# Patient Record
Sex: Female | Born: 1985
Health system: Southern US, Community
[De-identification: ages and names within clinical notes are randomized; demographics above are authoritative.]

## PROBLEM LIST (undated history)

## (undated) DIAGNOSIS — F419 Anxiety disorder, unspecified: Secondary | ICD-10-CM

## (undated) DIAGNOSIS — Z5189 Encounter for other specified aftercare: Secondary | ICD-10-CM

## (undated) DIAGNOSIS — J189 Pneumonia, unspecified organism: Secondary | ICD-10-CM

## (undated) DIAGNOSIS — D649 Anemia, unspecified: Secondary | ICD-10-CM

## (undated) DIAGNOSIS — F329 Major depressive disorder, single episode, unspecified: Secondary | ICD-10-CM

## (undated) DIAGNOSIS — Z1371 Encounter for nonprocreative screening for genetic disease carrier status: Secondary | ICD-10-CM

## (undated) DIAGNOSIS — N281 Cyst of kidney, acquired: Secondary | ICD-10-CM

## (undated) DIAGNOSIS — R011 Cardiac murmur, unspecified: Secondary | ICD-10-CM

## (undated) DIAGNOSIS — F32A Depression, unspecified: Secondary | ICD-10-CM

## (undated) DIAGNOSIS — T7840XA Allergy, unspecified, initial encounter: Secondary | ICD-10-CM

## (undated) DIAGNOSIS — Z1379 Encounter for other screening for genetic and chromosomal anomalies: Secondary | ICD-10-CM

## (undated) DIAGNOSIS — Z803 Family history of malignant neoplasm of breast: Secondary | ICD-10-CM

## (undated) DIAGNOSIS — Z9189 Other specified personal risk factors, not elsewhere classified: Secondary | ICD-10-CM

## (undated) DIAGNOSIS — M2420 Disorder of ligament, unspecified site: Secondary | ICD-10-CM

## (undated) HISTORY — DX: Family history of malignant neoplasm of breast: Z80.3

## (undated) HISTORY — DX: Other specified personal risk factors, not elsewhere classified: Z91.89

## (undated) HISTORY — DX: Encounter for other screening for genetic and chromosomal anomalies: Z13.79

## (undated) HISTORY — DX: Anxiety disorder, unspecified: F41.9

## (undated) HISTORY — DX: Depression, unspecified: F32.A

## (undated) HISTORY — DX: Allergy, unspecified, initial encounter: T78.40XA

## (undated) HISTORY — PX: DILATION AND CURETTAGE OF UTERUS: SHX78

## (undated) HISTORY — DX: Encounter for nonprocreative screening for genetic disease carrier status: Z13.71

## (undated) HISTORY — DX: Encounter for other specified aftercare: Z51.89

## (undated) HISTORY — DX: Cardiac murmur, unspecified: R01.1

## (undated) HISTORY — DX: Major depressive disorder, single episode, unspecified: F32.9

## (undated) HISTORY — PX: TUBAL LIGATION: SHX77

---
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
## 2007-02-19 ENCOUNTER — Emergency Department: Payer: Self-pay | Admitting: Internal Medicine

## 2007-09-11 ENCOUNTER — Emergency Department (HOSPITAL_COMMUNITY): Admission: EM | Admit: 2007-09-11 | Discharge: 2007-09-11 | Payer: Self-pay | Admitting: Emergency Medicine

## 2007-12-10 ENCOUNTER — Emergency Department: Payer: Self-pay | Admitting: Emergency Medicine

## 2008-06-15 ENCOUNTER — Observation Stay: Payer: Self-pay

## 2008-07-20 ENCOUNTER — Observation Stay: Payer: Self-pay | Admitting: Obstetrics and Gynecology

## 2008-07-24 ENCOUNTER — Inpatient Hospital Stay: Payer: Self-pay | Admitting: Unknown Physician Specialty

## 2009-06-15 ENCOUNTER — Emergency Department: Payer: Self-pay | Admitting: Emergency Medicine

## 2010-02-23 ENCOUNTER — Emergency Department: Payer: Self-pay | Admitting: Emergency Medicine

## 2010-04-27 ENCOUNTER — Ambulatory Visit: Payer: Self-pay | Admitting: Internal Medicine

## 2010-05-25 ENCOUNTER — Ambulatory Visit: Payer: Self-pay | Admitting: Unknown Physician Specialty

## 2010-05-29 ENCOUNTER — Emergency Department: Payer: Self-pay | Admitting: Emergency Medicine

## 2010-07-19 ENCOUNTER — Emergency Department: Payer: Self-pay | Admitting: Emergency Medicine

## 2010-08-01 ENCOUNTER — Encounter: Payer: Self-pay | Admitting: Cardiovascular Disease

## 2010-08-01 ENCOUNTER — Ambulatory Visit
Admission: RE | Admit: 2010-08-01 | Discharge: 2010-08-01 | Payer: Self-pay | Source: Home / Self Care | Attending: Cardiovascular Disease | Admitting: Cardiovascular Disease

## 2010-08-01 DIAGNOSIS — R079 Chest pain, unspecified: Secondary | ICD-10-CM | POA: Insufficient documentation

## 2010-08-01 DIAGNOSIS — R002 Palpitations: Secondary | ICD-10-CM | POA: Insufficient documentation

## 2010-08-09 ENCOUNTER — Ambulatory Visit: Payer: Self-pay

## 2010-08-16 NOTE — Assessment & Plan Note (Signed)
Summary: CHEST PAINS/SAB   Visit Type:  Initial Consult Primary Provider:  None  CC:  c/o chest pressure with shortness of breath, bad headaches, nausea, and rapid pounding heart beats.  Has experienced these sypmtoms for about one month; recently for two weeks seems worse.  She feels very exhausted all the time everyday.Ebony Lewis  History of Present Illness: Ebony Lewis is a very pleasant 25 year old woman with history of general anxiety disorder, who presents for evaluation of sharp chest pain and palpitations.  She reports that over the past 2 weeks, she has felt symptoms of sharp stabbing sensations in her left upper chest. It is left of the mediastinum. It has been coming every other night, comes on rapidly, lasting for 10 minutes at a time. It is better after she lays down. She typically feels somewhat nauseous afterwards. She also has palpitations during the periods of chest pain which she describes as a hard beat with a pause followed by more hard beats.  She has had fatigue and headaches for one month. She is otherwise active with no symptoms with exertion. She has no symptoms to the daytime, only in the late afternoon and evening. She has not had symptoms like this before. She is worried that she might have a blood clot from being on birth control pills. She does smoke and has smoked for at least 6 years. She has a 103-year-old child. She reports that her sleep is good and not broken. No significant increase in stress to the past month.  EKG shows normal sinus rhythm with rate 85 beats per minute, no significant ST or T wave changes  Preventive Screening-Counseling & Management  Alcohol-Tobacco     Smoking Status: current  Current Medications (verified): 1)  Ventolin Hfa 108 (90 Base) Mcg/act Aers (Albuterol Sulfate) .... As Needed 2)  Prednisone 10 Mg Tabs (Prednisone) .... Taper Daily For Bronchitis 3)  Ortho Tri-Cyclen (28) 0.18/0.215/0.25 Mg-35 Mcg Tabs (Norgestim-Eth Estrad Triphasic)  .... Daily  Allergies (verified): No Known Drug Allergies  Past History:  Family History: Last updated: 08/01/2010 Father: Living; heart murmur, hypertension, irreg. heart beat, anxiety. 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 Started age 5 yrs old. Smokes 5-7 cigarettes weekly.  Risk Factors: Smoking Status: current (08/01/2010)  Past Medical History: asthma  Family History: Father: Living; heart murmur, hypertension, irreg. heart beat, anxiety. Mother: Living;   Maternal grandmother: CAD; fibromyalgia  Social History: Student -- Designer, multimedia; Engineer, site Program Part Time  Tobacco Use - Yes. Started age 39 yrs old. Smokes 5-7 cigarettes weekly. Smoking Status:  current  Review of Systems       The patient complains of chest pain.  The patient denies fever, weight loss, weight gain, vision loss, decreased hearing, hoarseness, syncope, dyspnea on exertion, peripheral edema, prolonged cough, abdominal pain, incontinence, muscle weakness, depression, and enlarged lymph nodes.         palpitations  Vital Signs:  Patient profile:   25 year old female Height:      69 inches Weight:      200 pounds BMI:     29.64 Pulse rate:   85 / minute BP sitting:   114 / 78  (left arm) Cuff size:   regular  Vitals Entered By: Bishop Dublin, CMA (August 01, 2010 4:00 PM)  Physical Exam  General:  Well developed, well nourished, in  no acute distress. Head:  normocephalic and atraumatic Neck:  Neck supple, no JVD. No masses, thyromegaly or abnormal cervical nodes. Lungs:  Clear bilaterally to auscultation and percussion. Heart:  Non-displaced PMI, chest non-tender; regular rate and rhythm, S1, S2 without murmurs, rubs or gallops. Carotid upstroke normal, no bruit. Pedals normal pulses. No edema, no  varicosities. Abdomen:  Bowel sounds positive; abdomen soft and non-tender without masses Msk:  Back normal, normal gait. Muscle strength and tone normal. Pulses:  pulses normal in all 4 extremities Extremities:  No clubbing or cyanosis. Neurologic:  Alert and oriented x 3. Skin:  Intact without lesions or rashes. Psych:  Normal affect.   Impression & Recommendations:  Problem # 1:  CHEST PAIN-UNSPECIFIED (ICD-786.50) etiology for her chest pain is likely not secondary to underlying coronary artery disease as she is young, few risk factors. She has been doing well until 2 weeks ago. Symptoms come on at rest, not worse with exertion. Symptoms last for a brief period of time concerning for spasm. We have suggested she try sublingual nitroglycerin. We did offer long-acting nitroglycerin, calcium channel blockers, beta blockers but she is not interested in these medications. No further testing ordered.  Her updated medication list for this problem includes:    Nitrostat 0.4 Mg Subl (Nitroglycerin) .Ebony Lewis... 1 tablet under tongue at onset of chest pain; you may repeat every 5 minutes for up to 3 doses.  Problem # 2:  PALPITATIONS (ICD-785.1) She does have palpitations that sound consistent with ectopy/PVCs or APCs when she has her chest discomfort. These are likely benign. We did offer beta blockers, estrogen blockers and she is not interested at this time.  Her updated medication list for this problem includes:    Nitrostat 0.4 Mg Subl (Nitroglycerin) .Ebony Lewis... 1 tablet under tongue at onset of chest pain; you may repeat every 5 minutes for up to 3 doses.  Patient Instructions: 1)  Your physician recommends that you follow up as needed. Call our office if symptoms have not improved in 2 weeks. 2)  Your physician has recommended you make the following change in your medication: START Nitro-stat 0.4mg  SL as needed. Prescriptions: NITROSTAT 0.4 MG SUBL (NITROGLYCERIN) 1 tablet under tongue at onset  of chest pain; you may repeat every 5 minutes for up to 3 doses.  #25 x 3   Entered by:   Lanny Hurst RN   Authorized by:   Dossie Arbour MD   Signed by:   Lanny Hurst RN on 08/01/2010   Method used:   Electronically to        [facility]  [address] [phone].* (retail)       [address]       [place], [place]  [address]       Ph: [phone] or [phone]       Fax: [phone]   RxID:   [id number]

## 2011-01-30 ENCOUNTER — Encounter: Payer: Self-pay | Admitting: Cardiovascular Disease

## 2011-04-05 LAB — POCT PREGNANCY, URINE
Operator id: 29459
Preg Test, Ur: NEGATIVE

## 2011-04-05 LAB — BASIC METABOLIC PANEL
BUN: 7
CO2: 25
Calcium: 9
Chloride: 109
Creatinine, Ser: 0.77
GFR calc Af Amer: >60
GFR calc non Af Amer: >60
Glucose, Bld: 109 — ABNORMAL HIGH
Potassium: 3.3 — ABNORMAL LOW
Sodium: 140

## 2011-04-05 LAB — RAPID URINE DRUG SCREEN, HOSP PERFORMED
Amphetamines: NOT DETECTED
Barbiturates: NOT DETECTED
Benzodiazepines: POSITIVE — AB
Cocaine: NOT DETECTED
Opiates: NOT DETECTED
Tetrahydrocannabinol: POSITIVE — AB

## 2011-04-05 LAB — ETHANOL: Alcohol, Ethyl (B): 218 — ABNORMAL HIGH

## 2012-05-25 ENCOUNTER — Ambulatory Visit: Payer: Self-pay | Admitting: Family Medicine

## 2012-06-17 ENCOUNTER — Emergency Department: Payer: Self-pay | Admitting: Emergency Medicine

## 2012-07-09 ENCOUNTER — Encounter (HOSPITAL_COMMUNITY): Payer: Self-pay | Admitting: *Deleted

## 2012-07-09 ENCOUNTER — Emergency Department (HOSPITAL_COMMUNITY): Payer: Commercial Indemnity

## 2012-07-09 ENCOUNTER — Emergency Department (HOSPITAL_COMMUNITY)
Admission: EM | Admit: 2012-07-09 | Discharge: 2012-07-10 | Disposition: A | Discharge status: Home / Self Care | Payer: Commercial Indemnity | Attending: Emergency Medicine | Admitting: Emergency Medicine

## 2012-07-09 ENCOUNTER — Emergency Department: Payer: Self-pay | Admitting: Emergency Medicine

## 2012-07-09 DIAGNOSIS — F17200 Nicotine dependence, unspecified, uncomplicated: Secondary | ICD-10-CM | POA: Insufficient documentation

## 2012-07-09 DIAGNOSIS — J45909 Unspecified asthma, uncomplicated: Secondary | ICD-10-CM

## 2012-07-09 DIAGNOSIS — Z8701 Personal history of pneumonia (recurrent): Secondary | ICD-10-CM | POA: Insufficient documentation

## 2012-07-09 DIAGNOSIS — Z331 Pregnant state, incidental: Secondary | ICD-10-CM | POA: Insufficient documentation

## 2012-07-09 DIAGNOSIS — Z79899 Other long term (current) drug therapy: Secondary | ICD-10-CM | POA: Insufficient documentation

## 2012-07-09 DIAGNOSIS — R6883 Chills (without fever): Secondary | ICD-10-CM | POA: Insufficient documentation

## 2012-07-09 DIAGNOSIS — Z8709 Personal history of other diseases of the respiratory system: Secondary | ICD-10-CM | POA: Insufficient documentation

## 2012-07-09 DIAGNOSIS — IMO0002 Reserved for concepts with insufficient information to code with codable children: Secondary | ICD-10-CM | POA: Insufficient documentation

## 2012-07-09 MED ORDER — ALBUTEROL SULFATE (5 MG/ML) 0.5% IN NEBU
5.0000 mg | INHALATION_SOLUTION | Freq: Once | RESPIRATORY_TRACT | Status: AC
  Administered 2012-07-09: 5 mg via RESPIRATORY_TRACT
  Filled 2012-07-09: qty 1

## 2012-07-09 MED ORDER — IPRATROPIUM BROMIDE 0.02 % IN SOLN
0.5000 mg | Freq: Once | RESPIRATORY_TRACT | Status: AC
  Administered 2012-07-09: 0.5 mg via RESPIRATORY_TRACT
  Filled 2012-07-09: qty 2.5

## 2012-07-09 NOTE — ED Notes (Signed)
She has had a cold since last pm.  She is an asthmatic .  She is 7 weeks preg.  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

## 2012-07-09 NOTE — ED Notes (Signed)
Pt ambulatory to desk, asking about wait, informed about current wait, pt states she cannot wait much longer and she cannot breath, no audible wheezing noted, pt has been given breathing treatments x2 for symptoms, pt speaking in full sentences, skin warm and dry.

## 2012-07-10 LAB — POCT I-STAT 3, ART BLOOD GAS (G3+)
Acid-base deficit: 7 mmol/L — ABNORMAL HIGH (ref 0.0–2.0)
Bicarbonate: 16.6 mEq/L — ABNORMAL LOW (ref 20.0–24.0)
O2 Saturation: 97 %
TCO2: 17 mmol/L (ref 0–100)
pCO2 arterial: 27.4 mmHg — ABNORMAL LOW (ref 35.0–45.0)
pH, Arterial: 7.39 (ref 7.350–7.450)
pO2, Arterial: 91 mmHg (ref 80.0–100.0)

## 2012-07-10 MED ORDER — SODIUM CHLORIDE 0.9 % IV BOLUS (SEPSIS)
1000.0000 mL | Freq: Once | INTRAVENOUS | Status: AC
  Administered 2012-07-10: 1000 mL via INTRAVENOUS

## 2012-07-10 MED ORDER — MAGNESIUM SULFATE 40 MG/ML IJ SOLN
2.0000 g | Freq: Once | INTRAMUSCULAR | Status: AC
  Administered 2012-07-10: 2 g via INTRAVENOUS
  Filled 2012-07-10: qty 50

## 2012-07-10 MED ORDER — ALBUTEROL SULFATE (5 MG/ML) 0.5% IN NEBU
2.5000 mg | INHALATION_SOLUTION | Freq: Once | RESPIRATORY_TRACT | Status: AC
  Administered 2012-07-10: 2.5 mg via RESPIRATORY_TRACT
  Filled 2012-07-10: qty 20

## 2012-07-10 MED ORDER — ALBUTEROL SULFATE HFA 108 (90 BASE) MCG/ACT IN AERS: 1.0000 | INHALATION_SPRAY | Freq: Four times a day (QID) | RESPIRATORY_TRACT | Status: DC | PRN

## 2012-07-10 MED ORDER — AZITHROMYCIN 250 MG PO TABS: 250.0000 mg | ORAL_TABLET | Freq: Every day | ORAL | Status: DC

## 2012-07-10 MED ORDER — ALBUTEROL SULFATE (5 MG/ML) 0.5% IN NEBU
5.0000 mg | INHALATION_SOLUTION | Freq: Once | RESPIRATORY_TRACT | Status: AC
  Administered 2012-07-10: 5 mg via RESPIRATORY_TRACT
  Filled 2012-07-10: qty 20

## 2012-07-10 MED ORDER — DEXAMETHASONE SODIUM PHOSPHATE 10 MG/ML IJ SOLN
10.0000 mg | Freq: Once | INTRAMUSCULAR | Status: AC
  Administered 2012-07-10: 10 mg via INTRAVENOUS
  Filled 2012-07-10: qty 1

## 2012-07-10 MED ORDER — METHYLPREDNISOLONE SODIUM SUCC 125 MG IJ SOLR: 125.0000 mg | Freq: Once | INTRAMUSCULAR | Status: DC

## 2012-07-10 NOTE — ED Notes (Addendum)
Pt returned to desk requesting another breathing treatment, audible wheezing noted, triage RN informed, pt to have next room.

## 2012-07-10 NOTE — ED Provider Notes (Addendum)
History     CSN: [id number]  Arrival date & time 07/09/12  2029   First MD Initiated Contact with Patient 07/09/12 2354      Chief Complaint  Patient presents with  . Cough    (Consider location/radiation/quality/duration/timing/severity/associated sxs/prior treatment) Patient is a 26 y.o. female presenting with cough. The history is provided by the patient.  Cough This is a new problem. The current episode started yesterday. The problem occurs constantly. The problem has been gradually worsening. The cough is non-productive. There has been no fever. Associated symptoms include chills. Pertinent negatives include no sweats and no weight loss. She has tried nothing for the symptoms. She is not a smoker. Her past medical history is significant for bronchitis and pneumonia.    Past Medical History  Diagnosis Date  . Asthma     History reviewed. No pertinent past surgical history.  Family History  Problem Relation Age of Onset  . Heart murmur Father   . Hypertension Father   . Anxiety disorder Father   . Coronary artery disease Maternal Grandmother   . Fibromyalgia Maternal Grandmother     History  Substance Use Topics  . Smoking status: Current Some Day Smoker  . Smokeless tobacco: Not on file     Comment: Started age 47. Smokes 5-7 cigs weekly   . Alcohol Use: No    OB History    Grav Para Term Preterm Abortions TAB SAB Ect Mult Living   1               Review of Systems  Constitutional: Positive for chills. Negative for weight loss.  Respiratory: Positive for cough.   All other systems reviewed and are negative.    Allergies  Review of patient's allergies indicates not on file.  Home Medications   Current Outpatient Rx  Name  Route  Sig  Dispense  Refill  . ALBUTEROL SULFATE HFA 108 (90 BASE) MCG/ACT IN AERS   Inhalation   Inhale 2 puffs into the lungs as needed.           Marland Kitchen NITROGLYCERIN 0.4 MG SL SUBL   Sublingual   Place 0.4 mg under the  tongue every 5 (five) minutes as needed.           Ebony Lewis ESTRAD TRIPHASIC 0.18/0.215/0.25 MG-35 MCG PO TABS   Oral   Take by mouth daily.           Marland Kitchen PREDNISONE 10 MG PO TABS   Oral   Take 10 mg by mouth daily. For bronchitis              BP 142/79  Pulse 121  Temp 98.6 F (37 C) (Oral)  Resp 20  SpO2 100%  Physical Exam  Constitutional: She is oriented to person, place, and time. She appears well-developed and well-nourished.  HENT:  Head: Normocephalic and atraumatic.  Eyes: Conjunctivae normal and EOM are normal. Pupils are equal, round, and reactive to light.  Neck: Normal range of motion.  Cardiovascular: Normal rate, regular rhythm and normal heart sounds.   Pulmonary/Chest: Effort normal. No respiratory distress. She has wheezes. She has no rales. She exhibits no tenderness.  Abdominal: Soft. Bowel sounds are normal.  Musculoskeletal: Normal range of motion.  Neurological: She is alert and oriented to person, place, and time.  Skin: Skin is warm and dry.  Psychiatric: She has a normal mood and affect. Her behavior is normal.    ED Course  Procedures (including critical  care time)  Labs Reviewed - No data to display Dg Chest 2 View  07/09/2012  *RADIOLOGY REPORT*  Clinical Data: 26 year old female with cough and shortness of breath.  CHEST - 2 VIEW  Comparison: None  Findings: The cardiomediastinal silhouette is unremarkable. Mild peribronchial thickening is noted. There is no evidence of focal airspace disease, pulmonary edema, suspicious pulmonary nodule/mass, pleural effusion, or pneumothorax. No acute bony abnormalities are identified.  IMPRESSION: Mild peribronchial thickening / bronchitis of uncertain chronicity - no evidence of pneumonia.   Original Report Authenticated By: Harmon Pier, M.D.      No diagnosis found.    MDM  + asthma,  Will steroids,  Mag, ibr,  Reassess.  CXR neg.  Pt is pregnant.      Improved.  Will dc to oupt fu,   Ret new/worsening sxs    Ebony Kristiansen Lytle Michaels, MD 07/10/12 0013  Ebony Fogg Lytle Michaels, MD 07/10/12 2130

## 2012-07-11 ENCOUNTER — Emergency Department: Payer: Self-pay | Admitting: Emergency Medicine

## 2012-07-11 LAB — RAPID INFLUENZA A&B ANTIGENS

## 2013-02-18 ENCOUNTER — Observation Stay: Payer: Self-pay | Admitting: Advanced Practice Midwife

## 2013-02-23 ENCOUNTER — Observation Stay: Payer: Self-pay | Admitting: Obstetrics and Gynecology

## 2013-02-26 ENCOUNTER — Inpatient Hospital Stay: Payer: Self-pay | Admitting: Obstetrics and Gynecology

## 2013-02-26 LAB — CBC WITH DIFFERENTIAL/PLATELET
Basophil #: 0.1 10*3/uL (ref 0.0–0.1)
Basophil %: 0.5 %
Eosinophil #: 0.7 10*3/uL (ref 0.0–0.7)
Eosinophil %: 5.4 %
HCT: 36.2 % (ref 35.0–47.0)
HGB: 12.6 g/dL (ref 12.0–16.0)
Lymphocyte #: 2.7 10*3/uL (ref 1.0–3.6)
Lymphocyte %: 20.9 %
MCH: 31.6 pg (ref 26.0–34.0)
MCHC: 34.8 g/dL (ref 32.0–36.0)
MCV: 91 fL (ref 80–100)
Monocyte #: 1.3 x10 3/mm — ABNORMAL HIGH (ref 0.2–0.9)
Monocyte %: 9.8 %
Neutrophil #: 8.1 10*3/uL — ABNORMAL HIGH (ref 1.4–6.5)
Neutrophil %: 63.4 %
Platelet: 216 10*3/uL (ref 150–440)
RBC: 3.99 10*6/uL (ref 3.80–5.20)
RDW: 13.1 % (ref 11.5–14.5)
WBC: 12.8 10*3/uL — ABNORMAL HIGH (ref 3.6–11.0)

## 2013-02-26 LAB — HEMATOCRIT
HCT: 30.1 % — ABNORMAL LOW (ref 35.0–47.0)
HCT: 22.2 % — ABNORMAL LOW (ref 35.0–47.0)

## 2013-02-27 LAB — CBC WITH DIFFERENTIAL/PLATELET
Basophil #: 0 10*3/uL (ref 0.0–0.1)
Basophil %: 0.2 %
Eosinophil #: 0 10*3/uL (ref 0.0–0.7)
Eosinophil %: 0.1 %
HCT: 23.5 % — ABNORMAL LOW (ref 35.0–47.0)
HGB: 8.4 g/dL — ABNORMAL LOW (ref 12.0–16.0)
Lymphocyte #: 2.2 10*3/uL (ref 1.0–3.6)
Lymphocyte %: 11.5 %
MCH: 31.3 pg (ref 26.0–34.0)
MCHC: 35.6 g/dL (ref 32.0–36.0)
MCV: 88 fL (ref 80–100)
Monocyte #: 1.6 x10 3/mm — ABNORMAL HIGH (ref 0.2–0.9)
Monocyte %: 8.2 %
Neutrophil #: 15.6 10*3/uL — ABNORMAL HIGH (ref 1.4–6.5)
Neutrophil %: 80 %
Platelet: 163 10*3/uL (ref 150–440)
RBC: 2.68 10*6/uL — ABNORMAL LOW (ref 3.80–5.20)
RDW: 14.6 % — ABNORMAL HIGH (ref 11.5–14.5)
WBC: 19.5 10*3/uL — ABNORMAL HIGH (ref 3.6–11.0)

## 2013-02-28 LAB — HEMATOCRIT: HCT: 20.8 % — ABNORMAL LOW (ref 35.0–47.0)

## 2013-03-01 LAB — PATHOLOGY REPORT

## 2013-03-02 LAB — HEMATOCRIT: HCT: 26.3 % — ABNORMAL LOW (ref 35.0–47.0)

## 2013-04-18 ENCOUNTER — Emergency Department: Payer: Self-pay | Admitting: Emergency Medicine

## 2013-04-18 LAB — RAPID INFLUENZA A&B ANTIGENS

## 2013-04-19 LAB — COMPREHENSIVE METABOLIC PANEL
Albumin: 4.1 g/dL (ref 3.4–5.0)
Alkaline Phosphatase: 137 U/L — ABNORMAL HIGH (ref 50–136)
Anion Gap: 9 (ref 7–16)
BUN: 9 mg/dL (ref 7–18)
Bilirubin,Total: 0.2 mg/dL (ref 0.2–1.0)
Calcium, Total: 9 mg/dL (ref 8.5–10.1)
Chloride: 111 mmol/L — ABNORMAL HIGH (ref 98–107)
Co2: 23 mmol/L (ref 21–32)
Creatinine: 0.86 mg/dL (ref 0.60–1.30)
EGFR (African American): >60
EGFR (Non-African Amer.): >60
Glucose: 81 mg/dL (ref 65–99)
Osmolality: 283 (ref 275–301)
Potassium: 3.7 mmol/L (ref 3.5–5.1)
SGOT(AST): 25 U/L (ref 15–37)
SGPT (ALT): 25 U/L (ref 12–78)
Sodium: 143 mmol/L (ref 136–145)
Total Protein: 7.6 g/dL (ref 6.4–8.2)

## 2013-04-19 LAB — CBC
HCT: 40.8 % (ref 35.0–47.0)
HGB: 13.9 g/dL (ref 12.0–16.0)
MCH: 30.2 pg (ref 26.0–34.0)
MCHC: 34.1 g/dL (ref 32.0–36.0)
MCV: 89 fL (ref 80–100)
Platelet: 330 10*3/uL (ref 150–440)
RBC: 4.59 10*6/uL (ref 3.80–5.20)
RDW: 13.4 % (ref 11.5–14.5)
WBC: 11 10*3/uL (ref 3.6–11.0)

## 2013-04-22 ENCOUNTER — Emergency Department: Payer: Self-pay | Admitting: Emergency Medicine

## 2013-04-22 LAB — CBC
HCT: 40.2 % (ref 35.0–47.0)
HGB: 14.1 g/dL (ref 12.0–16.0)
MCH: 30.8 pg (ref 26.0–34.0)
MCHC: 35.2 g/dL (ref 32.0–36.0)
MCV: 88 fL (ref 80–100)
Platelet: 397 10*3/uL (ref 150–440)
RBC: 4.59 10*6/uL (ref 3.80–5.20)
RDW: 13.2 % (ref 11.5–14.5)
WBC: 8.1 10*3/uL (ref 3.6–11.0)

## 2013-04-22 LAB — BASIC METABOLIC PANEL
Anion Gap: 6 — ABNORMAL LOW (ref 7–16)
BUN: 8 mg/dL (ref 7–18)
Calcium, Total: 9.6 mg/dL (ref 8.5–10.1)
Chloride: 109 mmol/L — ABNORMAL HIGH (ref 98–107)
Co2: 23 mmol/L (ref 21–32)
Creatinine: 0.97 mg/dL (ref 0.60–1.30)
EGFR (African American): >60
EGFR (Non-African Amer.): >60
Glucose: 154 mg/dL — ABNORMAL HIGH (ref 65–99)
Osmolality: 277 (ref 275–301)
Potassium: 3.7 mmol/L (ref 3.5–5.1)
Sodium: 138 mmol/L (ref 136–145)

## 2013-04-22 LAB — HCG, QUANTITATIVE, PREGNANCY: Beta Hcg, Quant.: <1 m[IU]/mL — ABNORMAL LOW

## 2013-04-22 LAB — TROPONIN I: Troponin-I: <0.02 ng/mL

## 2013-08-05 ENCOUNTER — Encounter: Payer: Self-pay | Admitting: Cardiovascular Disease

## 2013-08-05 ENCOUNTER — Encounter: Payer: Self-pay | Admitting: Interventional Cardiology

## 2013-08-05 ENCOUNTER — Ambulatory Visit (HOSPITAL_COMMUNITY): Payer: 59 | Attending: Cardiovascular Disease

## 2013-08-05 ENCOUNTER — Ambulatory Visit (INDEPENDENT_AMBULATORY_CARE_PROVIDER_SITE_OTHER): Payer: 59 | Admitting: Interventional Cardiology

## 2013-08-05 VITALS — BP 100/50 | HR 80

## 2013-08-05 DIAGNOSIS — M79609 Pain in unspecified limb: Secondary | ICD-10-CM

## 2013-08-05 DIAGNOSIS — Z8249 Family history of ischemic heart disease and other diseases of the circulatory system: Secondary | ICD-10-CM | POA: Insufficient documentation

## 2013-08-05 DIAGNOSIS — R0602 Shortness of breath: Secondary | ICD-10-CM | POA: Insufficient documentation

## 2013-08-05 DIAGNOSIS — Z87891 Personal history of nicotine dependence: Secondary | ICD-10-CM | POA: Insufficient documentation

## 2013-08-05 DIAGNOSIS — M7989 Other specified soft tissue disorders: Secondary | ICD-10-CM | POA: Insufficient documentation

## 2013-08-05 DIAGNOSIS — R609 Edema, unspecified: Secondary | ICD-10-CM

## 2013-08-05 DIAGNOSIS — R079 Chest pain, unspecified: Secondary | ICD-10-CM

## 2013-08-05 DIAGNOSIS — R109 Unspecified abdominal pain: Secondary | ICD-10-CM

## 2013-08-05 NOTE — Patient Instructions (Signed)
Your physician has requested that you have a lower or upper extremity venous duplex. This test is an ultrasound of the veins in the legs or arms. It looks at venous blood flow that carries blood from the heart to the legs or arms. Allow one hour for a Lower Venous exam. Allow thirty minutes for an Upper Venous exam. There are no restrictions or special instructions.

## 2013-08-05 NOTE — Progress Notes (Signed)
Patient ID: Ebony Lewis, female   DOB: 17-Jul-1985, 28 y.o.   MRN: [id number]    [facility], [place], [place]  [address] Phone: [phone] Fax:  [phone]  Date:  08/05/2013   ID:  Ebony Lewis, DOB October 06, 1985, MRN [id number]  PCP:  Default, Provider, MD      History of Present Illness: Ebony Lewis is a 28 y.o. female  Who had a severe hemorrhage postpartum 5 months ago.  Elevated D-dimer a month ago.  She had a CT which was negative for PE.   Earlier today, she developed a severe headache.  She then noted some sharp chest pains.  There were multiple waves of chest pain.  It has improved.  She was concerned that her BP is only 100/50.  She feels dizzy.  No h/o DVT.  Strong family h/o cancer.     Wt Readings from Last 3 Encounters:  08/01/10 200 lb (90.719 kg)     Past Medical History  Diagnosis Date  . Asthma     Current Outpatient Prescriptions  Medication Sig Dispense Refill  . albuterol (PROVENTIL HFA;VENTOLIN HFA) 108 (90 BASE) MCG/ACT inhaler Inhale 2 puffs into the lungs every 6 (six) hours as needed. For wheeze or shortness of breath      . albuterol (PROVENTIL HFA;VENTOLIN HFA) 108 (90 BASE) MCG/ACT inhaler Inhale 1-2 puffs into the lungs every 6 (six) hours as needed for wheezing.  1 Inhaler  0  . albuterol (VENTOLIN HFA) 108 (90 BASE) MCG/ACT inhaler Inhale 2 puffs into the lungs as needed.        Marland Kitchen azithromycin (ZITHROMAX) 250 MG tablet Take 1 tablet (250 mg total) by mouth daily. Take first 2 tablets together, then 1 every day until finished.  6 tablet  0  . nitroGLYCERIN (NITROSTAT) 0.4 MG SL tablet Place 0.4 mg under the tongue every 5 (five) minutes as needed.        . ondansetron (ZOFRAN) 8 MG tablet Take by mouth every 8 (eight) hours as needed. For nausea      . Prenatal Vit-Fe Fumarate-FA (PRENATAL MULTIVITAMIN) TABS Take 1 tablet by mouth daily. With fish oil       No current facility-administered medications for this visit.    Allergies:    No Known Allergies  Social History:  The patient  reports that she has been smoking.  She does not have any smokeless tobacco history on file. She reports that she does not drink alcohol.   Family History:  The patient's family history includes Anxiety disorder in her father; Coronary artery disease in her maternal grandmother; Fibromyalgia in her maternal grandmother; Heart murmur in her father; Hypertension in her father.   ROS:  Please see the history of present illness.  No nausea, vomiting.  No fevers, chills.  No focal weakness.  No dysuria.    All other systems reviewed and negative.   PHYSICAL EXAM: VS:  BP 100/50  Pulse 80 Well nourished, well developed, in no acute distress HEENT: normal Neck: no JVD, no carotid bruits Cardiac:  normal S1, S2; RRR;  Lungs:  clear to auscultation bilaterally, no wheezing, rhonchi or rales Abd: no guarding, nondistended. soft Ext: no edema Skin: warm and dry Neuro:   no focal abnormalities noted  EKG:  NSR, no ischemic changes    ASSESSMENT AND PLAN:  1. Chest pain: Doubt coronary ischemia.  Normal ECG>  She has had some intermittent swelling in her left leg.  She  is concerned that her BP is low for her.  Recheck by me showed 100/58.   2. Edema: WOuld plan for LE venous Doppler to r/o DVT.  She already has a known positive D-dimer.  She is on birth control through an implant in her left arm.  This may increase her risk of DVT. 3. Family h/o CAD: She has stopped smoking already. 4. Abdominal pain.  Lower abdominal pain that she had since her uterine hemorrhage a few months ago.SHe is in the process of seeing a new OB/GYN.  I recommended that she schedule that appt quickly.  No problems eating or drinking.  Signed, Mina Marble, MD, [facility] 08/05/2013 3:53 PM

## 2013-08-19 ENCOUNTER — Ambulatory Visit (INDEPENDENT_AMBULATORY_CARE_PROVIDER_SITE_OTHER): Payer: 59 | Admitting: Physician Assistant

## 2013-08-19 ENCOUNTER — Emergency Department (HOSPITAL_COMMUNITY)
Admission: EM | Admit: 2013-08-19 | Discharge: 2013-08-19 | Discharge status: Absent without leave | Payer: 59 | Source: Home / Self Care

## 2013-08-19 VITALS — BP 102/60 | HR 79 | Temp 97.8°F | Ht 69.25 in | Wt 189.2 lb

## 2013-08-19 DIAGNOSIS — J329 Chronic sinusitis, unspecified: Secondary | ICD-10-CM

## 2013-08-19 DIAGNOSIS — B379 Candidiasis, unspecified: Secondary | ICD-10-CM

## 2013-08-19 MED ORDER — FLUCONAZOLE 150 MG PO TABS: 150.0000 mg | ORAL_TABLET | Freq: Once | ORAL | Status: DC

## 2013-08-19 MED ORDER — AMOXICILLIN-POT CLAVULANATE 875-125 MG PO TABS: 1.0000 | ORAL_TABLET | Freq: Two times a day (BID) | ORAL | Status: DC

## 2013-08-19 MED ORDER — GUAIFENESIN ER 1200 MG PO TB12: 1.0000 | ORAL_TABLET | Freq: Two times a day (BID) | ORAL | Status: AC

## 2013-08-19 NOTE — Progress Notes (Signed)
   Subjective:    Patient ID: Ebony Lewis, female    DOB: June 25, 1986, 28 y.o.   MRN: [id number]  HPI Pt presents to clinic with concerns that she has a sinus infection.  She had sinus symptoms several months ago and was treated with PEN VK but never felt like her symptoms got better.  She has face pain mainly over her right eye.  Some dizziness but no teeth pain.  OTC meds- tylenol and motrin for headache  Review of Systems  Constitutional: Negative for fever and chills.  HENT: Positive for congestion, postnasal drip, rhinorrhea (green) and sinus pressure. Negative for sore throat.   Respiratory: Negative for cough.   Neurological: Positive for dizziness and headaches.       Objective:   Physical Exam  Vitals reviewed. Constitutional: She appears well-developed and well-nourished.  HENT:  Head: Normocephalic and atraumatic.  Right Ear: Hearing, tympanic membrane, external ear and ear canal normal.  Left Ear: Hearing, tympanic membrane, external ear and ear canal normal.  Nose: Mucosal edema (red) present.  Mouth/Throat: Uvula is midline, oropharynx is clear and moist and mucous membranes are normal.  Eyes: Conjunctivae are normal.  Neck: Normal range of motion.  Cardiovascular: Normal rate, regular rhythm and normal heart sounds.   Pulmonary/Chest: Effort normal and breath sounds normal.  Skin: Skin is warm and dry.  Psychiatric: She has a normal mood and affect. Her behavior is normal. Judgment and thought content normal.       Assessment & Plan:  Sinus infection - Plan: Guaifenesin (MUCINEX MAXIMUM STRENGTH) 1200 MG TB12, amoxicillin-clavulanate (AUGMENTIN) 875-125 MG per tablet  Yeast infection - Plan: fluconazole (DIFLUCAN) 150 MG tablet  Windell Hummingbird PA-C 08/19/2013 9:01 PM

## 2013-11-16 ENCOUNTER — Ambulatory Visit (INDEPENDENT_AMBULATORY_CARE_PROVIDER_SITE_OTHER): Payer: 59 | Admitting: Nurse Practitioner

## 2013-11-16 VITALS — BP 100/70 | HR 80 | Ht 69.0 in | Wt 187.6 lb

## 2013-11-16 DIAGNOSIS — R21 Rash and other nonspecific skin eruption: Secondary | ICD-10-CM

## 2013-11-16 NOTE — Progress Notes (Signed)
   Hoover Browns Date of Birth: January 08, 1986 Medical Record #[id number]  History of Present Illness: Patient presents with a rash - mostly on the right hand but now starting on the left. Using multiple creams with no response. No fever. Feels nauseated.   Current Outpatient Prescriptions  Medication Sig Dispense Refill  . albuterol (VENTOLIN HFA) 108 (90 BASE) MCG/ACT inhaler Inhale 2 puffs into the lungs as needed.        . etonogestrel (NEXPLANON) 68 MG IMPL implant Inject 1 each into the skin once.       No current facility-administered medications for this visit.    No Known Allergies  Past Medical History  Diagnosis Date  . Asthma   . Allergy   . Anxiety   . Blood transfusion without reported diagnosis   . Depression   . Heart murmur     Past Surgical History  Procedure Laterality Date  . Dilation and curettage of uterus      EMERGENCY    History  Smoking status  . Former Smoker  Smokeless tobacco  . Not on file    Comment: Started age 27. Smokes 5-7 cigs weekly     History  Alcohol Use No    Family History  Problem Relation Age of Onset  . Heart murmur Father   . Hypertension Father   . Anxiety disorder Father   . Hyperlipidemia Father   . Coronary artery disease Maternal Grandmother   . Fibromyalgia Maternal Grandmother   . Cancer Maternal Grandmother   . Cancer Mother   . Diabetes Paternal Grandfather   . Heart disease Paternal Grandfather     Review of Systems: The review of systems is per the HPI.  All other systems were reviewed and are negative.  Physical Exam: BP 100/70  Pulse 80  Ht 5\' 9"  (1.753 m)  Wt 187 lb 9.6 oz (85.095 kg)  BMI 27.69 kg/m2    LABORATORY DATA: N/A  Assessment / Plan: 1. Rash - possible eczema - will give steroid dose pack. Refer to dermatology.   Patient is agreeable to this plan and will call if any problems develop in the interim.   Burtis Junes, RN, [facility] [address] [place], [place]  [address] [phone]

## 2013-11-16 NOTE — Patient Instructions (Signed)
We will refer you to dermatology.

## 2013-12-16 ENCOUNTER — Ambulatory Visit: Payer: Self-pay | Admitting: Obstetrics and Gynecology

## 2013-12-16 LAB — CBC
HCT: 40.6 % (ref 35.0–47.0)
HGB: 13.7 g/dL (ref 12.0–16.0)
MCH: 30.6 pg (ref 26.0–34.0)
MCHC: 33.8 g/dL (ref 32.0–36.0)
MCV: 91 fL (ref 80–100)
Platelet: 290 10*3/uL (ref 150–440)
RBC: 4.47 10*6/uL (ref 3.80–5.20)
RDW: 12.5 % (ref 11.5–14.5)
WBC: 8.8 10*3/uL (ref 3.6–11.0)

## 2013-12-23 ENCOUNTER — Ambulatory Visit: Payer: Self-pay | Admitting: Obstetrics and Gynecology

## 2013-12-24 ENCOUNTER — Emergency Department: Payer: Self-pay

## 2013-12-24 LAB — LIPASE, BLOOD: Lipase: 140 U/L (ref 73–393)

## 2013-12-24 LAB — HEPATIC FUNCTION PANEL A (ARMC)
Albumin: 4.1 g/dL (ref 3.4–5.0)
Alkaline Phosphatase: 90 U/L
Bilirubin, Direct: 0.1 mg/dL (ref 0.00–0.20)
Bilirubin,Total: 0.5 mg/dL (ref 0.2–1.0)
SGOT(AST): 25 U/L (ref 15–37)
SGPT (ALT): 18 U/L (ref 12–78)
Total Protein: 7.5 g/dL (ref 6.4–8.2)

## 2013-12-24 LAB — BASIC METABOLIC PANEL
Anion Gap: 8 (ref 7–16)
BUN: 6 mg/dL — ABNORMAL LOW (ref 7–18)
Calcium, Total: 8.9 mg/dL (ref 8.5–10.1)
Chloride: 107 mmol/L (ref 98–107)
Co2: 25 mmol/L (ref 21–32)
Creatinine: 0.77 mg/dL (ref 0.60–1.30)
EGFR (African American): >60
EGFR (Non-African Amer.): >60
Glucose: 83 mg/dL (ref 65–99)
Osmolality: 276 (ref 275–301)
Potassium: 3.5 mmol/L (ref 3.5–5.1)
Sodium: 140 mmol/L (ref 136–145)

## 2013-12-24 LAB — CBC
HCT: 41.4 % (ref 35.0–47.0)
HGB: 13.9 g/dL (ref 12.0–16.0)
MCH: 30.7 pg (ref 26.0–34.0)
MCHC: 33.5 g/dL (ref 32.0–36.0)
MCV: 92 fL (ref 80–100)
Platelet: 315 10*3/uL (ref 150–440)
RBC: 4.51 10*6/uL (ref 3.80–5.20)
RDW: 12.7 % (ref 11.5–14.5)
WBC: 11.8 10*3/uL — ABNORMAL HIGH (ref 3.6–11.0)

## 2013-12-24 LAB — D-DIMER(ARMC): D-Dimer: 509 ng/ml

## 2013-12-25 ENCOUNTER — Emergency Department: Payer: Self-pay | Admitting: Emergency Medicine

## 2013-12-25 LAB — CBC
HCT: 36.9 % (ref 35.0–47.0)
HGB: 12.5 g/dL (ref 12.0–16.0)
MCH: 30.7 pg (ref 26.0–34.0)
MCHC: 33.9 g/dL (ref 32.0–36.0)
MCV: 91 fL (ref 80–100)
Platelet: 294 10*3/uL (ref 150–440)
RBC: 4.08 10*6/uL (ref 3.80–5.20)
RDW: 12.6 % (ref 11.5–14.5)
WBC: 9.5 10*3/uL (ref 3.6–11.0)

## 2014-05-06 ENCOUNTER — Ambulatory Visit (INDEPENDENT_AMBULATORY_CARE_PROVIDER_SITE_OTHER): Payer: 59 | Admitting: Physician Assistant

## 2014-05-06 VITALS — BP 120/68 | HR 92 | Temp 98.1°F | Resp 18 | Ht 69.0 in | Wt 193.0 lb

## 2014-05-06 DIAGNOSIS — F329 Major depressive disorder, single episode, unspecified: Secondary | ICD-10-CM

## 2014-05-06 DIAGNOSIS — Z87898 Personal history of other specified conditions: Secondary | ICD-10-CM | POA: Insufficient documentation

## 2014-05-06 DIAGNOSIS — Z8669 Personal history of other diseases of the nervous system and sense organs: Secondary | ICD-10-CM

## 2014-05-06 DIAGNOSIS — F32A Depression, unspecified: Secondary | ICD-10-CM

## 2014-05-06 DIAGNOSIS — G4700 Insomnia, unspecified: Secondary | ICD-10-CM

## 2014-05-06 DIAGNOSIS — F419 Anxiety disorder, unspecified: Secondary | ICD-10-CM

## 2014-05-06 DIAGNOSIS — J0100 Acute maxillary sinusitis, unspecified: Secondary | ICD-10-CM

## 2014-05-06 MED ORDER — ALPRAZOLAM 0.25 MG PO TABS: 0.2500 mg | ORAL_TABLET | Freq: Two times a day (BID) | ORAL | Status: DC | PRN

## 2014-05-06 MED ORDER — ESCITALOPRAM OXALATE 10 MG PO TABS: 10.0000 mg | ORAL_TABLET | Freq: Every day | ORAL | Status: DC

## 2014-05-06 MED ORDER — FLUCONAZOLE 150 MG PO TABS: 150.0000 mg | ORAL_TABLET | Freq: Once | ORAL | Status: DC

## 2014-05-06 MED ORDER — GUAIFENESIN ER 1200 MG PO TB12: 1.0000 | ORAL_TABLET | Freq: Two times a day (BID) | ORAL | Status: AC

## 2014-05-06 MED ORDER — AMOXICILLIN-POT CLAVULANATE 875-125 MG PO TABS: 1.0000 | ORAL_TABLET | Freq: Two times a day (BID) | ORAL | Status: DC

## 2014-05-06 MED ORDER — TRAZODONE HCL 50 MG PO TABS: 25.0000 mg | ORAL_TABLET | Freq: Every evening | ORAL | Status: DC | PRN

## 2014-05-06 NOTE — Patient Instructions (Addendum)
  Start Trazodone at 25mg  for 4 nights and then increase the dose up to 100mg  bu only increase the dose by 25mg  every 4 days as needed to effect

## 2014-05-06 NOTE — Progress Notes (Signed)
Subjective:    Patient ID: Ebony Lewis, female    DOB: 19-Apr-1986, 28 y.o.   MRN: [id number]  HPI Pt presents to clinic with sinus pressure and congestion.  She feels like this is the same as when I saw her in February and the treatment helped her.  She has had symptoms for about a week and they seem to be getting worse.  She is has sinus congestion and rhinorrhea.  She has asthma and with this illness has had to use her albuterol inhaler at times but it works.  She is also concerned about her mood - her mom was diagnosed with lung cancer at 25 y/o diagnosed stage 4 and died age 30 last year.  Her mom was a non-smoker and the the mother oncologist was telling the patient that a lot of patient is non smokers lung cancer present with recurrent sinus infections and that is making her worried.  She has also noticed that sine her mother was diagnosed she has been battling with depression and anxiety.  She was started on Zoloft and Xanax at her mom diagnosis but she had to stop them after 3 weeks because she found out she was pregnant.  She is not sure the Zoloft had time to work but the Xanax did help when she was having a really bad day.  She is not sleeping well has trouble turning off her mind and stop worrying about things. She is stress eating and gaining weight.  She is also having these episodes of staring out in space and is worried she is having abcense seizures. People she work with have witnessed these events and then afterwards have told her they cannot get her attention.  She feels that she is aware of then she happen afterwards.  She admits that these episodes are worse when she is stressed out.  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  She is not having headaches associated with the time frame of these episodes her current headaches are sinus related. 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  She is religious and finds comfort in going to church and when she gets really depressed she tries to remember that other people have it much worse than her and this helps her with her mood.  Review of Systems  HENT: Positive for congestion, postnasal drip, rhinorrhea (yellow/green) and sinus pressure.   Respiratory: Positive for cough.   Neurological: Positive for headaches.  Psychiatric/Behavioral: Positive for sleep disturbance and dysphoric mood. The patient is nervous/anxious.        Objective:   Physical Exam  Vitals reviewed. Constitutional: She is oriented to person, place, and time. She appears well-developed and well-nourished.  BP 120/68  Pulse 92  Temp(Src) 98.1 F (36.7 C) (Oral)  Resp 18  Ht 5\' 9"  (1.753 m)  Wt 193 lb (87.544 kg)  BMI 28.49 kg/m2  SpO2 99%  Breastfeeding? No   HENT:  Head: Normocephalic and atraumatic.  Right Ear: Hearing, tympanic membrane, external ear and ear canal normal.  Left Ear: Hearing, tympanic membrane, external ear and ear canal normal.  Nose: Mucosal edema (pale ? polyp on the left) present. Right sinus exhibits maxillary sinus tenderness. Left sinus exhibits maxillary sinus tenderness.  Mouth/Throat: Uvula is midline, oropharynx is clear and moist and mucous membranes are normal.  Cardiovascular:  Normal rate, regular rhythm and normal heart sounds.   Pulmonary/Chest: Effort normal. She has wheezes (Right lower lung end expiratory wheezing).  Neurological: She is alert and oriented to person, place, and time.  Skin: Skin is warm and dry.  Psychiatric: Her behavior is normal. Judgment and thought content normal.  Tearful at times       Assessment & Plan:  History of seizures - I am thinking it is possible that these events are panic like attacks and with her increased stress and decreased sleep she is having a more difficult time focusing.  She has this history of a remote single seizure as a teenager and  a referral to neurology is requested at this visit.  I think this is reasonable at this time.  Plan: Ambulatory referral to Neurology  Acute maxillary sinusitis, recurrence not specified - I think it related to her allergies - she is worried because her mother's history but I think that at this time this is a simple sinus infection from under treated allergies.  Plan: amoxicillin-clavulanate (AUGMENTIN) 875-125 MG per tablet, fluconazole (DIFLUCAN) 150 MG tablet, Guaifenesin (MUCINEX MAXIMUM STRENGTH) 1200 MG TB12  Depression - pt plans to see grief counselor through [facility] and I suggested she speak to her minister for some counseling also in regards to her mother's death.  I believe this patient is depressed and she would like to try counseling before we start a SSRI medication.  She would like to see the neurologist to make sure everything is ok also to see if the stress related to these events is increasing her depressed mood.  Insomnia - Pt needs to sleep - her sleep deprivation is making her depressed mood and anxiety worse.  Plan: traZODone (DESYREL) 50 MG tablet, ALPRAZolam (XANAX) 0.25 MG tablet  Anxiety - She states that she has episode about 2x/month where she cannot gather herself and these have worked in the past.   We discussed that this is not a long term medication and if her use increases over time we will have to change our treatment plan.  I expect these to last several months as the patients only plans on taking then rarely.  Plan: ALPRAZolam (XANAX) 0.25 MG tablet  She will f/u with me in about 4 weeks unless she needs me sooner.  Windell Hummingbird PA-C  [facility] 05/07/2014 10:37 AM

## 2014-05-26 ENCOUNTER — Ambulatory Visit: Payer: 59 | Admitting: Neurology

## 2014-05-27 ENCOUNTER — Encounter: Payer: Self-pay | Admitting: Cardiovascular Disease

## 2014-05-27 ENCOUNTER — Ambulatory Visit: Payer: 59 | Admitting: Cardiovascular Disease

## 2014-06-28 ENCOUNTER — Ambulatory Visit (INDEPENDENT_AMBULATORY_CARE_PROVIDER_SITE_OTHER): Payer: 59 | Admitting: Physician Assistant

## 2014-06-28 VITALS — BP 122/70 | HR 90 | Temp 98.1°F | Resp 16 | Ht 68.0 in | Wt 194.6 lb

## 2014-06-28 DIAGNOSIS — B379 Candidiasis, unspecified: Secondary | ICD-10-CM

## 2014-06-28 DIAGNOSIS — T3695XA Adverse effect of unspecified systemic antibiotic, initial encounter: Secondary | ICD-10-CM

## 2014-06-28 DIAGNOSIS — J4540 Moderate persistent asthma, uncomplicated: Secondary | ICD-10-CM | POA: Insufficient documentation

## 2014-06-28 DIAGNOSIS — J0100 Acute maxillary sinusitis, unspecified: Secondary | ICD-10-CM

## 2014-06-28 DIAGNOSIS — J302 Other seasonal allergic rhinitis: Secondary | ICD-10-CM

## 2014-06-28 MED ORDER — FLUCONAZOLE 150 MG PO TABS: 150.0000 mg | ORAL_TABLET | Freq: Once | ORAL | Status: DC

## 2014-06-28 MED ORDER — LEVOFLOXACIN 500 MG PO TABS: 500.0000 mg | ORAL_TABLET | Freq: Every day | ORAL | Status: DC

## 2014-06-28 NOTE — Progress Notes (Signed)
Subjective:    Patient ID: Ebony Lewis, female    DOB: February 19, 1986, 28 y.o.   MRN: [id number]  HPI  Pt presents to clinic with 2 week h/o sinus problems.  Started with cold symptoms and she used OTC meds to help with her symptoms but they have continued and have worsened to the point of headaches and sinus pressure and bilateral upper teeth pain.  She feels more congested on the right side.  Her asthma is not really acting up currently.  She has a long history of allergies and was on injections as a child.  Currently she finds that Zyrtec helps the most compared with Claritin and she takes the [facility] but does not feel like she gets much relief from that.  She has been on singulair in the past without much help.  She takes her [facility] daily.   She has not filled the Xanax yet.  Felt like she wanted to hold onto that Rx.  She has had increased stress recently with finding out her grandmother has cancer.  She canceled her neurology appt because of this news but she plans to reschedule it in the spring.  Patient Active Problem List   Diagnosis Date Noted  . History of seizures 05/06/2014  . Abdominal pain 08/05/2013  . Edema 08/05/2013  . Family history of ischemic heart disease 08/05/2013  . PALPITATIONS 08/01/2010  . CHEST PAIN-UNSPECIFIED 08/01/2010   Prior to Admission medications   Medication Sig Start Date End Date Taking? Authorizing Provider  albuterol (VENTOLIN HFA) 108 (90 BASE) MCG/ACT inhaler Inhale 2 puffs into the lungs as needed.     Yes Historical Provider, MD  fluticasone (FLONASE) 50 MCG/ACT nasal spray Place 2 sprays into both nostrils as needed for allergies or rhinitis.   Yes Historical Provider, MD  mometasone-formoterol (DULERA) 100-5 MCG/ACT AERO Inhale 2 puffs into the lungs 2 (two) times daily.   Yes Historical Provider, MD  ALPRAZolam (XANAX) 0.25 MG tablet Take 1 tablet (0.25 mg total) by mouth 2 (two) times daily as needed for anxiety. Patient not taking:  Reported on 06/28/2014 05/06/14   Mancel Bale, PA-C  loratadine (CLARITIN) 10 MG tablet Take 10 mg by mouth daily.    Historical Provider, MD   No Known Allergies  Medications, allergies, past medical history, surgical history, family history, social history and problem list reviewed and updated.    Review of Systems  Constitutional: Positive for chills. Negative for fever.  HENT: Positive for congestion, postnasal drip, rhinorrhea (yellow) and sore throat. Negative for ear pain and hearing loss.   Respiratory: Positive for cough (dry at this time but feels that she has chest congestion) and shortness of breath (mild). Negative for wheezing.   Neurological: Positive for headaches. Negative for dizziness.       Objective:   Physical Exam  Constitutional: She is oriented to person, place, and time. She appears well-developed and well-nourished.  BP 122/70 mmHg  Pulse 90  Temp(Src) 98.1 F (36.7 C) (Oral)  Resp 16  Ht 5\' 8"  (1.727 m)  Wt 194 lb 9.6 oz (88.27 kg)  BMI 29.60 kg/m2  SpO2 98%   HENT:  Head: Normocephalic and atraumatic.  Right Ear: Hearing, tympanic membrane, external ear and ear canal normal.  Left Ear: Hearing, tympanic membrane, external ear and ear canal normal.  Nose: Mucosal edema (very swollen on the right and red) and rhinorrhea (clear) present. Right sinus exhibits maxillary sinus tenderness. Left sinus exhibits maxillary sinus tenderness.  Mouth/Throat: Uvula is midline, oropharynx is clear and moist and mucous membranes are normal.  Eyes: Conjunctivae are normal.  Neck: Normal range of motion.  Cardiovascular: Normal rate, regular rhythm and normal heart sounds.   Pulmonary/Chest: Effort normal. She has wheezes (slight expiratory wheezing right lung).  Lymphadenopathy:       Head (right side): No tonsillar and no occipital adenopathy present.       Head (left side): No tonsillar and no occipital adenopathy present.    She has cervical adenopathy (TTP  bilaterally).       Right cervical: Superficial cervical and posterior cervical adenopathy present.       Left cervical: Superficial cervical and posterior cervical adenopathy present.       Right: No supraclavicular adenopathy present.       Left: No supraclavicular adenopathy present.  Neurological: She is alert and oriented to person, place, and time.  Skin: Skin is warm and dry.  Psychiatric: She has a normal mood and affect. Her behavior is normal. Judgment and thought content normal.        Assessment & Plan:  Acute maxillary sinusitis, recurrence not specified - Plan: Ambulatory referral to ENT, levofloxacin (LEVAQUIN) 500 MG tablet  Asthma, moderate persistent, uncomplicated - Plan: Ambulatory referral to ENT  Seasonal allergies - Plan: Ambulatory referral to ENT  Antibiotic-induced yeast infection - Plan: fluconazole (DIFLUCAN) 150 MG tablet   With patients h/o chronic allergies that are resistant to treatment she may need to be evaluated with an allergist but at this time we will start with ENT because she has had 3 sinus infections in the last year and she has a questionable polyp on the right side.  She plans to reschedule with the neurologist in the spring and she will do that.  Windell Hummingbird PA-C  [facility] 06/28/2014 3:03 PM

## 2014-08-08 ENCOUNTER — Ambulatory Visit: Payer: Self-pay | Admitting: Obstetrics and Gynecology

## 2014-08-23 ENCOUNTER — Other Ambulatory Visit (INDEPENDENT_AMBULATORY_CARE_PROVIDER_SITE_OTHER): Payer: 59 | Admitting: *Deleted

## 2014-08-23 ENCOUNTER — Other Ambulatory Visit: Payer: Self-pay | Admitting: Nurse Practitioner

## 2014-08-23 DIAGNOSIS — R5383 Other fatigue: Secondary | ICD-10-CM

## 2014-08-23 LAB — CBC WITH DIFFERENTIAL/PLATELET
Basophils Absolute: 0.1 10*3/uL (ref 0.0–0.1)
Basophils Relative: 0.9 % (ref 0.0–3.0)
Eosinophils Absolute: 0.8 10*3/uL — ABNORMAL HIGH (ref 0.0–0.7)
Eosinophils Relative: 8.2 % — ABNORMAL HIGH (ref 0.0–5.0)
HCT: 37.1 % (ref 36.0–46.0)
Hemoglobin: 12.7 g/dL (ref 12.0–15.0)
Lymphocytes Relative: 27.2 % (ref 12.0–46.0)
Lymphs Abs: 2.8 10*3/uL (ref 0.7–4.0)
MCHC: 34.3 g/dL (ref 30.0–36.0)
MCV: 87.6 fl (ref 78.0–100.0)
Monocytes Absolute: 0.9 10*3/uL (ref 0.1–1.0)
Monocytes Relative: 8.7 % (ref 3.0–12.0)
Neutro Abs: 5.7 10*3/uL (ref 1.4–7.7)
Neutrophils Relative %: 55 % (ref 43.0–77.0)
Platelets: 322 10*3/uL (ref 150.0–400.0)
RBC: 4.24 Mil/uL (ref 3.87–5.11)
RDW: 12.6 % (ref 11.5–15.5)
WBC: 10.3 10*3/uL (ref 4.0–10.5)

## 2014-08-23 LAB — BASIC METABOLIC PANEL
BUN: 11 mg/dL (ref 6–23)
CO2: 29 mEq/L (ref 19–32)
Calcium: 9.2 mg/dL (ref 8.4–10.5)
Chloride: 106 mEq/L (ref 96–112)
Creatinine, Ser: 0.76 mg/dL (ref 0.40–1.20)
GFR: 97.09 mL/min (ref >60.00)
Glucose, Bld: 66 mg/dL — ABNORMAL LOW (ref 70–99)
Potassium: 3.9 mEq/L (ref 3.5–5.1)
Sodium: 137 mEq/L (ref 135–145)

## 2014-08-23 LAB — LIPID PANEL
Cholesterol: 128 mg/dL (ref 0–200)
HDL: 44.5 mg/dL (ref >39.00)
LDL Cholesterol: 60 mg/dL (ref 0–99)
NonHDL: 83.5
Total CHOL/HDL Ratio: 3
Triglycerides: 117 mg/dL (ref 0.0–149.0)
VLDL: 23.4 mg/dL (ref 0.0–40.0)

## 2014-08-23 LAB — TSH: TSH: 0.93 u[IU]/mL (ref 0.35–4.50)

## 2014-08-23 LAB — HEPATIC FUNCTION PANEL
ALT: 13 U/L (ref 0–35)
AST: 18 U/L (ref 0–37)
Albumin: 4.2 g/dL (ref 3.5–5.2)
Alkaline Phosphatase: 75 U/L (ref 39–117)
Bilirubin, Direct: 0.1 mg/dL (ref 0.0–0.3)
Total Bilirubin: 0.4 mg/dL (ref 0.2–1.2)
Total Protein: 6.9 g/dL (ref 6.0–8.3)

## 2014-08-23 LAB — VITAMIN D 25 HYDROXY (VIT D DEFICIENCY, FRACTURES): VITD: 31.18 ng/mL (ref 30.00–100.00)

## 2014-08-23 LAB — FERRITIN: Ferritin: 13.2 ng/mL (ref 10.0–291.0)

## 2014-08-24 ENCOUNTER — Other Ambulatory Visit: Payer: Self-pay | Admitting: Nurse Practitioner

## 2014-08-24 ENCOUNTER — Other Ambulatory Visit (INDEPENDENT_AMBULATORY_CARE_PROVIDER_SITE_OTHER): Payer: 59 | Admitting: *Deleted

## 2014-08-24 DIAGNOSIS — E162 Hypoglycemia, unspecified: Secondary | ICD-10-CM

## 2014-08-24 DIAGNOSIS — R1011 Right upper quadrant pain: Secondary | ICD-10-CM

## 2014-08-24 LAB — AMYLASE: Amylase: 64 U/L (ref 27–131)

## 2014-08-24 LAB — HEMOGLOBIN A1C: Hgb A1c MFr Bld: 5.2 % (ref 4.6–6.5)

## 2014-08-24 LAB — LIPASE: Lipase: 43 U/L (ref 11.0–59.0)

## 2014-08-25 ENCOUNTER — Ambulatory Visit
Admission: RE | Admit: 2014-08-25 | Discharge: 2014-08-25 | Disposition: A | Discharge status: Home / Self Care | Payer: 59 | Source: Ambulatory Visit | Attending: Cardiovascular Disease | Admitting: Cardiovascular Disease

## 2014-08-25 DIAGNOSIS — R1011 Right upper quadrant pain: Secondary | ICD-10-CM

## 2014-09-09 ENCOUNTER — Encounter (HOSPITAL_COMMUNITY): Payer: Self-pay

## 2014-09-09 ENCOUNTER — Emergency Department (HOSPITAL_COMMUNITY)
Admission: EM | Admit: 2014-09-09 | Discharge: 2014-09-09 | Disposition: A | Discharge status: Home / Self Care | Payer: 59 | Source: Home / Self Care | Attending: Family Medicine | Admitting: Family Medicine

## 2014-09-09 DIAGNOSIS — J322 Chronic ethmoidal sinusitis: Secondary | ICD-10-CM | POA: Diagnosis not present

## 2014-09-09 MED ORDER — AMOXICILLIN-POT CLAVULANATE 875-125 MG PO TABS: 1.0000 | ORAL_TABLET | Freq: Two times a day (BID) | ORAL | Status: DC

## 2014-09-09 NOTE — ED Provider Notes (Signed)
CSN: [id number]     Arrival date & time 09/09/14  1134 History   First MD Initiated Contact with Patient 09/09/14 1209     Chief Complaint  Patient presents with  . Facial Pain   (Consider location/radiation/quality/duration/timing/severity/associated sxs/prior Treatment) HPI Comments: 29 year old female states that she has days no other sinus infection. She has had 6 or 7 this season. She has been on multiple antibiotics for "all of her life and she has developed a resistance to them. She has not seen an ENT in the past but does have an appointment coming up next month. She is complaining of facial pressure primarily to the right side her nose is constantly stopped up she has had congestion, PND, sore throat and right-sided headache. She has been taking pseudoephedrine, Zyrtec, Flonase and Excedrin Migraine. She was sent over by her cardiologist told him that she had a sinus infection needed to be treated at urgent care.      Past Medical History  Diagnosis Date  . Asthma   . Allergy   . Anxiety   . Blood transfusion without reported diagnosis   . Depression   . Heart murmur    Past Surgical History  Procedure Laterality Date  . Dilation and curettage of uterus      EMERGENCY  . Tubal ligation     Family History  Problem Relation Age of Onset  . Heart murmur Father   . Hypertension Father   . Anxiety disorder Father   . Hyperlipidemia Father   . Coronary artery disease Maternal Grandmother   . Fibromyalgia Maternal Grandmother   . Cancer Maternal Grandmother   . Cancer Mother   . Diabetes Paternal Grandfather   . Heart disease Paternal Grandfather    History  Substance Use Topics  . Smoking status: Former Research scientist (life sciences)  . Smokeless tobacco: Not on file     Comment: Started age 12. Smokes 5-7 cigs weekly   . Alcohol Use: No   OB History    Gravida Para Term Preterm AB TAB SAB Ectopic Multiple Living   1              Review of Systems  Constitutional: Positive for  activity change. Negative for fever, chills, appetite change and fatigue.  HENT: Positive for congestion, postnasal drip, rhinorrhea, sinus pressure and sore throat. Negative for facial swelling.   Eyes: Negative.   Respiratory: Negative.  Negative for choking and shortness of breath.   Cardiovascular: Negative.   Gastrointestinal: Negative.   Musculoskeletal: Negative for neck pain and neck stiffness.  Skin: Negative for pallor and rash.  Neurological: Negative.     Allergies  Review of patient's allergies indicates no known allergies.  Home Medications   Prior to Admission medications   Medication Sig Start Date End Date Taking? Authorizing Provider  albuterol (VENTOLIN HFA) 108 (90 BASE) MCG/ACT inhaler Inhale 2 puffs into the lungs as needed.      Historical Provider, MD  ALPRAZolam Duanne Moron) 0.25 MG tablet Take 1 tablet (0.25 mg total) by mouth 2 (two) times daily as needed for anxiety. Patient not taking: Reported on 06/28/2014 05/06/14   Mancel Bale, PA-C  amoxicillin-clavulanate (AUGMENTIN) 875-125 MG per tablet Take 1 tablet by mouth every 12 (twelve) hours. 09/09/14   Janne Napoleon, NP  fluconazole (DIFLUCAN) 150 MG tablet Take 1 tablet (150 mg total) by mouth once. Repeat in 1 week is needed 06/28/14   Mancel Bale, PA-C  fluticasone [facility]) 50 MCG/ACT nasal spray  Place 2 sprays into both nostrils as needed for allergies or rhinitis.    Historical Provider, MD  levofloxacin (LEVAQUIN) 500 MG tablet Take 1 tablet (500 mg total) by mouth daily. 06/28/14   Mancel Bale, PA-C  loratadine (CLARITIN) 10 MG tablet Take 10 mg by mouth daily.    Historical Provider, MD  mometasone-formoterol (DULERA) 100-5 MCG/ACT AERO Inhale 2 puffs into the lungs 2 (two) times daily.    Historical Provider, MD   LMP 09/08/2014 Physical Exam  Constitutional: She is oriented to person, place, and time. She appears well-developed and well-nourished. No distress.  HENT:  Bilateral TMs are mildly  retracted. Pearly gray, transparent no apparent effusion. No erythema. Oropharynx with minor erythema much cobblestoning and clear PND.  Eyes: Conjunctivae and EOM are normal.  Neck: Normal range of motion. Neck supple.  Cardiovascular: Normal rate, regular rhythm and normal heart sounds.   Pulmonary/Chest: Effort normal and breath sounds normal. No respiratory distress. She has no wheezes. She has no rales.  Lymphadenopathy:    She has no cervical adenopathy.  Neurological: She is alert and oriented to person, place, and time.  Skin: Skin is warm and dry.  Psychiatric: She has a normal mood and affect.  Nursing note and vitals reviewed.   ED Course  Procedures (including critical care time) Labs Review Labs Reviewed - No data to display  Imaging Review No results found.   MDM   1. Chronic ethmoidal sinusitis    augmentin 875 Cont OTC meds for sx's Keep appt with ENT    Janne Napoleon, NP 09/09/14 1225

## 2014-09-09 NOTE — Discharge Instructions (Signed)

## 2014-09-09 NOTE — ED Notes (Signed)
C/o 9 day duration of facial pain and pressure. Reportedly seen by one of the MD's on her job that told her she has a sinus infection, and sent here here for a Rx

## 2014-09-13 ENCOUNTER — Ambulatory Visit (INDEPENDENT_AMBULATORY_CARE_PROVIDER_SITE_OTHER): Payer: 59

## 2014-09-13 ENCOUNTER — Telehealth: Payer: Self-pay

## 2014-09-13 ENCOUNTER — Ambulatory Visit (INDEPENDENT_AMBULATORY_CARE_PROVIDER_SITE_OTHER): Payer: 59 | Admitting: Family Medicine

## 2014-09-13 VITALS — BP 116/64 | HR 88 | Temp 98.6°F | Resp 16 | Ht 69.0 in | Wt 197.0 lb

## 2014-09-13 DIAGNOSIS — R05 Cough: Secondary | ICD-10-CM

## 2014-09-13 DIAGNOSIS — R059 Cough, unspecified: Secondary | ICD-10-CM

## 2014-09-13 DIAGNOSIS — R0602 Shortness of breath: Secondary | ICD-10-CM | POA: Diagnosis not present

## 2014-09-13 DIAGNOSIS — J209 Acute bronchitis, unspecified: Secondary | ICD-10-CM

## 2014-09-13 DIAGNOSIS — R062 Wheezing: Secondary | ICD-10-CM

## 2014-09-13 LAB — POCT CBC
Granulocyte percent: 75.9 %G (ref 37–80)
HCT, POC: 40.3 % (ref 37.7–47.9)
Hemoglobin: 13.2 g/dL (ref 12.2–16.2)
Lymph, poc: 2.2 (ref 0.6–3.4)
MCH, POC: 30 pg (ref 27–31.2)
MCHC: 32.8 g/dL (ref 31.8–35.4)
MCV: 91.4 fL (ref 80–97)
MID (cbc): 0.7 (ref 0–0.9)
MPV: 6.9 fL (ref 0–99.8)
POC Granulocyte: 9.1 — AB (ref 2–6.9)
POC LYMPH PERCENT: 18 %L (ref 10–50)
POC MID %: 6.1 %M (ref 0–12)
Platelet Count, POC: 350 10*3/uL (ref 142–424)
RBC: 4.41 M/uL (ref 4.04–5.48)
RDW, POC: 13.5 %
WBC: 12 10*3/uL — AB (ref 4.6–10.2)

## 2014-09-13 MED ORDER — MOMETASONE FURO-FORMOTEROL FUM 100-5 MCG/ACT IN AERO: 2.0000 | INHALATION_SPRAY | Freq: Two times a day (BID) | RESPIRATORY_TRACT | Status: DC

## 2014-09-13 MED ORDER — ALBUTEROL SULFATE (2.5 MG/3ML) 0.083% IN NEBU
2.5000 mg | INHALATION_SOLUTION | Freq: Once | RESPIRATORY_TRACT | Status: AC
  Administered 2014-09-13: 2.5 mg via RESPIRATORY_TRACT

## 2014-09-13 MED ORDER — AZITHROMYCIN 250 MG PO TABS: ORAL_TABLET | ORAL | Status: AC

## 2014-09-13 MED ORDER — HYDROCOD POLST-CHLORPHEN POLST 10-8 MG/5ML PO LQCR: 5.0000 mL | Freq: Two times a day (BID) | ORAL | Status: DC | PRN

## 2014-09-13 MED ORDER — ALBUTEROL SULFATE HFA 108 (90 BASE) MCG/ACT IN AERS: 2.0000 | INHALATION_SPRAY | RESPIRATORY_TRACT | Status: DC | PRN

## 2014-09-13 MED ORDER — IPRATROPIUM BROMIDE 0.02 % IN SOLN
0.5000 mg | Freq: Once | RESPIRATORY_TRACT | Status: AC
  Administered 2014-09-13: 0.5 mg via RESPIRATORY_TRACT

## 2014-09-13 MED ORDER — GUAIFENESIN ER 1200 MG PO TB12: 1.0000 | ORAL_TABLET | Freq: Two times a day (BID) | ORAL | Status: AC

## 2014-09-13 NOTE — Patient Instructions (Signed)
Push fluids Stop all OTC medications for colds and just use mucinex.  Use your Ruthe Mannan for 2 months to get through the allergy season.  If your wheezing is not getting better we may need to use prednisone.

## 2014-09-13 NOTE — Progress Notes (Signed)
Subjective:    Patient ID: Ebony Lewis, female    DOB: 29-Nov-1985, 29 y.o.   MRN: [id number]  HPI Pt presents to clinic with continued sinus problems and now asthma problems for the last 5 days.  She started with sinus pressure and pain and went to a different UC and was treated with Augmentin (she states this does not work for her because she has had it to much and she tried to tell the provider that).  Yesterday she started having problems with her asthma with SOB and wheezing - her albuterol inhaler is not helping and the albuterol neb only gives her relief for a short period of time.  She is feeling feverish but no documented fever.  Plans to see ENT in [place] because that is closer to where she she lives.  Review of Systems  Constitutional: Positive for fever (subjective). Negative for chills.  HENT: Positive for congestion, rhinorrhea (green and bloody) and sinus pressure. Negative for sore throat.   Respiratory: Positive for cough (thick green with blood), shortness of breath and wheezing.        H/o asthma   Psychiatric/Behavioral: Positive for sleep disturbance (2nd to cough).       Objective:   Physical Exam  Constitutional: She is oriented to person, place, and time. She appears well-developed and well-nourished.  BP 116/64 mmHg  Pulse 88  Temp(Src) 98.6 F (37 C) (Oral)  Resp 16  Ht 5\' 9"  (1.753 m)  Wt 197 lb (89.359 kg)  BMI 29.08 kg/m2  SpO2 96%  PF 200 L/min  LMP 09/08/2014   HENT:  Head: Normocephalic and atraumatic.  Right Ear: Hearing, tympanic membrane, external ear and ear canal normal.  Left Ear: Hearing, tympanic membrane, external ear and ear canal normal.  Nose: Mucosal edema (red) present.  Mouth/Throat: Uvula is midline, oropharynx is clear and moist and mucous membranes are normal.  Eyes: Conjunctivae are normal.  Neck: Normal range of motion.  Cardiovascular: Normal rate, regular rhythm and normal heart sounds.   No murmur  heard. Pulmonary/Chest: Effort normal. She has wheezes (inspiratory>expiratory - worse with forced expiration).  Pt is able to talk in very long sentences without problems.  Deep breaths start a deep cough.  Neurological: She is alert and oriented to person, place, and time.  Skin: Skin is warm and dry.  Psychiatric: She has a normal mood and affect. Her behavior is normal. Judgment and thought content normal.   Results for orders placed or performed in visit on 09/13/14  POCT CBC  Result Value Ref Range   WBC 12.0 (A) 4.6 - 10.2 K/uL   Lymph, poc 2.2 0.6 - 3.4   POC LYMPH PERCENT 18.0 10 - 50 %L   MID (cbc) 0.7 0 - 0.9   POC MID % 6.1 0 - 12 %M   POC Granulocyte 9.1 (A) 2 - 6.9   Granulocyte percent 75.9 37 - 80 %G   RBC 4.41 4.04 - 5.48 M/uL   Hemoglobin 13.2 12.2 - 16.2 g/dL   HCT, POC 40.3 37.7 - 47.9 %   MCV 91.4 80 - 97 fL   MCH, POC 30.0 27 - 31.2 pg   MCHC 32.8 31.8 - 35.4 g/dL   RDW, POC 13.5 %   Platelet Count, POC 350 142 - 424 K/uL   MPV 6.9 0 - 99.8 fL   [facility] reading (PRIMARY) by  Dr. Brigitte Pulse.  Increased bibasilar markings.  After neb pt states she does not  feel better - she has have decreased wheezing and increased ability to take deep breaths without coughing.  Her cough is much better.    Assessment & Plan:  SOB (shortness of breath) - Plan: albuterol (PROVENTIL) (2.5 MG/3ML) 0.083% nebulizer solution 2.5 mg, ipratropium (ATROVENT) nebulizer solution 0.5 mg  Wheezing - Plan: albuterol (VENTOLIN HFA) 108 (90 BASE) MCG/ACT inhaler, mometasone-formoterol (DULERA) 100-5 MCG/ACT AERO  Cough - Plan: POCT CBC, DG Chest 2 View, chlorpheniramine-HYDROcodone (TUSSIONEX PENNKINETIC ER) 10-8 MG/5ML LQCR  Acute bronchitis - Plan: azithromycin (ZITHROMAX Z-PAK) 250 MG tablet, Guaifenesin (MUCINEX MAXIMUM STRENGTH) 1200 MG TB12  I suspect that the patient is dealing with atypical bronchitis and she is having sinus problems because of under treated allergies.  She has not been on  her Ruthe Mannan recently - she states that she only uses her albuterol inhaler when she is sick.  She refused any steroid (oral and injectable) due to concern about weight gain and she would rather not be able to breath well for a few days than gain a few more pounds.  She was asked to RTC if her breathing is not improved in the next 48h.  We are going to change her abx to one that covers atypical bacteria a little better and get her something for her cough which I think is a large part of her current problems.  I think she is coughing hard which might be triggering her SOB and if we calm down her cough her SOB might improve.  She will continue to use her inhaler and nebulizer as needed.  Windell Hummingbird PA-C  [facility] 09/13/2014 10:50 AM

## 2014-09-13 NOTE — Telephone Encounter (Signed)
Pt was seen here today by Windell Hummingbird and she was given a z-pak. She would like to know if the medication she was given today will clear up her chest and nose infection. Please call back [phone]

## 2014-09-13 NOTE — Telephone Encounter (Signed)
Called pt, vm not set up yet. Yes Z-pak will clear up these infections.

## 2014-09-14 NOTE — Telephone Encounter (Signed)
Called pt, I spoke with Ebony Lewis about whether this ABX will treat both sinus and chest infections. She states this is the best treatment for both. I let pt know. Pt understood.

## 2014-09-15 ENCOUNTER — Ambulatory Visit (INDEPENDENT_AMBULATORY_CARE_PROVIDER_SITE_OTHER): Payer: 59 | Admitting: Nurse Practitioner

## 2014-09-15 ENCOUNTER — Encounter: Payer: Self-pay | Admitting: Nurse Practitioner

## 2014-09-15 VITALS — BP 118/80 | HR 87 | Temp 98.2°F | Resp 14 | Ht 69.0 in | Wt 196.4 lb

## 2014-09-15 DIAGNOSIS — M2550 Pain in unspecified joint: Secondary | ICD-10-CM

## 2014-09-15 DIAGNOSIS — Z7189 Other specified counseling: Secondary | ICD-10-CM

## 2014-09-15 DIAGNOSIS — R5382 Chronic fatigue, unspecified: Secondary | ICD-10-CM | POA: Diagnosis not present

## 2014-09-15 DIAGNOSIS — M791 Myalgia, unspecified site: Secondary | ICD-10-CM

## 2014-09-15 DIAGNOSIS — Z7689 Persons encountering health services in other specified circumstances: Secondary | ICD-10-CM

## 2014-09-15 DIAGNOSIS — Z87898 Personal history of other specified conditions: Secondary | ICD-10-CM

## 2014-09-15 DIAGNOSIS — Z8669 Personal history of other diseases of the nervous system and sense organs: Secondary | ICD-10-CM

## 2014-09-15 DIAGNOSIS — J329 Chronic sinusitis, unspecified: Secondary | ICD-10-CM

## 2014-09-15 DIAGNOSIS — R519 Headache, unspecified: Secondary | ICD-10-CM

## 2014-09-15 DIAGNOSIS — R51 Headache: Secondary | ICD-10-CM

## 2014-09-15 LAB — BASIC METABOLIC PANEL
BUN: 8 mg/dL (ref 6–23)
CO2: 28 mEq/L (ref 19–32)
Calcium: 9.8 mg/dL (ref 8.4–10.5)
Chloride: 104 mEq/L (ref 96–112)
Creatinine, Ser: 0.77 mg/dL (ref 0.40–1.20)
GFR: 95.59 mL/min (ref >60.00)
Glucose, Bld: 84 mg/dL (ref 70–99)
Potassium: 4.2 mEq/L (ref 3.5–5.1)
Sodium: 137 mEq/L (ref 135–145)

## 2014-09-15 LAB — SEDIMENTATION RATE: Sed Rate: 24 mm/hr — ABNORMAL HIGH (ref 0–22)

## 2014-09-15 LAB — C-REACTIVE PROTEIN: CRP: 1.3 mg/dL (ref 0.5–20.0)

## 2014-09-15 NOTE — Progress Notes (Signed)
Pre visit review using our clinic review tool, if applicable. No additional management support is needed unless otherwise documented below in the visit note. 

## 2014-09-15 NOTE — Progress Notes (Signed)
Subjective:    Patient ID: Ebony Lewis, female    DOB: 1985/11/27, 29 y.o.   MRN: [id number]  HPI  Ebony Lewis is a 29 yo female establishing care and multiple CC.   1) New Pt info:    Diet- Organic fresh fruits and vegetables   Exercise- 30 minutes every day                                 Immunizations- Up to date  Pap- Emergency D&C, Sonohystogram, abdnormal PAP last year, followed by Lucillie Garfinkel Exam- 2014  Dental Exam- Due   2) Chronic Problems-  2014- Had baby had emergency d&c afterwards due to full placenta not being removed.    Tubal ligation and hysteroscopy in 2015 June  3) Acute Problems-   1) Extreme fatigue, bone and muscle pain, sharp, weakening pains in all extremities 2.5 months. Bones ache at night. Family history of MS and fibromyalgia   2) ENT referral- 5 sinus infections in 1 year  3) Neurology- Self diagnosed abscense seizures 2 months ago.   Lasts a few seconds, very bad headaches/vision disturbances. No formal diagnosis   Other Providers:  Dr. Acie Fredrickson- at [facility] through church- sees them 2 x a month   Review of Systems  Constitutional: Negative for fever, chills, diaphoresis and fatigue.  HENT: Positive for sinus pressure.   Eyes: Positive for visual disturbance.  Respiratory: Positive for wheezing. Negative for chest tightness and shortness of breath.   Cardiovascular: Negative for chest pain, palpitations and leg swelling.  Gastrointestinal: Negative for nausea, vomiting, abdominal pain, diarrhea and abdominal distention.  Genitourinary: Negative for difficulty urinating and menstrual problem.  Musculoskeletal: Positive for myalgias and arthralgias.  Skin: Negative for rash.  Neurological: Positive for headaches. Negative for dizziness, weakness and numbness.  Hematological: Does not bruise/bleed easily.  Psychiatric/Behavioral: Negative for suicidal ideas and sleep disturbance. The patient is not nervous/anxious.     Past Medical History  Diagnosis Date  . Asthma   . Allergy   . Anxiety   . Blood transfusion without reported diagnosis   . Depression   . Heart murmur     History   Social History  . Marital Status: Single    Spouse Name: N/A  . Number of Children: N/A  . Years of Education: N/A   Occupational History  . Not on file.   Social History Main Topics  . Smoking status: Former Research scientist (life sciences)  . Smokeless tobacco: Former Systems developer    Quit date: 09/15/2010     Comment: Started age 25. Smokes 5-7 cigs weekly   . Alcohol Use: 0.0 oz/week    0 Standard drinks or equivalent per week     Comment: Socially   . Drug Use: No  . Sexual Activity:    Partners: Male     Comment: Fiance   Other Topics Concern  . 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 Dilation and curettage of uterus      EMERGENCY  . Tubal ligation      Family History  Problem Relation Age of Onset  . Heart murmur Father   . Hypertension Father   .  Anxiety disorder Father   . Hyperlipidemia Father   . Coronary artery disease Maternal Grandmother   . Fibromyalgia Maternal Grandmother   . Cancer Maternal Grandmother     Melanoma, cervical, ovarian  . Cancer Mother 11    Lung Cancer  . Diabetes Paternal Grandfather   . Heart disease Paternal Grandfather   . Diabetes Maternal Grandfather   . Hypertension Maternal Grandfather   . Cancer Paternal Grandmother     Stage III lung cancer  . Multiple sclerosis Paternal Grandmother     No Known Allergies  Current Outpatient Prescriptions on File Prior to Visit  Medication Sig Dispense Refill  . albuterol (VENTOLIN HFA) 108 (90 BASE) MCG/ACT inhaler Inhale 2 puffs into the lungs as needed. 1 Inhaler 1  . chlorpheniramine-HYDROcodone (TUSSIONEX PENNKINETIC ER) 10-8 MG/5ML LQCR Take 5  mLs by mouth every 12 (twelve) hours as needed for cough. 70 mL 0  . fluticasone (FLONASE) 50 MCG/ACT nasal spray Place 2 sprays into both nostrils as needed for allergies or rhinitis.    . Guaifenesin (MUCINEX MAXIMUM STRENGTH) 1200 MG TB12 Take 1 tablet (1,200 mg total) by mouth 2 (two) times daily. 14 each 0  . mometasone-formoterol (DULERA) 100-5 MCG/ACT AERO Inhale 2 puffs into the lungs 2 (two) times daily. 13 g 1   No current facility-administered medications on file prior to visit.      Objective:   Physical Exam  Constitutional: She is oriented to person, place, and time. She appears well-developed and well-nourished. No distress.  BP 118/80 mmHg  Pulse 87  Temp(Src) 98.2 F (36.8 C) (Oral)  Resp 14  Ht 5\' 9"  (1.753 m)  Wt 196 lb 6.4 oz (89.086 kg)  BMI 28.99 kg/m2  SpO2 96%  LMP 09/08/2014 (Approximate)   HENT:  Head: Normocephalic and atraumatic.  Right Ear: External ear normal.  Left Ear: External ear normal.  Eyes: Right eye exhibits no discharge. Left eye exhibits no discharge. No scleral icterus.  Neck: Normal range of motion. Neck supple. No thyromegaly present.  Cardiovascular: Normal rate, regular rhythm, normal heart sounds and intact distal pulses.  Exam reveals no gallop and no friction rub.   No murmur heard. Pulmonary/Chest: Effort normal and breath sounds normal. No respiratory distress. She has no wheezes. She has no rales. She exhibits no tenderness.  Musculoskeletal: Normal range of motion. She exhibits no edema or tenderness.  Lymphadenopathy:    She has no cervical adenopathy.  Neurological: She is alert and oriented to person, place, and time. No cranial nerve deficit. She exhibits normal muscle tone. Coordination normal.  Skin: Skin is warm and dry. No rash noted. She is not diaphoretic.  Psychiatric: Judgment and thought content normal. Her mood appears anxious. She is hyperactive.      Assessment & Plan:

## 2014-09-15 NOTE — Patient Instructions (Signed)
We will contact you about your referral to ENT and Neurology.   Please visit the lab before leaving today.

## 2014-09-16 LAB — CBC WITH DIFFERENTIAL
Basophils Absolute: 0.1 10*3/uL (ref 0.0–0.2)
Basos: 1 %
Eos: 10 %
Eosinophils Absolute: 1.1 10*3/uL — ABNORMAL HIGH (ref 0.0–0.4)
HCT: 41.1 % (ref 34.0–46.6)
Hemoglobin: 13.9 g/dL (ref 11.1–15.9)
Immature Grans (Abs): 0 10*3/uL (ref 0.0–0.1)
Immature Granulocytes: 0 %
Lymphocytes Absolute: 2.7 10*3/uL (ref 0.7–3.1)
Lymphs: 25 %
MCH: 29.9 pg (ref 26.6–33.0)
MCHC: 33.8 g/dL (ref 31.5–35.7)
MCV: 88 fL (ref 79–97)
Monocytes Absolute: 0.8 10*3/uL (ref 0.1–0.9)
Monocytes: 8 %
Neutrophils Absolute: 6.1 10*3/uL (ref 1.4–7.0)
Neutrophils Relative %: 56 %
RBC: 4.65 x10E6/uL (ref 3.77–5.28)
RDW: 13.2 % (ref 12.3–15.4)
WBC: 10.8 10*3/uL (ref 3.4–10.8)

## 2014-09-16 LAB — B. BURGDORFI ANTIBODIES: Lyme IgG/IgM Ab: <0.91 {ISR} (ref 0.00–0.90)

## 2014-09-19 ENCOUNTER — Telehealth: Payer: Self-pay

## 2014-09-19 DIAGNOSIS — J329 Chronic sinusitis, unspecified: Secondary | ICD-10-CM | POA: Insufficient documentation

## 2014-09-19 DIAGNOSIS — M2550 Pain in unspecified joint: Secondary | ICD-10-CM | POA: Insufficient documentation

## 2014-09-19 DIAGNOSIS — R059 Cough, unspecified: Secondary | ICD-10-CM

## 2014-09-19 DIAGNOSIS — Z7689 Persons encountering health services in other specified circumstances: Secondary | ICD-10-CM | POA: Insufficient documentation

## 2014-09-19 DIAGNOSIS — R05 Cough: Secondary | ICD-10-CM

## 2014-09-19 NOTE — Assessment & Plan Note (Signed)
Will check Sed rate and CRP as well as a Lyme titer due to 5 ticks being pulled off in last year. Will follow.

## 2014-09-19 NOTE — Assessment & Plan Note (Signed)
Discussed acute and chronic issues. Reviewed health maintenance measures, PFSHx, and immunizations. Obtain labs per visit today Sed Rate, CRP, Lyme, CBC w/ diff, and BMET.

## 2014-09-19 NOTE — Assessment & Plan Note (Signed)
Pt would like referral to neurology because of episodic headaches/visual disturbances and staring episodes. Will follow.

## 2014-09-19 NOTE — Telephone Encounter (Signed)
Ebony Lewis what is the next step? Looks like we referred her to ENT.  Acute maxillary sinusitis, recurrence not specified - Plan: Ambulatory referral to ENT, levofloxacin (LEVAQUIN) 500 MG tablet  Asthma, moderate persistent, uncomplicated - Plan: Ambulatory referral to ENT  Seasonal allergies - Plan: Ambulatory referral to ENT  Antibiotic-induced yeast infection - Plan: fluconazole (DIFLUCAN) 150 MG tablet   With patients h/o chronic allergies that are resistant to treatment she may need to be evaluated with an allergist but at this time we will start with ENT because she has had 3 sinus infections in the last year and she has a questionable polyp on the right side. She plans to reschedule with the neurologist in the spring and she will do that.

## 2014-09-19 NOTE — Telephone Encounter (Signed)
Ebony Lewis,   Patient would like you to call her regarding her antibiotics.  She has had two rounds of different antibiotics and is not better.  Please call  [place]

## 2014-09-19 NOTE — Assessment & Plan Note (Signed)
Referral to ENT requested due to 5 sinus infections being treated by urgent care in past year.

## 2014-09-20 NOTE — Telephone Encounter (Signed)
ENT or allergist is the next step.  When she saw me it was more of a chest problem - what is she having trouble with currently?

## 2014-09-20 NOTE — Telephone Encounter (Signed)
I tried to call pt, unable to leave voicemail.

## 2014-09-20 NOTE — Telephone Encounter (Signed)
The abx I put her on should have covered everything in her chest and it is still working for the next 3 days.  I would need to see her to add another medication.  She can continue mucinex for her congestion and cough and her albuterol for the wheezing and please make sure she is using her Dulera.  I can get her some more cough medication if she needs it.

## 2014-09-20 NOTE — Telephone Encounter (Signed)
Spoke with pt, she has an ENT appt in the works from her primary care. She wants something in the meantime for her illness. She states she is still coughing up thick green mucus from her chest and wheezing. Is there anything we can Rx until she can get in to see the ENT. Pt is still very sick. Please advise.

## 2014-09-20 NOTE — Telephone Encounter (Signed)
Spoke with pt, advised message from Judson Roch. Pt understood. I tried to get the [facility] covered on her insurance but it was denied. Can we send in another inhaler? She would like some cough medication also. Please advise.

## 2014-09-20 NOTE — Telephone Encounter (Signed)
Called pt, VM not set up. Unable to leave message.

## 2014-09-22 MED ORDER — BECLOMETHASONE DIPROPIONATE 40 MCG/ACT IN AERS: 2.0000 | INHALATION_SPRAY | Freq: Two times a day (BID) | RESPIRATORY_TRACT | Status: DC

## 2014-09-22 MED ORDER — HYDROCOD POLST-CHLORPHEN POLST 10-8 MG/5ML PO LQCR: 5.0000 mL | Freq: Two times a day (BID) | ORAL | Status: AC | PRN

## 2014-09-22 NOTE — Addendum Note (Signed)
Addended by: Mancel Bale on: 09/22/2014 09:27 AM   Modules accepted: Orders

## 2014-09-22 NOTE — Telephone Encounter (Signed)
Done - she can pick up the cough medication and I sent in Qvar.

## 2014-09-22 NOTE — Progress Notes (Signed)
Reviewed xray, documentation and agree w/ assessment and plan. Delman Cheadle, MD MPH

## 2014-09-22 NOTE — Telephone Encounter (Signed)
Pt.notified

## 2014-10-02 ENCOUNTER — Emergency Department: Payer: Self-pay | Admitting: Physician Assistant

## 2014-10-02 ENCOUNTER — Encounter (HOSPITAL_COMMUNITY): Payer: Self-pay | Admitting: *Deleted

## 2014-10-02 ENCOUNTER — Emergency Department (INDEPENDENT_AMBULATORY_CARE_PROVIDER_SITE_OTHER)
Admission: EM | Admit: 2014-10-02 | Discharge: 2014-10-02 | Disposition: A | Discharge status: Home / Self Care | Payer: 59 | Source: Home / Self Care | Attending: Family Medicine | Admitting: Family Medicine

## 2014-10-02 DIAGNOSIS — J029 Acute pharyngitis, unspecified: Secondary | ICD-10-CM

## 2014-10-02 DIAGNOSIS — J069 Acute upper respiratory infection, unspecified: Secondary | ICD-10-CM | POA: Diagnosis not present

## 2014-10-02 LAB — POCT RAPID STREP A: Streptococcus, Group A Screen (Direct): NEGATIVE

## 2014-10-02 MED ORDER — IPRATROPIUM BROMIDE 0.06 % NA SOLN: 2.0000 | Freq: Four times a day (QID) | NASAL | Status: DC

## 2014-10-02 MED ORDER — ACETAMINOPHEN 325 MG PO TABS: 975.0000 mg | ORAL_TABLET | Freq: Once | ORAL | Status: DC

## 2014-10-02 MED ORDER — ACETAMINOPHEN 325 MG PO TABS
ORAL_TABLET | ORAL | Status: AC
  Filled 2014-10-02: qty 3

## 2014-10-02 NOTE — Discharge Instructions (Signed)
Your physical exam and vital signs are reassuring. Rapid strep test negative. Will send throat swab for culture and notify you if results indicate the need for additional treatment. You have been given 975mg  oral tylenol at [facility] for pain. Atrovent nasal spray as prescribed for congestion at home. Additional symptomatic care at home with fluids, rest, tylenol and/or ibuprofen.  If symptoms worsen, PCP follow up. If symptoms suddenly worse or severe, ER re-eval.   Sore Throat A sore throat is pain, burning, irritation, or scratchiness of the throat. There is often pain or tenderness when swallowing or talking. A sore throat may be accompanied by other symptoms, such as coughing, sneezing, fever, and swollen neck glands. A sore throat is often the first sign of another sickness, such as a cold, flu, strep throat, or mononucleosis (commonly known as mono). Most sore throats go away without medical treatment. CAUSES  The most common causes of a sore throat include:  A viral infection, such as a cold, flu, or mono.  A bacterial infection, such as strep throat, tonsillitis, or whooping cough.  Seasonal allergies.  Dryness in the air.  Irritants, such as smoke or pollution.  Gastroesophageal reflux disease (GERD). HOME CARE INSTRUCTIONS   Only take over-the-counter medicines as directed by your caregiver.  Drink enough fluids to keep your urine clear or pale yellow.  Rest as needed.  Try using throat sprays, lozenges, or sucking on hard candy to ease any pain (if older than 4 years or as directed).  Sip warm liquids, such as broth, herbal tea, or warm water with honey to relieve pain temporarily. You may also eat or drink cold or frozen liquids such as frozen ice pops.  Gargle with salt water (mix 1 tsp salt with 8 oz of water).  Do not smoke and avoid secondhand smoke.  Put a cool-mist humidifier in your bedroom at night to moisten the air. You can also turn on a hot shower and sit in  the bathroom with the door closed for 5-10 minutes. SEEK IMMEDIATE MEDICAL CARE IF:  You have difficulty breathing.  You are unable to swallow fluids, soft foods, or your saliva.  You have increased swelling in the throat.  Your sore throat does not get better in 7 days.  You have nausea and vomiting.  You have a fever or persistent symptoms for more than 2-3 days.  You have a fever and your symptoms suddenly get worse. MAKE SURE YOU:   Understand these instructions.  Will watch your condition.  Will get help right away if you are not doing well or get worse. Document Released: 08/08/2004 Document Revised: 06/17/2012 Document Reviewed: 03/08/2012 [facility] Patient Information 2015 [place], [place]. This information is not intended to replace advice given to you by your health care provider. Make sure you discuss any questions you have with your health care provider.  Salt Water Gargle This solution will help make your mouth and throat feel better. HOME CARE INSTRUCTIONS   Mix 1 teaspoon of salt in 8 ounces of warm water.  Gargle with this solution as much or often as you need or as directed. Swish and gargle gently if you have any sores or wounds in your mouth.  Do not swallow this mixture. Document Released: 04/04/2004 Document Revised: 09/23/2011 Document Reviewed: 08/26/2008 [facility] Patient Information 2015 [place], [place]. This information is not intended to replace advice given to you by your health care provider. Make sure you discuss any questions you have with your health care  provider.  Strep Throat Tests While most sore throats are caused by viruses, at times they are caused by a bacteria called group A Streptococci (strep throat). It is important to determine the cause because the strep bacteria is treated with antibiotic medication. There are 2 types of tests for strep throat: a rapid strep test and a throat culture. Both tests are done by wiping a swab over the  back of the throat and then using chemicals to identify the type of bacteria present. The rapid strep test takes 10 to 20 minutes. If the rapid strep test is negative, a throat culture may be performed to confirm the results. With a throat culture, the swab is used to spread the bacteria on a gel plate and grow it in a lab, which may take 1 to 2 days. In some cases, the culture will detect strep bacteria not found with the rapid strep test. If the result of the rapid strep test is positive, no further testing is needed, and your caregiver will prescribe antibiotics. Not all test results are available during your visit. If your test results are not back during the visit, make an appointment with your caregiver to find out the results. Do not assume everything is normal if you have not heard from your caregiver or the medical facility. It is important for you to follow up on all of your test results. SEEK MEDICAL CARE IF:   Your symptoms are not improving within 1 to 2 days, or you are getting worse.  You have any other questions or concerns. SEEK IMMEDIATE MEDICAL CARE IF:   You have increased difficulty with swallowing.  You develop trouble breathing.  You have a fever. Document Released: 08/08/2004 Document Revised: 09/23/2011 Document Reviewed: 10/06/2013 [facility] Patient Information 2015 [place], [place]. This information is not intended to replace advice given to you by your health care provider. Make sure you discuss any questions you have with your health care provider.  Upper Respiratory Infection, Adult An upper respiratory infection (URI) is also sometimes known as the common cold. The upper respiratory tract includes the nose, sinuses, throat, trachea, and bronchi. Bronchi are the airways leading to the lungs. Most people improve within 1 week, but symptoms can last up to 2 weeks. A residual cough may last even longer.  CAUSES Many different viruses can infect the tissues lining the  upper respiratory tract. The tissues become irritated and inflamed and often become very moist. Mucus production is also common. A cold is contagious. You can easily spread the virus to others by oral contact. This includes kissing, sharing a glass, coughing, or sneezing. Touching your mouth or nose and then touching a surface, which is then touched by another person, can also spread the virus. SYMPTOMS  Symptoms typically develop 1 to 3 days after you come in contact with a cold virus. Symptoms vary from person to person. They may include:  Runny nose.  Sneezing.  Nasal congestion.  Sinus irritation.  Sore throat.  Loss of voice (laryngitis).  Cough.  Fatigue.  Muscle aches.  Loss of appetite.  Headache.  Low-grade fever. DIAGNOSIS  You might diagnose your own cold based on familiar symptoms, since most people get a cold 2 to 3 times a year. Your caregiver can confirm this based on your exam. Most importantly, your caregiver can check that your symptoms are not due to another disease such as strep throat, sinusitis, pneumonia, asthma, or epiglottitis. Blood tests, throat tests, and X-rays are not necessary  to diagnose a common cold, but they may sometimes be helpful in excluding other more serious diseases. Your caregiver will decide if any further tests are required. RISKS AND COMPLICATIONS  You may be at risk for a more severe case of the common cold if you smoke cigarettes, have chronic heart disease (such as heart failure) or lung disease (such as asthma), or if you have a weakened immune system. The very young and very old are also at risk for more serious infections. Bacterial sinusitis, middle ear infections, and bacterial pneumonia can complicate the common cold. The common cold can worsen asthma and chronic obstructive pulmonary disease (COPD). Sometimes, these complications can require emergency medical care and may be life-threatening. PREVENTION  The best way to protect  against getting a cold is to practice good hygiene. Avoid oral or hand contact with people with cold symptoms. Wash your hands often if contact occurs. There is no clear evidence that vitamin C, vitamin E, echinacea, or exercise reduces the chance of developing a cold. However, it is always recommended to get plenty of rest and practice good nutrition. TREATMENT  Treatment is directed at relieving symptoms. There is no cure. Antibiotics are not effective, because the infection is caused by a virus, not by bacteria. Treatment may include:  Increased fluid intake. Sports drinks offer valuable electrolytes, sugars, and fluids.  Breathing heated mist or steam (vaporizer or shower).  Eating chicken soup or other clear broths, and maintaining good nutrition.  Getting plenty of rest.  Using gargles or lozenges for comfort.  Controlling fevers with ibuprofen or acetaminophen as directed by your caregiver.  Increasing usage of your inhaler if you have asthma. Zinc gel and zinc lozenges, taken in the first 24 hours of the common cold, can shorten the duration and lessen the severity of symptoms. Pain medicines may help with fever, muscle aches, and throat pain. A variety of non-prescription medicines are available to treat congestion and runny nose. Your caregiver can make recommendations and may suggest nasal or lung inhalers for other symptoms.  HOME CARE INSTRUCTIONS   Only take over-the-counter or prescription medicines for pain, discomfort, or fever as directed by your caregiver.  Use a warm mist humidifier or inhale steam from a shower to increase air moisture. This may keep secretions moist and make it easier to breathe.  Drink enough water and fluids to keep your urine clear or pale yellow.  Rest as needed.  Return to work when your temperature has returned to normal or as your caregiver advises. You may need to stay home longer to avoid infecting others. You can also use a face mask and  careful hand washing to prevent spread of the virus. SEEK MEDICAL CARE IF:   After the first few days, you feel you are getting worse rather than better.  You need your caregiver's advice about medicines to control symptoms.  You develop chills, worsening shortness of breath, or brown or red sputum. These may be signs of pneumonia.  You develop yellow or brown nasal discharge or pain in the face, especially when you bend forward. These may be signs of sinusitis.  You develop a fever, swollen neck glands, pain with swallowing, or white areas in the back of your throat. These may be signs of strep throat. SEEK IMMEDIATE MEDICAL CARE IF:   You have a fever.  You develop severe or persistent headache, ear pain, sinus pain, or chest pain.  You develop wheezing, a prolonged cough, cough up blood, or  have a change in your usual mucus (if you have chronic lung disease).  You develop sore muscles or a stiff neck. Document Released: 12/25/2000 Document Revised: 09/23/2011 Document Reviewed: 10/06/2013 [facility] Patient Information 2015 [place], [place]. This information is not intended to replace advice given to you by your health care provider. Make sure you discuss any questions you have with your health care provider.

## 2014-10-02 NOTE — ED Notes (Signed)
C/o R earache, eye pain, and R side pain of head onset last night.  Had sore throat onset yesterday afternoon.  She took Excedrin Migraine @ 0300 with relief.  States her temp. is low grade and her head feels hot.  Nasal congestion.  She blew her nose and it was pure blood.

## 2014-10-02 NOTE — ED Notes (Signed)
PA offered to transfer pt. To [facility] ED. Pt. said she did not want to go there. She wants to be discharged and she said she will go somewhere else. Pt. would not say where she was going to go.  She said she was not going to fill her Rx. of Atrovent nasal spray.  She was crying at discharge and said she "did not like the way the provider looked at her... Like she was crazy."

## 2014-10-02 NOTE — ED Provider Notes (Signed)
CSN: [id number]     Arrival date & time 10/02/14  1113 History   First MD Initiated Contact with Patient 10/02/14 1212     Chief Complaint  Patient presents with  . URI  . 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 female presenting with URI and ear pain.  URI Presenting symptoms: congestion, ear pain, rhinorrhea and sore throat   Presenting symptoms: no cough, no facial pain, no fatigue and no fever   Severity:  Moderate Onset quality:  Gradual Duration:  1 day (symptoms began last night) Timing:  Constant Chronicity:  New Relieved by:  None tried Ineffective treatments:  None tried Associated symptoms: headaches   Associated symptoms: no arthralgias and no myalgias   Risk factors: sick contacts   Risk factors comment:  Reports all of her children have been sick with URI symptoms this week.  Otalgia Associated symptoms: congestion, headaches, rhinorrhea and sore throat   Associated symptoms: no cough and no fever     Past Medical History  Diagnosis Date  . Asthma   . Allergy   . Anxiety   . Blood transfusion without reported diagnosis   . Depression   . Heart murmur    Past Surgical History  Procedure Laterality Date  . Dilation and curettage of uterus      EMERGENCY  . Tubal ligation     Family History  Problem Relation Age of Onset  . Heart murmur Father   . Hypertension Father   . Anxiety disorder Father   . Hyperlipidemia Father   . Coronary artery disease Maternal Grandmother   . Fibromyalgia Maternal Grandmother   . Cancer Maternal Grandmother     Melanoma, cervical, ovarian  . Cancer Mother 59    Lung Cancer  . Diabetes Paternal Grandfather   . Heart disease Paternal Grandfather   . Diabetes Maternal Grandfather   .  Hypertension Maternal Grandfather   . Cancer Paternal Grandmother     Stage III lung cancer  . Multiple sclerosis Paternal Grandmother    History  Substance Use Topics  . Smoking status: Never Smoker   . Smokeless tobacco: Not on file     Comment: Started age 31. Smokes 5-7 cigs weekly   . Alcohol Use: 0.0 oz/week    0 Standard drinks or equivalent per week     Comment: Socially    OB History    Gravida Para Term Preterm AB TAB SAB Ectopic Multiple Living   1              Review of Systems  Constitutional: Negative for fever, chills and fatigue.  HENT: Positive for congestion, ear pain, nosebleeds, rhinorrhea and sore throat.        Reports single episode of bloody nasal secretions after blowing nose this morning  Eyes: Negative.   Respiratory: Negative for cough.   Cardiovascular: Negative.   Gastrointestinal: Negative.   Musculoskeletal: Negative for myalgias and arthralgias.  Skin: Negative.   Neurological: Positive for headaches.    Allergies  Amoxicillin  Home Medications   Prior to Admission medications   Medication Sig Start Date End Date Taking? Authorizing Provider  fluticasone (FLONASE) 50 MCG/ACT nasal spray Place 2 sprays into both nostrils as needed for allergies or rhinitis.   Yes Historical Provider, MD  albuterol (VENTOLIN HFA)  108 (90 BASE) MCG/ACT inhaler Inhale 2 puffs into the lungs as needed. 09/13/14   Mancel Bale, PA-C  beclomethasone (QVAR) 40 MCG/ACT inhaler Inhale 2 puffs into the lungs 2 (two) times daily. 09/22/14   Mancel Bale, PA-C  chlorpheniramine-HYDROcodone (TUSSIONEX PENNKINETIC ER) 10-8 MG/5ML LQCR Take 5 mLs by mouth every 12 (twelve) hours as needed for cough. 09/22/14 10/02/14  Mancel Bale, PA-C  ipratropium (ATROVENT) 0.06 % nasal spray Place 2 sprays into both nostrils 4 (four) times daily. For nasal congestion 10/02/14   Audelia Hives Presson, PA  mometasone-formoterol (DULERA) 100-5 MCG/ACT AERO Inhale 2 puffs into the lungs 2  (two) times daily. 09/13/14   Mancel Bale, PA-C   BP 111/80 mmHg  Pulse 100  Temp(Src) 99.3 F (37.4 C) (Oral)  Resp 18  SpO2 100%  LMP 09/08/2014 (Approximate) Physical Exam  Constitutional: She is oriented to person, place, and time. She appears well-developed and well-nourished. No distress.  HENT:  Head: Normocephalic and atraumatic.  Right Ear: Hearing, tympanic membrane and ear canal normal. No drainage or swelling. No mastoid tenderness. Tympanic membrane is not injected, not perforated, not erythematous, not retracted and not bulging. No middle ear effusion. No decreased hearing is noted.  Left Ear: Hearing, tympanic membrane, external ear and ear canal normal. No drainage or swelling. No mastoid tenderness. Tympanic membrane is not injected, not perforated, not erythematous, not retracted and not bulging.  No middle ear effusion. No decreased hearing is noted.  Nose: Mucosal edema and rhinorrhea present.  Mouth/Throat: Uvula is midline, oropharynx is clear and moist and mucous membranes are normal. No oral lesions. No trismus in the jaw. No uvula swelling.  Eyes: Conjunctivae are normal.  Neck: Normal range of motion. Neck supple.  Cardiovascular: Normal rate, regular rhythm and normal heart sounds.   Pulmonary/Chest: Effort normal and breath sounds normal. No respiratory distress. She has no wheezes.  Musculoskeletal: Normal range of motion.  Lymphadenopathy:    She has no cervical adenopathy.  Neurological: She is alert and oriented to person, place, and time.  Skin: Skin is warm and dry.  Psychiatric: She has a normal mood and affect. Her behavior is normal.  Nursing note and vitals reviewed.   ED Course  Procedures (including critical care time) Labs Review Labs Reviewed  POCT RAPID STREP A ([facility] ONLY)    Imaging Review No results found.   MDM   1. URI (upper respiratory infection)   2. Sore throat    Rapid strep test negative. Will send for culture  and notify patient if results indicate the need for additional treatment. Patient given 975mg  oral tylenol at [facility] for pain. Atrovent nasal spray for congestion at home. Will advise regarding additional symptomatic care at home with fluids, rest, tylenol and/or ibuprofen.  If symptoms worsen, PCP follow up. If symptoms suddenly worse or severe, ER re-eval.  Patient expresses concern that her symptoms area caused by something other than common cold and requests further evaluation in Emergency Room. I offered to transfer her via our [place] shuttle to [facility] and she declined stating she was not sure which facility she would like to be seen at. She requests discharge.   Lutricia Feil, [place] 10/02/14 1312

## 2014-10-05 LAB — CULTURE, GROUP A STREP: Strep A Culture: NEGATIVE

## 2014-10-13 ENCOUNTER — Telehealth: Payer: Self-pay | Admitting: Physician Assistant

## 2014-10-13 NOTE — Telephone Encounter (Signed)
[employer] faxed FMLA paperwork to be completed for patient. I called patient and she states that she has chronic sinusitis which causes her to miss work frequently. Patient states that she was out of work from 09/27/2014-09/29/2014. She also needs intermittent leave so that she will be covered from receiving points when she has to call out of work. Patient states that she is seeing an ENT specialist in April. Should they complete paperwork instead? Normally I would go ahead and fill this out but I am a little confused about how to fill this out. Her last OV with you was 06/28/2014. She saw Dr. Brigitte Pulse on 09/13/2014 for separate issues. Also, I know you will be OOO for the next week. How should we do this?  Thanks, [employer]

## 2014-10-13 NOTE — Telephone Encounter (Signed)
ENT needs to fill out for future leave.  I not see the patient around her missing work 3/15 so I can not fill out the form for that time that she missed work.  I will put the paperwork at [facility] box for her to pick-up.

## 2014-10-22 NOTE — Telephone Encounter (Signed)
Patient called today (Saturday) checking status of FMLA ppw. She didn't understand why Judson Roch couldn't complete the ppw. She kept saying that she was seen on March 15 but the last date we have in her chart is March 1st. She realized that she wrote down the wrong dates so she will contact [employer] on Monday and her employer [employer]) so new ppw can be sent with the correct dates.

## 2014-10-24 NOTE — Telephone Encounter (Addendum)
Spoke to patient and she states that she was out of work from 09/13/2014-09/15/2014. She understands and acknowledges that she gave the wrong dates of service that Judson Roch treated her. She was actually seen on 09/13/2014. [employer] has faxed over another FMLA form for Ebony Lewis to complete. Patient states that she and Judson Roch discussed intermittent leave for her chronic sinus flare ups and she has an appointment with ENT on 10/28/2014. She states that she needs this paperwork completed ASAP. ENT has not seen patient yet and she cannot wait until Friday for them to do this. She needs something to turn in to her employer by Wednesday. I have started completing some sections. i just need your review and signature.  Thanks, [employer]

## 2014-10-25 NOTE — Telephone Encounter (Signed)
I wrote a letter covering her absence on 3/1-3/3 and I need a blank FMLA because I am only going to cover those days.  She has a PCP and the ENT would be the appropriate person to fill out FMLA for intermittent leave due to her sinuses.

## 2014-11-04 NOTE — Op Note (Signed)
PATIENT NAME:  Ebony Lewis, Ebony Lewis MR#:  [id number] DATE OF BIRTH:  1986-07-08  DATE OF PROCEDURE:  02/27/2013  PREOPERATIVE DIAGNOSIS: Postpartum hemorrhage.  POSTOPERATIVE DIAGNOSES: Postpartum hemorrhage with retained placenta and banded and constriction on the uterus.   PROCEDURE: Banjo curettage with uterine exploration and placement of the Bakri balloons  inflated to approximately 400 mL.   SURGEON: Delsa Sale, M.D.   ESTIMATED BLOOD LOSS: Approximately 250, but approximately 3000 mL since the time of delivery to the time of surgery.   FINDINGS: Was approximately a 20 weeks' size uterus with a bulbous fundus that was firm;  however, it had a constricted band approximately midway through the uterus that prevented deep exploration until found and a very floppy lower uterine segment.   DESCRIPTION OF PROCEDURE: The patient was taken to the operating room and placed in supine position. After adequate general endotracheal anesthesia was instilled, the patient was prepped and draped in the usual sterile fashion. A side-opening speculum placed in the patient's vagina and found to not give good visualization. Deaver's were then used to hold up the sides. The edges of the cervix were grasped with ring forceps for visualization.  Large clots were extruding from the cervix. The patient's uterus was explored with sponge on a stick and then with the banjo curette. The banjo curette only went partway through until the circular openings to the constricted area was found and the banjo curette was placed through this and the curette of the upper frontal portion was performed. At this point, large chunks of placenta were removed. Smaller chunks of placenta were removed in the lower uterine segment, but it was quite oozy. The Bakri balloon was then placed into the ring area of the uterus and filled with fluid. It was not able to stay high in the fundus due to this ring, but was instead kept in the lower uterine  segment area and partly into the uterine fundus. Approximately 400 mL was used to inflate this. The Bakri balloon was then attached to a Foley bag, and then the patient was then cleaned up and laid supine after having tolerated the procedure well. The patient was then taken to recovery with clear urine noted in the Foley bag. The patient was then typed and crossed for 4 units of blood.     ____________________________ Delsa Sale, MD cck:nts D: 02/27/2013 00:56:00 ET T: 02/27/2013 03:48:01 ET JOB#: [id number]  cc: Delsa Sale, MD, <Dictator> Delsa Sale MD ELECTRONICALLY SIGNED 03/02/2013 23:11

## 2014-11-05 NOTE — Op Note (Signed)
PATIENT NAME:  Ebony Lewis, Ebony Lewis MR#:  [id number] DATE OF BIRTH:  [birth year]  DATE OF PROCEDURE:  12/23/2013  PREOPERATIVE DIAGNOSES: 1.  Desires permanent surgical sterilization.  2.  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   POSTOPERATIVE DIAGNOSES:   1.  Desires permanent surgical sterilization. 2.  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   PROCEDURES PERFORMED:  Hysteroscopy and bilateral tubal ligation using Falope-Rings laparoscopically.   ANESTHESIA:  General.  PRIMARY SURGEON: Stoney Bang. Georgianne Fick, M.D.   ESTIMATED BLOOD LOSS:  Minimal.   OPERATIVE FLUIDS:  850 mL of crystalloid.   URINE OUTPUT:  30 mL.   PREOPERATIVE ANTIBIOTICS:  None.   COMPLICATIONS:  None.   DRAINS OR TUBES:  None.   IMPLANTS:  None.   INTRAOPERATIVE FINDINGS:  The patient had previously experienced a postpartum hemorrhage requiring a D and C, as well as Bakri balloon placement. Since that time, she has experienced irregular painful menses and an ultrasound obtained previously appeared to show findings consistent with possible intrauterine synechiae raising the concern for Asherman syndrome. The patient also did not wish to become pregnant in the future, as she felt her last delivery was too traumatic. The hysteroscopy portion of the case revealed a normal uterine cavity. No evidence of uterine synechiae or Asherman syndrome. The abdominal portion of the case revealed normal intra-abdominal anatomy, also with no clear etiology for the patient's symptoms. The Falope-Rings after application contained a good 1 cm knuckle of blanching tube each.   SPECIMENS REMOVED:  None.   The patient's condition following procedure is stable.     PROCEDURE IN DETAIL:  Risks, benefits and alternatives of the procedure were discussed with the patient prior to proceeding to the operating room. The permanent nature of tubal ligation was discussed with the patient previously in the office. The patient was taken to  the operating room where she was placed under general endotracheal anesthesia. She was positioned in the dorsal lithotomy position utilizing Allen stirrups, prepped and draped in the usual sterile fashion. Attention was turned to the patient's pelvis. The bladder was straight cath'd with a red rubber catheter. An operative speculum was then placed. The anterior lip of the cervix was visualized, grasped with a single-tooth tenaculum and serially dilated to allow passage using Pratt dilators to allow passage of the hysteroscope. Upon advancing the hysteroscope into the endometrial cavity, the endometrial cavity was noted to be normal in appearance without any pathology. Attention was then turned to the patient's abdomen. The umbilicus was infiltrated with 1% plain lidocaine. A stab incision was made at the base of the umbilicus. A 5 mm XL trocar was then used to gain  direct entry into the peritoneal cavity under direct visualization. Following placement of the 5 mm trocar and establishment of pneumoperitoneum, the 8 mm Falope-Ring applicator disposable trocar was placed suprapubically under direct visualization. The left tube was identified. A mid isthmic portion was grasped with the Falope-Ring applicator and the Falope-Ring was applied noting a good 1 cm knuckle of blanching tube after application. The right tube was then also walked out to the mid isthmic portion and a second Falope-Ring was placed on the right. Following placement of the Falope-Rings on the right and left, the remainder of the abdomen was inspected and noted to be without abnormality. Pneumoperitoneum was evacuated and the trocars were removed. The 8 mm trocar site was closed with 4-0 Monocryl in subcuticular fashion. Both trocar sites were then dressed with Dermabond. Sponge, needle and instrument counts  were correct x 2. The patient tolerated the procedure well and was taken to the recovery room in stable condition.     ____________________________ Stoney Bang. Georgianne Fick, MD ams:dmm D: 12/24/2013 09:21:00 ET T: 12/24/2013 09:40:00 ET JOB#: [id number]  cc: Stoney Bang. Georgianne Fick, MD, <Dictator> Conan Bowens Madelon Lips MD ELECTRONICALLY SIGNED 01/04/2014 9:13

## 2014-11-13 HISTORY — PX: NASAL SINUS SURGERY: SHX719

## 2014-11-23 ENCOUNTER — Encounter: Payer: Self-pay | Admitting: *Deleted

## 2014-11-23 NOTE — Anesthesia Preprocedure Evaluation (Addendum)
Anesthesia Evaluation  Patient identified by MRN, date of birth, ID band  Reviewed: Allergy & Precautions, H&P , NPO status , Patient's Chart, lab work & pertinent test results  Airway Mallampati: II  TM Distance: >3 FB Neck ROM: full    Dental no notable dental hx.    Pulmonary asthma , former smoker,    Pulmonary exam normal       Cardiovascular Rhythm:regular Rate:Normal     Neuro/Psych    GI/Hepatic   Endo/Other    Renal/GU      Musculoskeletal   Abdominal   Peds  Hematology   Anesthesia Other Findings   Reproductive/Obstetrics                            Anesthesia Physical Anesthesia Plan  ASA: II  Anesthesia Plan: General ETT   Post-op Pain Management:    Induction:   Airway Management Planned:   Additional Equipment:   Intra-op Plan:   Post-operative Plan:   Informed Consent: I have reviewed the patients History and Physical, chart, labs and discussed the procedure including the risks, benefits and alternatives for the proposed anesthesia with the patient or authorized representative who has indicated his/her understanding and acceptance.     Plan Discussed with: CRNA  Anesthesia Plan Comments:        Anesthesia Quick Evaluation

## 2014-11-28 NOTE — Discharge Instructions (Signed)
[facility] °[facility] °ENDOSCOPIC SINUS SURGERY °[facility] ° °What is Functional Endoscopic Sinus Surgery? ° The Surgery involves making the natural openings of the sinuses larger by removing the bony partitions that separate the sinuses from the nasal cavity.  The natural sinus lining is preserved as much as possible to allow the sinuses to resume normal function after the surgery.  In some patients nasal polyps (excessively swollen lining of the sinuses) may be removed to relieve obstruction of the sinus openings.  The surgery is performed through the nose using lighted scopes, which eliminates the need for incisions on the face.  A septoplasty is a different procedure which is sometimes performed with sinus surgery.  It involves straightening the boy partition that separates the two sides of your nose.  A crooked or deviated septum may need repair if is obstructing the sinuses or nasal airflow.  Turbinate reduction is also often performed during sinus surgery.  The turbinates are bony proturberances from the side walls of the nose which swell and can obstruct the nose in patients with sinus and allergy problems.  Their size can be surgically reduced to help relieve nasal obstruction. ° °What Can Sinus Surgery Do For Me? ° Sinus surgery can reduce the frequency of sinus infections requiring antibiotic treatment.  This can provide improvement in nasal congestion, post-nasal drainage, facial pressure and nasal obstruction.  Surgery will NOT prevent you from ever having an infection again, so it usually only for patients who get infections 4 or more times yearly requiring antibiotics, or for infections that do not clear with antibiotics.  It will not cure nasal allergies, so patients with allergies may still require medication to treat their allergies after surgery. Surgery may improve headaches related to sinusitis, however, some people will continue to  require medication to control sinus headaches related to allergies.  Surgery will do nothing for other forms of headache (migraine, tension or cluster). °What Are the Risks of Endoscopic Sinus Surgery? ° Current techniques allow surgery to be performed safely with little risk, however, there are rare complications that patients should be aware of.  Because the sinuses are located around the eyes, there is risk of eye injury, including blindness, though again, this would be quite rare. This is usually a result of bleeding behind the eye during surgery, which puts the vision oat risk, though there are treatments to protect the vision and prevent permanent disrupted by surgery causing a leak of the spinal fluid that surrounds the brain.  More serious complications would include bleeding inside the brain cavity or damage to the brain.  Again, all of these complications are uncommon, and spinal fluid leaks can be safely managed surgically if they occur.  The most common complication of sinus surgery is bleeding from the nose, which may require packing or cauterization of the nose.  Continued sinus have polyps may experience recurrence of the polyps requiring revision surgery.  Alterations of sense of smell or injury to the tear ducts are also rare complications.  °What is the Surgery Like, and what is the Recovery? ° The Surgery usually takes a couple of hours to perform, and is usually performed under a general anesthetic (completely asleep).  Patients are usually discharged home after a couple of hours.  Sometimes during surgery it is necessary to pack the nose to control bleeding, and the packing is left in place for 24 - 48 hours, and removed by your surgeon.  If   a septoplasty was performed during the procedure, there is often a splint placed which must be removed after 5-7 days.   °Discomfort: Pain is usually mild to moderate, and can be controlled by prescription pain medication or acetaminophen (Tylenol).   Aspirin, Ibuprofen (Advil, Motrin), or Naprosyn (Aleve) should be avoided, as they can cause increased bleeding.  Most patients feel sinus pressure like they have a bad head cold for several days.  Sleeping with your head elevated can help reduce swelling and facial pressure, as can ice packs over the face.  A humidifier may be helpful to keep the mucous and blood from drying in the nose.  °Diet: There are no specific diet restrictions, however, you should generally start with clear liquids and a light diet of bland foods because the anesthetic can cause some nausea.  Advance your diet depending on how your stomach feels.  Taking your pain medication with food will often help reduce stomach upset which pain medications can cause. ° °Nasal Saline Irrigation: It is important to remove blood clots and dried mucous from the nose as it is healing.  This is done by having you irrigate the nose at least 3 - 4 times daily with a salt water solution.  The salt water solution is made as follows:  1) 2 - 3 heaping teaspoons of canning, pickling or sea salt °  2) 1 teaspoon baking soda, such as Arm [employer] Hammer °  3) 1 quart of warm distilled water °The nose is irrigated using an ear syringe (available at the drug store).  Fill the bulb with the solution, bend over a sink, and insert the syringe into the nose ½ to ¾ of an inch.  Point the tip of the syringe towards the inside corner of the eye on the same side your irrigating.  Squeeze the syringe and gently irrigate the nose.  If you bend forward as you do this, most of the fluid will flow back out of the nose, instead of down your throat.  Make a new solution every 2 - 3 days.  The solution should be ward, near body temperature, when you irrigate. ° °Note that if you are instructed to use Nasal Steroid Sprays at any time after your surgery, irrigate with saline BEFORE using the steroid spray, so you do not wash it all out of the nose. °Another product, Nasal Saline Gel (such as  AYR Nasal Saline Gel) can be applied in each nostril 3 - 4 times daily to moisture the nose and reduce scabbing or crusting. ° °Bleeding:  Bloody drainage from the nose can be expected for several days, and patients are instructed to irrigate their nose frequently with salt water to help remove mucous and blood clots.  The drainage may be dark red or brown, though some fresh blood may be seen intermittently, especially after irrigation.  Do not blow you nose, as bleeding may occur. If you must sneeze, keep your mouth open to allow air to escape through your mouth. °If heavy bleeding occurs: Irrigate the nose with saline to rinse out clots, then spray the nose 3 - 4 times with Afrin Nasal Decongestant Spray.  The spray will constrict the blood vessels to slow bleeding.  Pinch the lower half of your nose shut to apply pressure, and lay down with your head elevated.  Ice packs over the nose may help as well. If bleeding persists despite these measures, you should notify your doctor.  Do not use the Afrin routinely   to control nasal congestion after surgery, as it can result in worsening congestion and may affect healing.  ° °Activity: Return to work varies among patients. Most patients will be out of work at least 5 - 7 days to recover.  Patient may return to work after they are off of narcotic pain medication, and feeling well enough to perform the functions of their job.  Patients must avoid heavy lifting (over 10 pounds) or strenuous physical for 2 weeks after surgery, so your employer may need to assign you to light duty, or keep you out of work longer if light duty is not possible.  NOTE: you should not drive, operate dangerous machinery, do any mentally demanding tasks or make any important legal or financial decisions while on narcotic pain medication and recovering from the general anesthetic.  °  °Call Your Doctor Immediately if You Have Any of the Following: °1. Bleeding that you cannot control with the above  measures °2. Loss of vision, double vision, bulging of the eye or black eyes. °3. Fever over 101 degrees °4. Neck stiffness with severe headache, fever, nausea and change in mental state. °You are always encourage to call anytime with concerns, however, please call with requests for pain medication refills during office hours. ° °Office Endoscopy: During follow-up visits your doctor will remove any packing or splints that may have been placed and evaluate and clean your sinuses endoscopically.  Topical anesthetic will be used to make this as comfortable as possible, though you may want to take your pain medication prior to the visit.  How often this will need to be done varies from patient to patient.  After complete recovery from the surgery, you may need follow-up endoscopy from time to time, particularly if there is concern of recurrent infection or nasal polyps. ° ° °General Anesthesia, Care After °Refer to this sheet in the next few weeks. These instructions provide you with information on caring for yourself after your procedure. Your health care provider may also give you more specific instructions. Your treatment has been planned according to current medical practices, but problems sometimes occur. Call your health care provider if you have any problems or questions after your procedure. °WHAT TO EXPECT AFTER THE PROCEDURE °After the procedure, it is typical to experience: °· Sleepiness. °· Nausea and vomiting. °HOME CARE INSTRUCTIONS °· For the first 24 hours after general anesthesia: °¨ Have a responsible person with you. °¨ Do not drive a car. If you are alone, do not take public transportation. °¨ Do not drink alcohol. °¨ Do not take medicine that has not been prescribed by your health care provider. °¨ Do not sign important papers or make important decisions. °¨ You may resume a normal diet and activities as directed by your health care provider. °· Change bandages (dressings) as directed. °· If you  have questions or problems that seem related to general anesthesia, call the hospital and ask for the anesthetist or anesthesiologist on call. °SEEK MEDICAL CARE IF: °· You have nausea and vomiting that continue the day after anesthesia. °· You develop a rash. °SEEK IMMEDIATE MEDICAL CARE IF:  °· You have difficulty breathing. °· You have chest pain. °· You have any allergic problems. °Document Released: 10/07/2000 Document Revised: 07/06/2013 Document Reviewed: 01/14/2013 °[employer]® Patient Information ©2015 [employer]. This information is not intended to replace advice given to you by your health care provider. Make sure you discuss any questions you have with your health care provider. ° °

## 2014-11-30 ENCOUNTER — Encounter
Admission: RE | Disposition: A | Discharge status: Home / Self Care | Payer: Self-pay | Source: Ambulatory Visit | Attending: Otolaryngology

## 2014-11-30 ENCOUNTER — Ambulatory Visit
Admission: RE | Admit: 2014-11-30 | Discharge: 2014-11-30 | Disposition: A | Discharge status: Home / Self Care | Payer: 59 | Source: Ambulatory Visit | Attending: Otolaryngology | Admitting: Otolaryngology

## 2014-11-30 ENCOUNTER — Ambulatory Visit: Payer: 59 | Admitting: Anesthesiology

## 2014-11-30 DIAGNOSIS — F4310 Post-traumatic stress disorder, unspecified: Secondary | ICD-10-CM | POA: Diagnosis not present

## 2014-11-30 DIAGNOSIS — J329 Chronic sinusitis, unspecified: Secondary | ICD-10-CM | POA: Diagnosis present

## 2014-11-30 DIAGNOSIS — J343 Hypertrophy of nasal turbinates: Secondary | ICD-10-CM | POA: Insufficient documentation

## 2014-11-30 DIAGNOSIS — Z79899 Other long term (current) drug therapy: Secondary | ICD-10-CM | POA: Insufficient documentation

## 2014-11-30 DIAGNOSIS — Z881 Allergy status to other antibiotic agents status: Secondary | ICD-10-CM | POA: Insufficient documentation

## 2014-11-30 DIAGNOSIS — J342 Deviated nasal septum: Secondary | ICD-10-CM | POA: Diagnosis not present

## 2014-11-30 HISTORY — PX: SEPTOPLASTY WITH ETHMOIDECTOMY, AND MAXILLARY ANTROSTOMY: SHX6090

## 2014-11-30 HISTORY — PX: IMAGE GUIDED SINUS SURGERY: SHX6570

## 2014-11-30 HISTORY — PX: SPHENOIDECTOMY: SHX2421

## 2014-11-30 HISTORY — PX: ENDOSCOPIC TURBINATE REDUCTION: SHX6489

## 2014-11-30 HISTORY — PX: FRONTAL SINUS EXPLORATION: SHX6591

## 2014-11-30 SURGERY — SINUS SURGERY, WITH IMAGING GUIDANCE
Anesthesia: General

## 2014-11-30 SURGERY — SINUS SURGERY, WITH IMAGING GUIDANCE
Anesthesia: General | Wound class: Clean Contaminated

## 2014-11-30 MED ORDER — MIDAZOLAM HCL 2 MG/2ML IJ SOLN
2.0000 mg | INTRAMUSCULAR | Status: DC | PRN
  Administered 2014-11-30: 2 mg via INTRAVENOUS

## 2014-11-30 MED ORDER — BACITRACIN 500 UNIT/GM EX OINT
TOPICAL_OINTMENT | CUTANEOUS | Status: DC | PRN
  Administered 2014-11-30: 2 via TOPICAL

## 2014-11-30 MED ORDER — LIDOCAINE-EPINEPHRINE 1 %-1:100000 IJ SOLN
INTRAMUSCULAR | Status: DC | PRN
  Administered 2014-11-30: 7 mL

## 2014-11-30 MED ORDER — OXYCODONE-ACETAMINOPHEN 5-325 MG PO TABS: 1.0000 | ORAL_TABLET | ORAL | Status: DC | PRN

## 2014-11-30 MED ORDER — ACETAMINOPHEN 10 MG/ML IV SOLN
INTRAVENOUS | Status: DC | PRN
  Administered 2014-11-30: 1000 mg via INTRAVENOUS

## 2014-11-30 MED ORDER — ACETAMINOPHEN 10 MG/ML IV SOLN
1000.0000 mg | Freq: Once | INTRAVENOUS | Status: AC
  Administered 2014-11-30: 1000 mg via INTRAVENOUS

## 2014-11-30 MED ORDER — OXYCODONE HCL 5 MG/5ML PO SOLN: 5.0000 mg | Freq: Once | ORAL | Status: AC | PRN

## 2014-11-30 MED ORDER — LACTATED RINGERS IV SOLN
INTRAVENOUS | Status: DC
  Administered 2014-11-30 (×2): via INTRAVENOUS

## 2014-11-30 MED ORDER — PROPOFOL 10 MG/ML IV BOLUS
INTRAVENOUS | Status: DC | PRN
  Administered 2014-11-30: 150 mg via INTRAVENOUS

## 2014-11-30 MED ORDER — FENTANYL CITRATE (PF) 100 MCG/2ML IJ SOLN
25.0000 ug | INTRAMUSCULAR | Status: AC | PRN
  Administered 2014-11-30: 25 ug via INTRAVENOUS
  Administered 2014-11-30 (×2): 50 ug via INTRAVENOUS
  Administered 2014-11-30: 25 ug via INTRAVENOUS

## 2014-11-30 MED ORDER — OXYMETAZOLINE HCL 0.05 % NA SOLN
NASAL | Status: DC | PRN
  Administered 2014-11-30: 1 via NASAL

## 2014-11-30 MED ORDER — OXYCODONE HCL 5 MG PO TABS
5.0000 mg | ORAL_TABLET | Freq: Once | ORAL | Status: AC | PRN
  Administered 2014-11-30: 5 mg via ORAL

## 2014-11-30 MED ORDER — DEXAMETHASONE SODIUM PHOSPHATE 4 MG/ML IJ SOLN
INTRAMUSCULAR | Status: DC | PRN
  Administered 2014-11-30: 8 mg via INTRAVENOUS

## 2014-11-30 MED ORDER — ROCURONIUM BROMIDE 100 MG/10ML IV SOLN
INTRAVENOUS | Status: DC | PRN
  Administered 2014-11-30 (×3): 10 mg via INTRAVENOUS
  Administered 2014-11-30: 20 mg via INTRAVENOUS

## 2014-11-30 MED ORDER — PROMETHAZINE HCL 12.5 MG PO TABS: 12.5000 mg | ORAL_TABLET | Freq: Four times a day (QID) | ORAL | Status: DC | PRN

## 2014-11-30 MED ORDER — LIDOCAINE HCL (CARDIAC) 20 MG/ML IV SOLN
INTRAVENOUS | Status: DC | PRN
  Administered 2014-11-30: 50 mg via INTRAVENOUS

## 2014-11-30 MED ORDER — ONDANSETRON HCL 4 MG/2ML IJ SOLN: 4.0000 mg | Freq: Once | INTRAMUSCULAR | Status: DC | PRN

## 2014-11-30 MED ORDER — ONDANSETRON HCL 4 MG/2ML IJ SOLN
INTRAMUSCULAR | Status: DC | PRN
  Administered 2014-11-30: 4 mg via INTRAVENOUS

## 2014-11-30 MED ORDER — SUCCINYLCHOLINE CHLORIDE 20 MG/ML IJ SOLN
INTRAMUSCULAR | Status: DC | PRN
  Administered 2014-11-30: 100 mg via INTRAVENOUS

## 2014-11-30 MED ORDER — GLYCOPYRROLATE 0.2 MG/ML IJ SOLN
INTRAMUSCULAR | Status: DC | PRN
  Administered 2014-11-30: .2 mg via INTRAVENOUS

## 2014-11-30 MED ORDER — SULFAMETHOXAZOLE-TRIMETHOPRIM 800-160 MG PO TABS: 1.0000 | ORAL_TABLET | Freq: Two times a day (BID) | ORAL | Status: DC

## 2014-11-30 MED ORDER — SODIUM CHLORIDE 0.9 % IR SOLN
Status: DC | PRN
  Administered 2014-11-30: 100 mL

## 2014-11-30 MED ORDER — SCOPOLAMINE 1 MG/3DAYS TD PT72
1.0000 | MEDICATED_PATCH | Freq: Once | TRANSDERMAL | Status: DC
  Administered 2014-11-30: 1.5 mg via TRANSDERMAL

## 2014-11-30 MED ORDER — FENTANYL CITRATE (PF) 100 MCG/2ML IJ SOLN
INTRAMUSCULAR | Status: DC | PRN
  Administered 2014-11-30: 100 ug via INTRAVENOUS

## 2014-11-30 SURGICAL SUPPLY — 45 items
ARISTA DELIVERY TIP ×4 IMPLANT
ARISTA HEMOSTATIC ×4 IMPLANT
BALLOON SINUPLASTY SYSTEM (BALLOONS) IMPLANT
BATTERY INSTRU NAVIGATION (MISCELLANEOUS) ×16 IMPLANT
BLADE IRRIGATOR 40D CVD (IRRIGATION / IRRIGATOR) IMPLANT
CANISTER SUCT 1200ML W/VALVE (MISCELLANEOUS) ×4 IMPLANT
COAG SUCT 10F 3.5MM HAND CTRL (MISCELLANEOUS) ×4 IMPLANT
DEVICE INFLATION 20/61 (MISCELLANEOUS) ×4 IMPLANT
DRAPE HEAD BAR (DRAPES) ×4 IMPLANT
DRESSING NASL FOAM PST OP SINU (MISCELLANEOUS) IMPLANT
DRSG NASAL 4CM NASOPORE (MISCELLANEOUS) IMPLANT
DRSG NASAL FOAM POST OP SINU (MISCELLANEOUS)
GLOVE BIO SURGEON STRL SZ7.5 (GLOVE) ×12 IMPLANT
IMPL PROPEL SINUS 23MML (Prosthesis and Implant ENT) ×6 IMPLANT
IMPLANT PROPEL SINUS 23MML (Prosthesis and Implant ENT) ×8 IMPLANT
IRRIGATOR 4MM STR (IRRIGATION / IRRIGATOR) ×4 IMPLANT
IV NS 500ML (IV SOLUTION) ×1
IV NS 500ML BAXH (IV SOLUTION) ×3 IMPLANT
NAVIGATION MASK REG  ST (MISCELLANEOUS) ×4 IMPLANT
NEEDLE HYPO 25GX1X1/2 BEV (NEEDLE) ×4 IMPLANT
NS IRRIG 500ML POUR BTL (IV SOLUTION) ×4 IMPLANT
PACK DRAPE NASAL/ENT (PACKS) ×4 IMPLANT
PACKING NASAL EPIS 4X2.4 XEROG (MISCELLANEOUS) IMPLANT
PAD GROUND ADULT SPLIT (MISCELLANEOUS) ×4 IMPLANT
PATTIES SURGICAL .5 X3 (DISPOSABLE) ×4 IMPLANT
SET HANDPIECE IRR DIEGO (MISCELLANEOUS) ×4 IMPLANT
SINUPLASTY SPHENOID GUIDE (MISCELLANEOUS) ×4 IMPLANT
SOL ANTI-FOG 6CC FOG-OUT (MISCELLANEOUS) ×3 IMPLANT
SOL FOG-OUT ANTI-FOG 6CC (MISCELLANEOUS) ×1
SPLINT NASAL .50MM LRG (MISCELLANEOUS) ×4 IMPLANT
SPLINT NASAL SEPTAL PRE-CUT (MISCELLANEOUS) IMPLANT
STRAP BODY AND KNEE 60X3 (MISCELLANEOUS) ×4 IMPLANT
SUT CHROMIC 4 0 RB 1X27 (SUTURE) ×4 IMPLANT
SUT ETHILON 3 0 FSLX (SUTURE) ×4 IMPLANT
SUT ETHILON 3-0 FS-10 30 BLK (SUTURE) ×4
SUT ETHILON 4-0 (SUTURE)
SUT ETHILON 4-0 FS2 18XMFL BLK (SUTURE)
SUT PLAIN GUT 4-0 (SUTURE) IMPLANT
SUTURE EHLN 3-0 FS-10 30 BLK (SUTURE) ×3 IMPLANT
SUTURE ETHLN 4-0 FS2 18XMF BLK (SUTURE) IMPLANT
SYR EAR/ULCER 2OZ (SYRINGE) ×4 IMPLANT
SYRINGE 10CC LL (SYRINGE) ×4 IMPLANT
SYSTEM BALLN SINUPLASTY 6X16 (BALLOONS) ×4 IMPLANT
TOWEL OR 17X26 4PK STRL BLUE (TOWEL DISPOSABLE) ×4 IMPLANT
WATER STERILE IRR 500ML POUR (IV SOLUTION) IMPLANT

## 2014-11-30 NOTE — Anesthesia Postprocedure Evaluation (Signed)
  Anesthesia Post-op Note  Patient: Ebony Lewis  Procedure(s) Performed: Procedure(s): IMAGE GUIDED SINUS SURGERY (N/A) SEPTOPLASTY WITH ETHMOIDECTOMY, AND MAXILLARY ANTROSTOMY (Bilateral) ENDOSCOPIC TURBINATE REDUCTION (Bilateral) SPHENOIDECTOMY (Bilateral) FRONTAL SINUS EXPLORATION (Left)  Anesthesia type:General ETT  Patient location: PACU  Post pain: Pain level controlled  Post assessment: Post-op Vital signs reviewed, Patient's Cardiovascular Status Stable, Respiratory Function Stable, Patent Airway and No signs of Nausea or vomiting  Post vital signs: Reviewed and stable  Last Vitals:  Filed Vitals:   11/30/14 1200  BP: 127/94  Pulse: 94  Temp: 36.6 C  Resp: 16    Level of consciousness: awake, alert  and patient cooperative  Complications: No apparent anesthesia complications

## 2014-11-30 NOTE — Anesthesia Procedure Notes (Signed)
Procedure Name: Intubation Date/Time: 11/30/2014 9:17 AM Performed by: Londell Moh Pre-anesthesia Checklist: Patient identified, Emergency Drugs available, Suction available, Patient being monitored and Timeout performed Patient Re-evaluated:Patient Re-evaluated prior to inductionOxygen Delivery Method: Circle system utilized Preoxygenation: Pre-oxygenation with 100% oxygen Intubation Type: IV induction Ventilation: Mask ventilation without difficulty Laryngoscope Size: Mac and 3 Grade View: Grade I Tube type: Oral Rae Tube size: 7.0 mm Number of attempts: 1 Placement Confirmation: ETT inserted through vocal cords under direct vision,  positive ETCO2 and breath sounds checked- equal and bilateral Tube secured with: Tape Dental Injury: Teeth and Oropharynx as per pre-operative assessment

## 2014-11-30 NOTE — H&P (Signed)
..  History and Physical paper copy reviewed and updated date of procedure and will be scanned into system.  

## 2014-11-30 NOTE — Op Note (Signed)
..11/30/2014  11:54 AM    Ebony Lewis Ebony Lewis  [id number]   Pre-Op Dx:  Chronic Rhinosinusitis refractory to medical treatment  Post-op Dx: Same  Proc:   1)  Image Guided Sinus Surgery,  2)  Bilateral Maxillary Antrostomy  3)  Left Frontal Sinusotomy  4)  Bilateral total ethmoidectomy  5)  Bilateral Sphenoidotomy  6)  Bilateral Propel Stent Placement in Ethmoid Sinus Cavity  7)  Septoplasty  8)  Bilateral Inferior Turbinate Reduction Via resection of tissue.  Surg:  Ebony Lewis  Anes:  General  EBL:  75ccs  Comp:  None  Findings: Significant right sided bone and cartilage deviation of septum, bilateral bone and soft tissue turbinate hypertrophy, osteitic bone throughout all sinuses, polypoid tissue in ethmoid sinuses.  All mentioned sinuses opened and widely patent  Procedure: After the patient was identified in holding and the benefits of the procedure were reviewed as well as the consent and risks.  The patient was taken to the operating room and with the patient in a comfortable supine position,  general orotracheal anesthesia was induced without difficulty.  A proper time-out was performed.  The Stryker image guidance system was set up and calibrated in the normal fashion with an acceptable error of 0.51mm.       The patient next received preoperative Afrin spray for topical decongestion and vasoconstriction and 1% Xylocaine with 1:100,000 epinephrine, 7cc's, was infiltrated into the inferior turbinates, septum, and anterior middle turbinates bilaterally.  Several minutes were allowed for this to take effect.  Cottoniod pledgets soaked in Afrin were placed into both nasal cavities and left while the patient was prepped and draped in the standard fashion.  The materials were removed from the nose and observed to be intact and correct in number.  The nose was next inspected with a zero degree endoscope and the middle turbinates were medialized and afrin soaked pledgets were  placed lateral to the turbinates for approximately one minute.  At this time, attention was directed to the patient's septoplasty.  A Killian incision was made on the left anterior septum with a 15 blade scalpel.  A mucoperichondrial flap was elevated on the patient's left side past the bone/cartilagenous junction.  An area of cartilage was noted to be deviated just anterior to the junction and thus an intracartilaginous incision with a Soil scientist was made.  A contralateral mucoperichondrial flap was elevated posteriorly to the bone/cartilagenous junction.  The deviated portion of cartilage was removed with straight-biting forceps.  An area of bone deviation which was impacting on the right middle turbinate was next removed with a Grimwold tru-cutting forceps.  This resulted in resolution of the deviation of the septum.  Next, attention was directed to the patient's inferior turbinates.  These were in fractured with a Soil scientist under endoscopic visualization bilaterally.  A tonsil clamp was attached to the anterior/inferior third of each inferior turbinate for 1 minute.  Under endoscopic visualization, a Grimwold forceps was used to removed the anterior/inferior third of each inferior turbinate.  The cut edge of the turbinate was cauterized with Bovie suction cautery.  The turbinates were next out fractured resulting in a widely patent airway.  At this time, attention was directed to the patient's left front sinus.  The uncinate process was in fractured with a maxillary ostia seeker.  The Acclarent balloon sinuplasty device was next placed behind the uncinate process and the guide wire was attempted to be threaded into the frontal sinus.  This was repeatedly entering  a false passage and therefore a decision was made to proceed with the maxillary antrostomy and total ethmoidectomy.  At this time attention was directed to the patient's maxillary sinuses.  On the left, a ball tipped probe was placed  through the natural ostia and this was used to create a larger opening.  Using a Diego microdebrider, the maxillary antrostomy was enlarged for a widely patent maxillary antrostomy.  This was repeated in a similar fashion on the patient's right side.  Hemostasis was performed with topical Afrin soaked pledgets.  Visualization with a zero degree endoscope was used to examine the bilateral maxillary antrostomies which were noted to be widely patent and in continuity with the natural os bilaterally.  Next attention was directed to the patient's ethmoid sinuses.  The left ethmoid bulla was entered with a straight image guidance suction.  The ethmoid cells were opened from a medial and inferior position superior and laterally until the vertical lamella was encountered.  This was opened and the posterior ethmoid was opened in a similar fashion.  All fragments of bone and mucosa were removed with straight biting forceps.  The skull base was identified and trauma was avoided.  Image guidance was used throughout to ensure all diseased air cells were opened.  This was repeated on the patient's right side in a similar fashion with all ethmoid air cells opened bilaterally.  An inferior ledge for the propel stents to sit on was left bilaterally.   At this time attention was returned to the left frontal sinus.  Using curved suction with image guidance, once the ethmoid bulla was opened the frontal sinus outflow tract was recognized and dilated.  This was dilated with combination of acclarant balloon device with proper transillumination.  The sinus tract was then dilated 3X for a patent frontal sinus.  At this time a currette and 45 degree upbiting forceps were used to remove the fractured ager nasi cells for a widely patient frontal sinus outflow tract. This was evaluated with 0 degree endoscope and fragments of residual ager nasi cells were removed with 45 degree up-biting forceps.  Attention at this time was directed to  the patient's sphenoid sinuses.  On the patient's left side, the middle turbinate was lateralized and the superior turbinate was identified.  The sphenoid os was visualized and the opening was sequentially dilated with an acclarent balloon device and then enlarged with 45 degree biting forceps.  This was repeated on the patient's opposite side again with creation of a large sphenoid ostia bilaterally.  At this time with all planned sinuses opened, the patient's nasal cavity was examined and copiously irrigated with sterile saline.  Meticulous hemostasis was continued and all sinuses were examined and noted to be widely patent.    Next propel stents Standard size were placed in the anterior and posterior ethmoid cells under endoscopic visualization.  Proper positioning of the stent along the anterior border of the middle turbinate was ensured.  Proper medialization of the bilateral middle turbinates was also ensured.    Arista was next dusted into all sinus cavities to aid in continued hemostasis.  At this time, the previous Killian incision from the septoplasty was closed in an interrupted fashion with 4.0 chromic suture.  Standard septal splints were placed bilaterally after placement of single drain hole on patient's left mucoperichondrial flap.  These splints were held in place with a 3.0 Nylon suture.  Care of the patient at this time was transferred to anesthesia and was extubated  and taken to PACU in good condition.   Dispo:   PACU to home  Plan: Ice, elevation, narcotic analgesia and prophylactic antibiotics   We will reevaluate the patient in the office in 7 days.  Return to work in 7-10 days, strenuous activities in two weeks.  Begin nasal saline irrigation 3x a day on post-op day #1.   Ebony Lewis 11/30/2014 11:54 AM

## 2014-11-30 NOTE — Transfer of Care (Signed)
Immediate Anesthesia Transfer of Care Note  Patient: Ebony Lewis  Procedure(s) Performed: Procedure(s): IMAGE GUIDED SINUS SURGERY (N/A) SEPTOPLASTY WITH ETHMOIDECTOMY, AND MAXILLARY ANTROSTOMY (Bilateral) ENDOSCOPIC TURBINATE REDUCTION (Bilateral) SPHENOIDECTOMY (Bilateral) FRONTAL SINUS EXPLORATION (Left)  Patient Location: PACU  Anesthesia Type: General ETT  Level of Consciousness: awake, alert  and patient cooperative  Airway and Oxygen Therapy: Patient Spontanous Breathing and Patient connected to supplemental oxygen  Post-op Assessment: Post-op Vital signs reviewed, Patient's Cardiovascular Status Stable, Respiratory Function Stable, Patent Airway and No signs of Nausea or vomiting  Post-op Vital Signs: Reviewed and stable  Complications: No apparent anesthesia complications

## 2014-12-01 ENCOUNTER — Encounter: Payer: Self-pay | Admitting: Otolaryngology

## 2014-12-01 ENCOUNTER — Other Ambulatory Visit: Payer: Self-pay | Admitting: Cardiovascular Disease

## 2014-12-02 LAB — SURGICAL PATHOLOGY

## 2015-01-01 ENCOUNTER — Ambulatory Visit (INDEPENDENT_AMBULATORY_CARE_PROVIDER_SITE_OTHER): Payer: 59 | Admitting: Family Medicine

## 2015-01-01 ENCOUNTER — Ambulatory Visit (INDEPENDENT_AMBULATORY_CARE_PROVIDER_SITE_OTHER): Payer: 59

## 2015-01-01 VITALS — BP 116/80 | HR 90 | Temp 98.5°F | Resp 20 | Ht 69.0 in | Wt 200.0 lb

## 2015-01-01 DIAGNOSIS — M79671 Pain in right foot: Secondary | ICD-10-CM | POA: Diagnosis not present

## 2015-01-01 DIAGNOSIS — S92301A Fracture of unspecified metatarsal bone(s), right foot, initial encounter for closed fracture: Secondary | ICD-10-CM | POA: Diagnosis not present

## 2015-01-01 DIAGNOSIS — R110 Nausea: Secondary | ICD-10-CM

## 2015-01-01 MED ORDER — HYDROCODONE-ACETAMINOPHEN 5-325 MG PO TABS: 1.0000 | ORAL_TABLET | Freq: Four times a day (QID) | ORAL | Status: DC | PRN

## 2015-01-01 MED ORDER — ONDANSETRON 4 MG PO TBDP
8.0000 mg | ORAL_TABLET | Freq: Once | ORAL | Status: AC
  Administered 2015-01-01: 8 mg via ORAL

## 2015-01-01 NOTE — Progress Notes (Signed)
Subjective:    Patient ID: Stephannie Peters, female    DOB: May 05, 1986, 29 y.o.   MRN: [id number]  01/01/2015  Foot Injury   HPI This 29 y.o. female presents for evaluation of R foot pain, swelling, and bruising after fall last night.  Fell down steps last night while carrying food into the house.  +acute onset of R foot swelling and bruising.  Unable to bear wear on foot. Denies numbness or tingling in foot.  Denies cyanosis of foot.  No calf pain or ankle pain.  S/p BTL; LMP 12-06-14.  Has taken Ibuprofen 800mg  and Percocet 5/325 which was left over from recent sinus surgery in 11/2014.   Review of Systems  Constitutional: Negative for fever, chills, diaphoresis and fatigue.  Musculoskeletal: Positive for joint swelling, arthralgias and gait problem.  Skin: Negative for color change, pallor and wound.  Neurological: Negative for weakness and numbness.    Past Medical History  Diagnosis Date  . Asthma   . Allergy   . Blood transfusion without reported diagnosis   . Depression   . Heart murmur   . Anxiety     Pt states PTSD s/p post delivery hemorrhage.   Past Surgical History  Procedure Laterality Date  . Dilation and curettage of uterus      EMERGENCY  . Tubal ligation    . Image guided sinus surgery N/A 11/30/2014    Procedure: IMAGE GUIDED SINUS SURGERY;  Surgeon: Carloyn Manner, MD;  Location: [facility];  Service: ENT;  Laterality: N/A;  . Septoplasty with ethmoidectomy, and maxillary antrostomy Bilateral 11/30/2014    Procedure: SEPTOPLASTY WITH ETHMOIDECTOMY, AND MAXILLARY ANTROSTOMY;  Surgeon: Carloyn Manner, MD;  Location: [facility];  Service: ENT;  Laterality: Bilateral;  . Endoscopic turbinate reduction Bilateral 11/30/2014    Procedure: ENDOSCOPIC TURBINATE REDUCTION;  Surgeon: Carloyn Manner, MD;  Location: [facility];  Service: ENT;  Laterality: Bilateral;  . Sphenoidectomy Bilateral 11/30/2014    Procedure: Coralee Pesa;  Surgeon:  Carloyn Manner, MD;  Location: [facility];  Service: ENT;  Laterality: Bilateral;  . Frontal sinus exploration Left 11/30/2014    Procedure: FRONTAL SINUS EXPLORATION;  Surgeon: Carloyn Manner, MD;  Location: [facility];  Service: ENT;  Laterality: Left;  . Nasal sinus surgery  11/2014   Allergies  Allergen Reactions  . Amoxicillin Diarrhea and Rash     Severe diarrhea and rash. Augmentin also.    History   Social History  . Marital Status: Single    Spouse Name: N/A  . Number of Children: N/A  . Years of Education: N/A   Occupational History  . Not on file.   Social History Main Topics  . Smoking status: Former Smoker    Quit date: 11/13/2011  . Smokeless tobacco: Not on file     Comment: Started age 31. Smokes 5-7 cigs weekly   . Alcohol Use: 0.6 oz/week    1 Glasses of wine, 0 Standard drinks or equivalent per week     Comment: Socially   . Drug Use: No  . Sexual Activity:    Partners: Male     Comment: Fiance   Other Topics Concern  . 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 She appears well-developed and well-nourished. No distress.  HENT:  Head: Normocephalic and atraumatic.  Eyes: Conjunctivae are normal. Pupils are equal, round, and reactive to light.  Neck: Normal range of motion. Neck supple.  Cardiovascular: Normal rate, regular rhythm and normal heart sounds.  Exam reveals no gallop and no friction rub.   No murmur heard. Pulses:      Dorsalis pedis pulses are 2+ on the right side, and 2+ on the left side.  Capillary refill < 3 seconds R foot; foot  is warm.  Pulmonary/Chest: Effort normal and breath sounds normal. She has no wheezes. She has no rales.  Musculoskeletal:       Right ankle: Normal. She exhibits normal range of motion, no swelling, no ecchymosis, no deformity, no laceration and normal pulse. No tenderness. No lateral malleolus, no medial malleolus, no AITFL and no proximal fibula tenderness found. Achilles tendon normal.       Right lower leg: Normal. She exhibits no tenderness, no bony tenderness, no swelling, no edema, no deformity and no laceration.       Right foot: There is decreased range of motion, tenderness, bony tenderness and swelling. There is normal capillary refill, no crepitus, no deformity and no laceration.  R FOOT: +diffuse swelling or R foot; +ecchymoses along 4th and 5th metatarsals.  +TTP 4th and 5th metatarsal, longitudinal arch. No tenderness of digits or heel/calcaneus.  Neurological: She is alert and oriented to person, place, and time. No sensory deficit.  Skin: She is not diaphoretic.  Psychiatric: She has a normal mood and affect. Her behavior is normal.  Nursing note and vitals reviewed.  [facility] reading (PRIMARY) by  Dr. Tamala Julian.  R FOOT FILMS: proximal metatarsal fractures 2nd, 3rd, 4th. +cuneiform fracture external/third?   POSTERIOR SPLINT PLACED TO RLE BY AMY LITRELL. CRUTCHES FITTED AND EDUCATION PROVIDED. ZOFRAN 8MG  ODT ADMINISTERED IN OFFICE.    Assessment & Plan:   1. Right foot pain   2. Nausea without vomiting   3. Metatarsal fracture, right, closed, initial encounter    1.  Metatarsal fracture R 2nd, 3rd, 4th:  New.  Posterior splint placed with crutches; rx for hydrocodone provided.  Continue Ibuprofen 800mg  tid.  Refer to ortho for management. 2.  Nausea: New. Secondary to taking narcotic on empty stomach prior to arrival; s/p Zofran in office.   Meds ordered this encounter  Medications  . ondansetron (ZOFRAN-ODT) disintegrating tablet 8 mg    Sig:   . HYDROcodone-acetaminophen  (NORCO/VICODIN) 5-325 MG per tablet    Sig: Take 1 tablet by mouth every 6 (six) hours as needed for moderate pain.    Dispense:  30 tablet    Refill:  0    No Follow-up on file.     Raeden Schippers Elayne Guerin, M.D. [facility] [address] [place], [place]  [address] [phone] phone [phone] fax

## 2015-01-01 NOTE — Patient Instructions (Signed)
Metatarsal Fracture, Undisplaced  A metatarsal fracture is a break in the bone(s) of the foot. These are the bones of the foot that connect your toes to the bones of the ankle.  DIAGNOSIS   The diagnoses of these fractures are usually made with X-rays. If there are problems in the forefoot and x-rays are normal a later bone scan will usually make the diagnosis.   TREATMENT AND HOME CARE INSTRUCTIONS  · Treatment may or may not include a cast or walking shoe. When casts are needed the use is usually for short periods of time so as not to slow down healing with muscle wasting (atrophy).  · Activities should be stopped until further advised by your caregiver.  · Wear shoes with adequate shock absorbing capabilities and stiff soles.  · Alternative exercise may be undertaken while waiting for healing. These may include bicycling and swimming, or as your caregiver suggests.  · It is important to keep all follow-up visits or specialty referrals. The failure to keep these appointments could result in improper bone healing and chronic pain or disability.  · Warning: Do not drive a car or operate a motor vehicle until your caregiver specifically tells you it is safe to do so.  IF YOU DO NOT HAVE A CAST OR SPLINT:  · You may walk on your injured foot as tolerated or advised.  · Do not put any weight on your injured foot for as long as directed by your caregiver. Slowly increase the amount of time you walk on the foot as the pain allows or as advised.  · Use crutches until you can bear weight without pain. A gradual increase in weight bearing may help.  · Apply ice to the injury for 15-20 minutes each hour while awake for the first 2 days. Put the ice in a plastic bag and place a towel between the bag of ice and your skin.  · Only take over-the-counter or prescription medicines for pain, discomfort, or fever as directed by your caregiver.  SEEK IMMEDIATE MEDICAL CARE IF:   · Your cast gets damaged or breaks.  · You have  continued severe pain or more swelling than you did before the cast was put on, or the pain is not controlled with medications.  · Your skin or nails below the injury turn blue or grey, or feel cold or numb.  · There is a bad smell, or new stains or pus-like (purulent) drainage coming from the cast.  MAKE SURE YOU:   · Understand these instructions.  · Will watch your condition.  · Will get help right away if you are not doing well or get worse.  Document Released: 03/23/2002 Document Revised: 09/23/2011 Document Reviewed: 02/12/2008  ExitCare® Patient Information ©2015 [employer]. This information is not intended to replace advice given to you by your health care provider. Make sure you discuss any questions you have with your health care provider.

## 2015-01-02 ENCOUNTER — Telehealth: Payer: Self-pay | Admitting: Family Medicine

## 2015-01-02 NOTE — Telephone Encounter (Signed)
[employer] faxed FMLA forms on 01/02/2015. Called patient to get more information. Patient was seen on 01/01/2015 and has a broken foot. She is scheduled to see an orthopaedic specialist one day this week. Patient states this FMLA is for intermittent leave. Please complete within 5-7 business days and return to [place]. Blank copy of forms are scanned under release ID # [id number].  Forms placed in Dr. Thompson Caul box on 01/02/2015.

## 2015-01-05 NOTE — Telephone Encounter (Signed)
FMLA paperwork completed and placed on disability desk.

## 2015-01-06 NOTE — Telephone Encounter (Signed)
Paperwork faxed on 01/06/2015 at 9am. Patient called at 1:18pm and states that [employer] did receive the paperwork however some changes need to be made. Patient needs the FMLA on file if she has to call out of work due to her broken foot. She needs intermittent leave because she may have to call out of work at least one day a week. She works 10 hour days, 4 days a week. Waiting on [employer] to refax forms and then they will be placed in Dr. Thompson Caul box.

## 2015-01-09 ENCOUNTER — Encounter: Payer: Self-pay | Admitting: Physician Assistant

## 2015-01-09 DIAGNOSIS — M26629 Arthralgia of temporomandibular joint, unspecified side: Secondary | ICD-10-CM | POA: Insufficient documentation

## 2015-01-11 ENCOUNTER — Telehealth: Payer: Self-pay | Admitting: Internal Medicine

## 2015-01-11 ENCOUNTER — Telehealth: Payer: Self-pay

## 2015-01-11 NOTE — Telephone Encounter (Signed)
error 

## 2015-01-11 NOTE — Telephone Encounter (Signed)
Jasmine spoke with patient on 6/24 and advised patient that changes would be made and faxed. This has been done. I attempted contacting patient 6/28 and 6/29 and received an error message both times. I have not received a voicemail from patient, only verbal notifications from the phone operators letting me know that she has called, but refused the voicemail option.

## 2015-01-11 NOTE — Telephone Encounter (Signed)
Patient is calling to get in touch with disability. She states that she's left messages since last week and hasn't heard anything. She is paranoid that her claim will get denied. I offered to transferred to voicemail. She refused asked for a Freight forwarder. It was advised that she wait until someone gets to the disability desk. Please call! [phone]

## 2015-01-19 ENCOUNTER — Other Ambulatory Visit: Payer: Self-pay | Admitting: Nurse Practitioner

## 2015-01-19 DIAGNOSIS — R062 Wheezing: Secondary | ICD-10-CM

## 2015-01-19 MED ORDER — ALBUTEROL SULFATE HFA 108 (90 BASE) MCG/ACT IN AERS: 2.0000 | INHALATION_SPRAY | RESPIRATORY_TRACT | Status: DC | PRN

## 2015-03-23 ENCOUNTER — Encounter: Payer: Self-pay | Admitting: Cardiovascular Disease

## 2015-03-23 ENCOUNTER — Ambulatory Visit (INDEPENDENT_AMBULATORY_CARE_PROVIDER_SITE_OTHER): Payer: 59 | Admitting: Cardiovascular Disease

## 2015-03-23 ENCOUNTER — Telehealth: Payer: Self-pay | Admitting: Nurse Practitioner

## 2015-03-23 VITALS — BP 116/80 | HR 66

## 2015-03-23 DIAGNOSIS — R55 Syncope and collapse: Secondary | ICD-10-CM | POA: Insufficient documentation

## 2015-03-23 DIAGNOSIS — R002 Palpitations: Secondary | ICD-10-CM | POA: Diagnosis not present

## 2015-03-23 LAB — BASIC METABOLIC PANEL
BUN: 8 mg/dL (ref 6–23)
CO2: 28 mEq/L (ref 19–32)
Calcium: 9.6 mg/dL (ref 8.4–10.5)
Chloride: 103 mEq/L (ref 96–112)
Creatinine, Ser: 0.77 mg/dL (ref 0.40–1.20)
GFR: 95.22 mL/min (ref >60.00)
Glucose, Bld: 96 mg/dL (ref 70–99)
Potassium: 3.6 mEq/L (ref 3.5–5.1)
Sodium: 138 mEq/L (ref 135–145)

## 2015-03-23 LAB — TSH: TSH: 1.04 u[IU]/mL (ref 0.35–4.50)

## 2015-03-23 NOTE — Telephone Encounter (Signed)
error 

## 2015-03-23 NOTE — Progress Notes (Signed)
Ebony Lewis was seen briefly today for palpitations.  she felt that her HR stopped for 5 seconds. She did not have any syncope but did feel like she was about to pass out. She came back to our examination room. She was quite distraught. Very anxious  ECG shows :  NSR at 70 . Arm lead reversal   About 10 minutes later , she felt much better.   Will check labs today    BMP , TSH . Will place a 48 hour Holter.    Juletta Berhe, [name], MD  03/23/2015 1:53 PM    [facility],  [place], [place]  [address] Pager [phone] Phone: [phone]; Fax: [phone]   [facility]  [address] [place], [place]  [address] [phone]   Fax [phone]

## 2015-03-23 NOTE — Patient Instructions (Signed)
Medication Instructions:  Your physician recommends that you continue on your current medications as directed. Please refer to the Current Medication list given to you today.   Labwork: TODAY - TSH, basic metabolic panel   Testing/Procedures: Your physician has recommended that you wear a holter monitor. Holter monitors are medical devices that record the heart's electrical activity. Doctors most often use these monitors to diagnose arrhythmias. Arrhythmias are problems with the speed or rhythm of the heartbeat. The monitor is a small, portable device. You can wear one while you do your normal daily activities. This is usually used to diagnose what is causing palpitations/syncope (passing out).    Follow-Up: Your physician recommends that you schedule a follow-up appointment in: as needed with Dr. Acie Fredrickson

## 2015-03-27 ENCOUNTER — Other Ambulatory Visit: Payer: Self-pay | Admitting: Cardiovascular Disease

## 2015-03-27 ENCOUNTER — Telehealth: Payer: Self-pay | Admitting: *Deleted

## 2015-03-27 MED ORDER — FLUCONAZOLE 150 MG PO TABS: 150.0000 mg | ORAL_TABLET | Freq: Every day | ORAL | Status: DC

## 2015-03-27 NOTE — Progress Notes (Signed)
Pt complains of a yeast infection Will send in script for Diflucan 150 PO x 1 with refills.    Nahser, [name], MD  03/27/2015 11:20 AM    [facility],  [place], [place]  [address] Pager [phone] Phone: [phone]; Fax: [phone]   [facility]  [address] [place], [place]  [address] [phone]   Fax [phone]

## 2015-03-27 NOTE — Telephone Encounter (Signed)
[facility] called as they did not receive the rx for the fluconazole as the patient was there to pick this up. Per order note from today Dr Acie Fredrickson has approved this, but it was on print so the pharmacy did not receive this. I gave verbal approval for medication.

## 2015-03-29 ENCOUNTER — Encounter: Payer: Self-pay | Admitting: Cardiovascular Disease

## 2015-05-03 ENCOUNTER — Other Ambulatory Visit: Payer: Self-pay | Admitting: Cardiovascular Disease

## 2015-05-03 DIAGNOSIS — B379 Candidiasis, unspecified: Secondary | ICD-10-CM

## 2015-05-03 DIAGNOSIS — J019 Acute sinusitis, unspecified: Secondary | ICD-10-CM

## 2015-05-03 MED ORDER — FLUCONAZOLE 150 MG PO TABS: 150.0000 mg | ORAL_TABLET | Freq: Every day | ORAL | Status: DC

## 2015-05-03 MED ORDER — LEVOFLOXACIN 500 MG PO TABS: 500.0000 mg | ORAL_TABLET | Freq: Every day | ORAL | Status: DC

## 2015-05-03 NOTE — Progress Notes (Signed)
Ebony Lewis has acute sinusitis again Coughing up yellow sputum,  Occasionally  bloody Will give her Levaquin 500 mg a day for 10 day s. Also refill albuterol HFA  Follow up as needed      Nahser, [name], MD  05/03/2015 8:52 AM    [facility],  [place], [place]  [address] Pager [phone] Phone: [phone]; Fax: [phone]   [facility]  [address] [place], [place]  [address] [phone]   Fax [phone]

## 2015-05-08 ENCOUNTER — Other Ambulatory Visit: Payer: Self-pay | Admitting: Nurse Practitioner

## 2015-05-08 DIAGNOSIS — B379 Candidiasis, unspecified: Secondary | ICD-10-CM

## 2015-05-08 MED ORDER — FLUCONAZOLE 150 MG PO TABS: 150.0000 mg | ORAL_TABLET | Freq: Every day | ORAL | Status: DC

## 2015-05-20 ENCOUNTER — Ambulatory Visit (INDEPENDENT_AMBULATORY_CARE_PROVIDER_SITE_OTHER): Payer: 59 | Admitting: Family Medicine

## 2015-05-20 ENCOUNTER — Ambulatory Visit (HOSPITAL_COMMUNITY)
Admission: RE | Admit: 2015-05-20 | Discharge: 2015-05-20 | Disposition: A | Discharge status: Home / Self Care | Payer: 59 | Source: Ambulatory Visit | Attending: Family Medicine | Admitting: Family Medicine

## 2015-05-20 ENCOUNTER — Ambulatory Visit (INDEPENDENT_AMBULATORY_CARE_PROVIDER_SITE_OTHER): Payer: 59

## 2015-05-20 VITALS — BP 120/80 | HR 80 | Temp 98.2°F | Resp 20 | Ht 68.0 in | Wt 181.0 lb

## 2015-05-20 DIAGNOSIS — J329 Chronic sinusitis, unspecified: Secondary | ICD-10-CM

## 2015-05-20 DIAGNOSIS — R0602 Shortness of breath: Secondary | ICD-10-CM | POA: Diagnosis not present

## 2015-05-20 DIAGNOSIS — R05 Cough: Secondary | ICD-10-CM

## 2015-05-20 DIAGNOSIS — D72829 Elevated white blood cell count, unspecified: Secondary | ICD-10-CM

## 2015-05-20 DIAGNOSIS — R059 Cough, unspecified: Secondary | ICD-10-CM

## 2015-05-20 DIAGNOSIS — R110 Nausea: Secondary | ICD-10-CM

## 2015-05-20 DIAGNOSIS — J0181 Other acute recurrent sinusitis: Secondary | ICD-10-CM | POA: Diagnosis not present

## 2015-05-20 DIAGNOSIS — J385 Laryngeal spasm: Secondary | ICD-10-CM

## 2015-05-20 DIAGNOSIS — R1031 Right lower quadrant pain: Secondary | ICD-10-CM | POA: Insufficient documentation

## 2015-05-20 DIAGNOSIS — R1110 Vomiting, unspecified: Secondary | ICD-10-CM | POA: Insufficient documentation

## 2015-05-20 DIAGNOSIS — J4540 Moderate persistent asthma, uncomplicated: Secondary | ICD-10-CM | POA: Diagnosis not present

## 2015-05-20 DIAGNOSIS — R102 Pelvic and perineal pain: Secondary | ICD-10-CM

## 2015-05-20 LAB — POCT URINALYSIS DIP (MANUAL ENTRY)
Bilirubin, UA: NEGATIVE
Blood, UA: NEGATIVE
Glucose, UA: NEGATIVE
Leukocytes, UA: NEGATIVE
Nitrite, UA: NEGATIVE
Protein Ur, POC: NEGATIVE
Spec Grav, UA: >=1.03
Urobilinogen, UA: 0.2
pH, UA: 5

## 2015-05-20 LAB — POCT CBC
Granulocyte percent: 75.9 %G (ref 37–80)
HCT, POC: 41 % (ref 37.7–47.9)
Hemoglobin: 13.8 g/dL (ref 12.2–16.2)
Lymph, poc: 2.8 (ref 0.6–3.4)
MCH, POC: 29.6 pg (ref 27–31.2)
MCHC: 33.7 g/dL (ref 31.8–35.4)
MCV: 87.9 fL (ref 80–97)
MID (cbc): 0.9 (ref 0–0.9)
MPV: 7.5 fL (ref 0–99.8)
POC Granulocyte: 11.6 — AB (ref 2–6.9)
POC LYMPH PERCENT: 18.3 %L (ref 10–50)
POC MID %: 5.8 %M (ref 0–12)
Platelet Count, POC: 340 10*3/uL (ref 142–424)
RBC: 4.67 M/uL (ref 4.04–5.48)
RDW, POC: 13 %
WBC: 15.3 10*3/uL — AB (ref 4.6–10.2)

## 2015-05-20 LAB — COMPLETE METABOLIC PANEL WITH GFR
ALT: 10 U/L (ref 6–29)
AST: 14 U/L (ref 10–30)
Albumin: 4.2 g/dL (ref 3.6–5.1)
Alkaline Phosphatase: 85 U/L (ref 33–115)
BUN: 6 mg/dL — ABNORMAL LOW (ref 7–25)
CO2: 26 mmol/L (ref 20–31)
Calcium: 9 mg/dL (ref 8.6–10.2)
Chloride: 104 mmol/L (ref 98–110)
Creat: 0.75 mg/dL (ref 0.50–1.10)
GFR, Est African American: >89 mL/min (ref >=60)
GFR, Est Non African American: >89 mL/min (ref >=60)
Glucose, Bld: 88 mg/dL (ref 65–99)
Potassium: 4.2 mmol/L (ref 3.5–5.3)
Sodium: 136 mmol/L (ref 135–146)
Total Bilirubin: 0.4 mg/dL (ref 0.2–1.2)
Total Protein: 6.9 g/dL (ref 6.1–8.1)

## 2015-05-20 LAB — POC MICROSCOPIC URINALYSIS (UMFC)

## 2015-05-20 LAB — POCT URINE PREGNANCY: Preg Test, Ur: NEGATIVE

## 2015-05-20 MED ORDER — IOHEXOL 300 MG/ML  SOLN
100.0000 mL | Freq: Once | INTRAMUSCULAR | Status: AC | PRN
  Administered 2015-05-20: 100 mL via INTRAVENOUS

## 2015-05-20 MED ORDER — LEVOFLOXACIN 500 MG PO TABS: 500.0000 mg | ORAL_TABLET | Freq: Every day | ORAL | Status: DC

## 2015-05-20 MED ORDER — MOMETASONE FUROATE 220 MCG/INH IN AEPB: 1.0000 | INHALATION_SPRAY | Freq: Two times a day (BID) | RESPIRATORY_TRACT | Status: DC

## 2015-05-20 MED ORDER — ALBUTEROL SULFATE (2.5 MG/3ML) 0.083% IN NEBU
2.5000 mg | INHALATION_SOLUTION | Freq: Once | RESPIRATORY_TRACT | Status: AC
  Administered 2015-05-20: 2.5 mg via RESPIRATORY_TRACT

## 2015-05-20 MED ORDER — IPRATROPIUM BROMIDE 0.02 % IN SOLN
0.5000 mg | Freq: Once | RESPIRATORY_TRACT | Status: AC
  Administered 2015-05-20: 0.5 mg via RESPIRATORY_TRACT

## 2015-05-20 NOTE — Patient Instructions (Signed)
For your asthma, start Asmanex 1 puff twice per day. As control improves, maybe 1 cup back to once per day. Continue the albuterol only as needed for breakthrough wheezing or symptoms. If your albuterol was not lasting for 4 hours, return to the clinic or the emergency room..  Follow-up for your asthma within the next 3-4 weeks to determine if spirometry or pulmonary evaluation needed. Sooner or to emergency room if worse.  For your abdominal pain, your white blood cell count is elevated today, and based on the location of your pain, and nausea, appendicitis or other infection is possible. We will schedule a CT scan today, and will call you once we have the results available. Nothing to eat or drink for now except for the contrast needed for the CT scan.  For the sinuses, we will need to determine course of action for your abdominal pain first to determine if okay to start antibiotics.

## 2015-05-20 NOTE — Progress Notes (Signed)
Subjective:  This chart was scribed for Merri Ray, MD by Thea Alken, ED Scribe. This patient was seen in room 1 and the patient's care was started at 12:00 PM.   Patient ID: Ebony Lewis, female    DOB: 1985-09-05, 29 y.o.   MRN: [id number]  HPI   Chief Complaint  Patient presents with  . Abdominal Pain    today morning  . Sore Throat  . Cough   HPI Comments: Ebony Lewis is a 29 y.o. female who presents to the [facility] complaining of sinus congestion. Pt states she had sinus surgery 10/2014 by Dr. Lula Olszewski at [facility]. She has been trying get an appointment with Dr. Lula Olszewski but he is currently booked for the next 2 weeks. Her symptoms started 10 days ago with upper dental pain, HA, right sinus congestion with yellow/green drianage, sinus pressure and cough with SOB and wheezing. She has been using her albuterol inhaler 2 puff every 2 hours but is prescribed 2 puffs every 4-6 hours. No sore throat.    She also has gradually worsening, intermittent, right lower abdominal pain that began 6 days ago. Pt states abdominal pain initially started as aching pain that worsened to severe cramping pain that woke her up at 8am this morning. She has associated nausea, sweats and constipation. She was seen by OBGYN for similar pain 1 month ago and was told that if pain persisted she would need an ultrasound. LMP -10/23 which was lighter than usual. Hx of tubal ligation 12/2013. She has 2 children 6 and 2 y.o. No new sexual partners. No hx of STI. No hx of cholecystectomy and appendectomy. Occasional wine drinker. She is a former smoker. She denies fever, dysuria, hematuria, vaginal discharge, vaginal bleeding.   Pt works for a cardiologist.   Pt states she is unable to take prednisone, causes her to retain fluid and swell  Patient Active Problem List   Diagnosis Date Noted  . Near syncope 03/23/2015  . Temporomandibular joint (TMJ) pain 01/09/2015  . Encounter to establish  care 09/19/2014  . Arthralgia 09/19/2014  . Chronic infection of sinus 09/19/2014  . Asthma, moderate persistent 06/28/2014  . Seasonal allergies 06/28/2014  . History of seizures 05/06/2014  . Abdominal pain 08/05/2013  . Edema 08/05/2013  . Family history of ischemic heart disease 08/05/2013  . PALPITATIONS 08/01/2010   Past Medical History  Diagnosis Date  . Asthma   . Allergy   . Blood transfusion without reported diagnosis   . Depression   . Heart murmur   . Anxiety     Pt states PTSD s/p post delivery hemorrhage.   Past Surgical History  Procedure Laterality Date  . Dilation and curettage of uterus      EMERGENCY  . Tubal ligation    . Image guided sinus surgery N/A 11/30/2014    Procedure: IMAGE GUIDED SINUS SURGERY;  Surgeon: Carloyn Manner, MD;  Location: [place];  Service: ENT;  Laterality: N/A;  . Septoplasty with ethmoidectomy, and maxillary antrostomy Bilateral 11/30/2014    Procedure: SEPTOPLASTY WITH ETHMOIDECTOMY, AND MAXILLARY ANTROSTOMY;  Surgeon: Carloyn Manner, MD;  Location: [place];  Service: ENT;  Laterality: Bilateral;  . Endoscopic turbinate reduction Bilateral 11/30/2014    Procedure: ENDOSCOPIC TURBINATE REDUCTION;  Surgeon: Carloyn Manner, MD;  Location: [place];  Service: ENT;  Laterality: Bilateral;  . Sphenoidectomy Bilateral 11/30/2014    Procedure: Coralee Pesa;  Surgeon: Carloyn Manner, MD;  Location: [facility];  Service: ENT;  Laterality: Bilateral;  . Frontal sinus exploration Left 11/30/2014    Procedure: FRONTAL SINUS EXPLORATION;  Surgeon: Carloyn Manner, MD;  Location: [place];  Service: ENT;  Laterality: Left;  . Nasal sinus surgery  11/2014   Allergies  Allergen Reactions  . Amoxicillin Diarrhea and Rash     Severe diarrhea and rash. Augmentin also.   Prior to Admission medications   Medication Sig Start Date End Date Taking? Authorizing Provider  albuterol (VENTOLIN  HFA) 108 (90 BASE) MCG/ACT inhaler Inhale 2 puffs into the lungs as needed. 01/19/15  Yes Thayer Headings, MD  fluconazole (DIFLUCAN) 150 MG tablet Take 1 tablet (150 mg total) by mouth daily. 05/08/15  Yes Thayer Headings, MD  fluticasone (FLONASE) 50 MCG/ACT nasal spray Place 2 sprays into both nostrils as needed for allergies or rhinitis.   Yes Historical Provider, MD  HYDROcodone-acetaminophen (NORCO/VICODIN) 5-325 MG per tablet Take 1 tablet by mouth every 6 (six) hours as needed for moderate pain. 01/01/15  Yes Wardell Honour, MD  oxyCODONE-acetaminophen (ROXICET) 5-325 MG per tablet Take 1-2 tablets by mouth every 4 (four) hours as needed for severe pain. 11/30/14  Yes Carloyn Manner, MD  promethazine (PHENERGAN) 12.5 MG tablet Take 1 tablet (12.5 mg total) by mouth every 6 (six) hours as needed for nausea or vomiting. 11/30/14  Yes Carloyn Manner, MD  beclomethasone (QVAR) 40 MCG/ACT inhaler Inhale 2 puffs into the lungs 2 (two) times daily. Patient not taking: Reported on 11/30/2014 09/22/14   Mancel Bale, PA-C  levofloxacin (LEVAQUIN) 500 MG tablet Take 1 tablet (500 mg total) by mouth daily. Patient not taking: Reported on 05/20/2015 05/03/15   Thayer Headings, MD  mometasone-formoterol [facility]) 100-5 MCG/ACT AERO Inhale 2 puffs into the lungs 2 (two) times daily. Patient not taking: Reported on 11/30/2014 09/13/14   Mancel Bale, PA-C  sulfamethoxazole-trimethoprim (BACTRIM DS,SEPTRA DS) 800-160 MG per tablet Take 1 tablet by mouth 2 (two) times daily. Patient not taking: Reported on 03/23/2015 11/30/14   Carloyn Manner, MD   Social History   Social History  . Marital Status: Single    Spouse Name: N/A  . Number of Children: N/A  . Years of Education: N/A   Occupational History  . Not on file.   Social History Main Topics  . Smoking status: Former Smoker    Quit date: 11/13/2011  . Smokeless tobacco: Not on file     Comment: Started age 28. Smokes 5-7 cigs weekly   . Alcohol  Use: 0.6 oz/week    1 Glasses of wine, 0 Standard drinks or equivalent per week     Comment: Socially   . Drug Use: No  . Sexual Activity:    Partners: Male     Comment: Fiance   Other Topics Concern  . 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 Negative for fever and chills.  HENT: Positive for congestion, dental problem and sinus pressure. Negative for sore throat.   Respiratory: Positive for cough, shortness of breath and wheezing.   Gastrointestinal: Positive for nausea, abdominal pain and constipation. Negative for vomiting.  Genitourinary: Negative for dysuria, hematuria, vaginal bleeding and vaginal discharge.  Neurological: Positive for headaches.   Objective:  Physical Exam  Constitutional: She is oriented to person, place, and time. She appears well-developed and well-nourished. No distress.  HENT:  Head: Normocephalic and atraumatic.  Right Ear: Hearing, tympanic membrane, external ear and ear canal normal.  Left Ear: Hearing, tympanic membrane, external ear and ear canal normal.  Nose: Right sinus exhibits maxillary sinus tenderness and frontal sinus tenderness. Left sinus exhibits no maxillary sinus tenderness and no frontal sinus tenderness.  Mouth/Throat: Oropharynx is clear and moist. No oropharyngeal exudate.  Minimal turbinate edema. ttp over R maxillary and frontal sinus.   Eyes: Conjunctivae and EOM are normal. Pupils are equal, round, and reactive to light.  Cardiovascular: Normal rate, regular rhythm, normal heart sounds and intact distal pulses.   No murmur heard. Pulmonary/Chest: Effort normal. No respiratory distress. She has wheezes ( expiratory). She has no rhonchi.  Upper air way noise on deep inspiration. Afterwards felt as if she was going to pass out. Speaking in  full sentences, improved with some slow breathing in few minutes without intervention.   Abdominal: There is tenderness in the right lower quadrant. There is negative Murphy's sign.  Lymphadenopathy:    She has no cervical adenopathy.  Neurological: She is alert and oriented to person, place, and time.  Skin: Skin is warm and dry. No rash noted.  Psychiatric: She has a normal mood and affect. Her behavior is normal.  Vitals reviewed.   Filed Vitals:   05/20/15 1125  BP: 120/80  Pulse: 87  Temp: 98.2 F (36.8 C)  TempSrc: Oral  Resp: 20  Height: 5\' 8"  (1.727 m)  Weight: 181 lb (82.101 kg)  SpO2: 98%   Results for orders placed or performed in visit on 05/20/15  POCT CBC  Result Value Ref Range   WBC 15.3 (A) 4.6 - 10.2 K/uL   Lymph, poc 2.8 0.6 - 3.4   POC LYMPH PERCENT 18.3 10 - 50 %L   MID (cbc) 0.9 0 - 0.9   POC MID % 5.8 0 - 12 %M   POC Granulocyte 11.6 (A) 2 - 6.9   Granulocyte percent 75.9 37 - 80 %G   RBC 4.67 4.04 - 5.48 M/uL   Hemoglobin 13.8 12.2 - 16.2 g/dL   HCT, POC 41.0 37.7 - 47.9 %   MCV 87.9 80 - 97 fL   MCH, POC 29.6 27 - 31.2 pg   MCHC 33.7 31.8 - 35.4 g/dL   RDW, POC 13.0 %   Platelet Count, POC 340 142 - 424 K/uL   MPV 7.5 0 - 99.8 fL  POCT urinalysis dipstick  Result Value Ref Range   Color, UA yellow yellow   Clarity, UA hazy (A) clear   Glucose, UA negative negative   Bilirubin, UA negative negative   Ketones, POC UA trace (5) (A) negative   Spec Grav, UA >=1.030    Blood, UA negative negative   pH, UA 5.0    Protein Ur, POC negative negative   Urobilinogen, UA 0.2    Nitrite, UA Negative Negative   Leukocytes, UA Negative Negative  POCT urine pregnancy  Result Value Ref Range   Preg Test, Ur Negative Negative  POCT Microscopic Urinalysis ([facility])  Result Value Ref Range   WBC,UR,HPF,POC None None WBC/hpf   RBC,UR,HPF,POC None None RBC/hpf   Bacteria Few (A) None, Too numerous to count   Mucus Present (A) Absent   Epithelial Cells,  UR Per Microscopy Few (A) None, Too numerous to count cells/hpf   [facility] reading (  PRIMARY) by  Dr. Carlota Raspberry: CXR: no acute findings, mild hyperexpansion.   Initial peak flow 280 -Improved to 440 (predicted around 420) after albuterol and atrovent neb - lungs clear. Symptomatically improved.    Assessment & Plan:   Ebony Lewis is a 29 y.o. female RLQ abdominal pain -Leukocytosis, Nausea without vomiting - Plan: CT Abdomen Pelvis W Contrast  Plan: POCT CBC, POCT urinalysis dipstick, POCT urine pregnancy, POCT Microscopic Urinalysis ([facility]), COMPLETE METABOLIC PANEL WITH GFR, CT Abdomen Pelvis W Contrast  -reportedly had eval with GYn with plan of possible ultrasound if pain perists, but acute worsening of pain this morning with nausea and leukocytosis - ddx includes appendicitis.   -CT abdomen today, then if normal could check ultrasound.   -NPO for now.   Cough - Plan: POCT CBC, DG Chest 2 View, Asthma, moderate persistent, poorly-controlled, Shortness of breath - Plan: POCT CBC, DG Chest 2 View, albuterol (PROVENTIL) (2.5 MG/3ML) 0.083% nebulizer solution 2.5 mg, ipratropium (ATROVENT) nebulizer solution 0.5 mg, mometasone (ASMANEX 60 METERED DOSES) 220 MCG/INH inhaler  -uncontrolled asthma with inappropriate/too frequent dosing. Improved aeration and sx's with nebs above.   -start asmanex inh BID, then as sx's improve - change to QD.   Chronic sinusitis, unspecified location - Plan: POCT CBC  -chronic sinusitis under care of ENT, now with possible acute on chronic flare.   Will determine abx once abdominal CT results and plan known.   Laryngospasm  -during exam.  Cleared on own. Appeared anxious during this episode, but no LOC, no intervention needed.    4:02 PM CT noted - no acute findings. Attempted call to cell phone - no answer and VM not set up. Plan on levaquin for sinusitis, schedule abd/pelvic US and repeat CBC in next 48-72 hrs - sooner if fever or worsening. Will try to reach  again later.   4:54 PM CT results discussed with patient. She reports hx of leukocytosis with prior sinus infections. Will start levaquin 500mg  QD for 10days (sed, tendinopathy risks advised), plan on repeat eval in 48-72 hrs for repeat abd exam and CBC, and order pelvic US next week. rtc sooner if worsening - understanding expressed.   Meds ordered this encounter  Medications  . albuterol (PROVENTIL) (2.5 MG/3ML) 0.083% nebulizer solution 2.5 mg    Sig:   . ipratropium (ATROVENT) nebulizer solution 0.5 mg    Sig:   . mometasone (ASMANEX 60 METERED DOSES) 220 MCG/INH inhaler    Sig: Inhale 1 puff into the lungs 2 (two) times daily.    Dispense:  1 Inhaler    Refill:  3   Patient Instructions  For your asthma, start Asmanex 1 puff twice per day. As control improves, maybe 1 cup back to once per day. Continue the albuterol only as needed for breakthrough wheezing or symptoms. If your albuterol was not lasting for 4 hours, return to the clinic or the emergency room..  Follow-up for your asthma within the next 3-4 weeks to determine if spirometry or pulmonary evaluation needed. Sooner or to emergency room if worse.  For your abdominal pain, your white blood cell count is elevated today, and based on the location of your pain, and nausea, appendicitis or other infection is possible. We will schedule a CT scan today, and will call you once we have the results available. Nothing to eat or drink for now except for the contrast needed for the CT scan.  For the sinuses, we will need to determine course of action  for your abdominal pain first to determine if okay to start antibiotics.

## 2015-05-22 ENCOUNTER — Other Ambulatory Visit: Payer: Self-pay | Admitting: Cardiovascular Disease

## 2015-05-22 DIAGNOSIS — R062 Wheezing: Secondary | ICD-10-CM

## 2015-05-22 MED ORDER — ALBUTEROL SULFATE HFA 108 (90 BASE) MCG/ACT IN AERS: 2.0000 | INHALATION_SPRAY | RESPIRATORY_TRACT | Status: DC | PRN

## 2015-05-22 MED ORDER — METHYLPREDNISOLONE 4 MG PO TBPK: ORAL_TABLET | ORAL | Status: DC

## 2015-05-22 NOTE — Progress Notes (Signed)
Jerome continues to have severe wheezing and shortness of breath Will renew her albuterol and give her a steroid dose pack     Nahser, [name], MD  05/22/2015 10:12 AM    [facility] [place],  [place], [place]  [address] Pager [phone] Phone: [phone]; Fax: [phone]   [facility]  [address] [place], [place]  [address] [phone]   Fax [phone]

## 2015-05-23 ENCOUNTER — Other Ambulatory Visit: Payer: Self-pay

## 2015-05-23 DIAGNOSIS — M25551 Pain in right hip: Secondary | ICD-10-CM

## 2015-06-02 ENCOUNTER — Other Ambulatory Visit: Payer: Self-pay | Admitting: Nurse Practitioner

## 2015-06-02 DIAGNOSIS — B379 Candidiasis, unspecified: Secondary | ICD-10-CM

## 2015-06-02 MED ORDER — FLUCONAZOLE 150 MG PO TABS: 150.0000 mg | ORAL_TABLET | Freq: Every day | ORAL | Status: DC

## 2015-06-05 ENCOUNTER — Other Ambulatory Visit: Payer: Self-pay | Admitting: Nurse Practitioner

## 2015-06-05 ENCOUNTER — Other Ambulatory Visit (HOSPITAL_COMMUNITY): Payer: Self-pay | Admitting: Family

## 2015-06-05 ENCOUNTER — Ambulatory Visit (HOSPITAL_COMMUNITY)
Admission: RE | Admit: 2015-06-05 | Discharge: 2015-06-05 | Disposition: A | Discharge status: Home / Self Care | Payer: 59 | Source: Ambulatory Visit | Attending: Family | Admitting: Family

## 2015-06-05 DIAGNOSIS — R0602 Shortness of breath: Secondary | ICD-10-CM | POA: Insufficient documentation

## 2015-06-05 DIAGNOSIS — J45991 Cough variant asthma: Secondary | ICD-10-CM

## 2015-06-05 DIAGNOSIS — J329 Chronic sinusitis, unspecified: Secondary | ICD-10-CM

## 2015-06-05 DIAGNOSIS — D72829 Elevated white blood cell count, unspecified: Secondary | ICD-10-CM

## 2015-06-05 LAB — CBC WITH DIFFERENTIAL/PLATELET
Basophils Absolute: 0 10*3/uL (ref 0.0–0.1)
Basophils Relative: 0 % (ref 0–1)
Eosinophils Absolute: 1.1 10*3/uL — ABNORMAL HIGH (ref 0.0–0.7)
Eosinophils Relative: 9 % — ABNORMAL HIGH (ref 0–5)
HCT: 37.6 % (ref 36.0–46.0)
Hemoglobin: 12.5 g/dL (ref 12.0–15.0)
Lymphocytes Relative: 15 % (ref 12–46)
Lymphs Abs: 1.9 10*3/uL (ref 0.7–4.0)
MCH: 29.8 pg (ref 26.0–34.0)
MCHC: 33.2 g/dL (ref 30.0–36.0)
MCV: 89.5 fL (ref 78.0–100.0)
MPV: 9.9 fL (ref 8.6–12.4)
Monocytes Absolute: 1.6 10*3/uL — ABNORMAL HIGH (ref 0.1–1.0)
Monocytes Relative: 13 % — ABNORMAL HIGH (ref 3–12)
Neutro Abs: 7.9 10*3/uL — ABNORMAL HIGH (ref 1.7–7.7)
Neutrophils Relative %: 63 % (ref 43–77)
Platelets: 295 10*3/uL (ref 150–400)
RBC: 4.2 MIL/uL (ref 3.87–5.11)
RDW: 13.3 % (ref 11.5–15.5)
WBC: 12.6 10*3/uL — ABNORMAL HIGH (ref 4.0–10.5)

## 2015-06-05 LAB — COMPREHENSIVE METABOLIC PANEL
ALT: 11 U/L (ref 6–29)
AST: 13 U/L (ref 10–30)
Albumin: 4.1 g/dL (ref 3.6–5.1)
Alkaline Phosphatase: 70 U/L (ref 33–115)
BUN: 10 mg/dL (ref 7–25)
CO2: 26 mmol/L (ref 20–31)
Calcium: 9.4 mg/dL (ref 8.6–10.2)
Chloride: 106 mmol/L (ref 98–110)
Creat: 0.73 mg/dL (ref 0.50–1.10)
Glucose, Bld: 79 mg/dL (ref 65–99)
Potassium: 3.4 mmol/L — ABNORMAL LOW (ref 3.5–5.3)
Sodium: 140 mmol/L (ref 135–146)
Total Bilirubin: 0.3 mg/dL (ref 0.2–1.2)
Total Protein: 6.6 g/dL (ref 6.1–8.1)

## 2015-06-06 ENCOUNTER — Other Ambulatory Visit (INDEPENDENT_AMBULATORY_CARE_PROVIDER_SITE_OTHER): Payer: 59 | Admitting: *Deleted

## 2015-06-06 DIAGNOSIS — D72829 Elevated white blood cell count, unspecified: Secondary | ICD-10-CM | POA: Diagnosis not present

## 2015-09-18 MED FILL — VENTOLIN HFA 90 MCG INHALER: 108 (90 BAS | 30 days supply | Qty: 18 | Fill #1

## 2015-10-17 ENCOUNTER — Other Ambulatory Visit: Payer: Self-pay | Admitting: Nurse Practitioner

## 2015-10-17 ENCOUNTER — Other Ambulatory Visit: Payer: Self-pay | Admitting: Cardiovascular Disease

## 2015-10-17 DIAGNOSIS — R062 Wheezing: Secondary | ICD-10-CM

## 2015-10-17 DIAGNOSIS — B958 Unspecified staphylococcus as the cause of diseases classified elsewhere: Secondary | ICD-10-CM

## 2015-10-17 MED ORDER — DOXYCYCLINE HYCLATE 100 MG PO CAPS: 100.0000 mg | ORAL_CAPSULE | Freq: Two times a day (BID) | ORAL | Status: DC

## 2015-10-17 MED ORDER — ALBUTEROL SULFATE HFA 108 (90 BASE) MCG/ACT IN AERS: 2.0000 | INHALATION_SPRAY | RESPIRATORY_TRACT | Status: DC | PRN

## 2015-10-17 MED ORDER — MUPIROCIN 2 % EX OINT: 1.0000 "application " | TOPICAL_OINTMENT | Freq: Two times a day (BID) | CUTANEOUS | Status: DC

## 2015-10-17 MED FILL — MUPIROCIN 2% OINTMENT: 2 | 10 days supply | Qty: 22 | Fill #0

## 2015-10-17 MED FILL — VENTOLIN HFA 90 MCG INHALER: 108 (90 BAS | 30 days supply | Qty: 18 | Fill #2

## 2015-10-17 MED FILL — DOXYCYCLINE HYC 100 MG CAP: 100 | 10 days supply | Qty: 20 | Fill #0

## 2015-10-17 NOTE — Progress Notes (Signed)
Ebony Lewis has an infection in the lower / mid back [name], RN was able to drain a small amount of pus / blood There is surounding inflamed tissue.  No oder to the drainage. No hx of fevers.   Looks like a staph infection.   Will give her Doxycycline 100 BID for 10 days and Mupirocin ointment BID Will recheck in several days .    Nahser, [name], MD  10/17/2015 1:43 PM    [facility] [place],  [place], [place]  [address] Pager [phone] Phone: [phone]; Fax: [phone]   [facility]  [address] [place], [place]  [address] [phone]   Fax [phone]

## 2015-10-20 MED FILL — FLUCONAZOLE 150 MG TABLET: 150 | 1 days supply | Qty: 1 | Fill #1

## 2015-12-07 MED FILL — VENTOLIN HFA 90 MCG INHALER: 108 (90 BAS | 30 days supply | Qty: 18 | Fill #0 | Status: TO

## 2015-12-15 ENCOUNTER — Other Ambulatory Visit: Payer: Self-pay | Admitting: Nurse Practitioner

## 2015-12-15 ENCOUNTER — Other Ambulatory Visit: Payer: Self-pay | Admitting: Cardiovascular Disease

## 2015-12-15 ENCOUNTER — Other Ambulatory Visit: Payer: 59 | Admitting: *Deleted

## 2015-12-15 DIAGNOSIS — N898 Other specified noninflammatory disorders of vagina: Secondary | ICD-10-CM

## 2015-12-17 LAB — NEISSERIA GONORRHOEAE, PROBE AMP: GC Probe RNA: NOT DETECTED

## 2015-12-17 LAB — CHLAMYDIA TRACHOMATIS, PROBE AMP: CT Probe RNA: NOT DETECTED

## 2016-02-15 DIAGNOSIS — R05 Cough: Secondary | ICD-10-CM | POA: Diagnosis not present

## 2016-02-15 DIAGNOSIS — J019 Acute sinusitis, unspecified: Secondary | ICD-10-CM | POA: Diagnosis not present

## 2016-02-26 DIAGNOSIS — Z113 Encounter for screening for infections with a predominantly sexual mode of transmission: Secondary | ICD-10-CM | POA: Diagnosis not present

## 2016-03-21 MED FILL — VENTOLIN HFA 90 MCG INHALER: 108 (90 BAS | 30 days supply | Qty: 18 | Fill #0

## 2016-04-02 ENCOUNTER — Other Ambulatory Visit: Payer: Self-pay | Admitting: Nurse Practitioner

## 2016-04-02 DIAGNOSIS — R062 Wheezing: Secondary | ICD-10-CM

## 2016-04-23 MED FILL — VENTOLIN HFA 90 MCG INHALER: 108 (90 BAS | 25 days supply | Qty: 18 | Fill #1

## 2016-05-02 ENCOUNTER — Ambulatory Visit
Admission: RE | Admit: 2016-05-02 | Discharge: 2016-05-02 | Disposition: A | Discharge status: Home / Self Care | Payer: 59 | Source: Ambulatory Visit | Attending: Family | Admitting: Family

## 2016-05-02 ENCOUNTER — Other Ambulatory Visit: Payer: Self-pay | Admitting: Family

## 2016-05-02 DIAGNOSIS — J189 Pneumonia, unspecified organism: Secondary | ICD-10-CM | POA: Diagnosis not present

## 2016-05-02 DIAGNOSIS — R0602 Shortness of breath: Secondary | ICD-10-CM | POA: Diagnosis not present

## 2016-05-02 DIAGNOSIS — J4541 Moderate persistent asthma with (acute) exacerbation: Secondary | ICD-10-CM | POA: Diagnosis not present

## 2016-05-03 ENCOUNTER — Other Ambulatory Visit: Payer: 59 | Admitting: *Deleted

## 2016-05-03 ENCOUNTER — Other Ambulatory Visit: Payer: Self-pay | Admitting: Cardiovascular Disease

## 2016-05-03 DIAGNOSIS — J189 Pneumonia, unspecified organism: Secondary | ICD-10-CM

## 2016-05-03 LAB — BASIC METABOLIC PANEL
BUN: 10 mg/dL (ref 7–25)
CO2: 20 mmol/L (ref 20–31)
Calcium: 9.1 mg/dL (ref 8.6–10.2)
Chloride: 108 mmol/L (ref 98–110)
Creat: 0.75 mg/dL (ref 0.50–1.10)
Glucose, Bld: 94 mg/dL (ref 65–99)
Potassium: 3.8 mmol/L (ref 3.5–5.3)
Sodium: 139 mmol/L (ref 135–146)

## 2016-05-03 LAB — CBC WITH DIFFERENTIAL/PLATELET
Basophils Absolute: 0 cells/uL (ref 0–200)
Basophils Relative: 0 %
Eosinophils Absolute: 294 cells/uL (ref 15–500)
Eosinophils Relative: 2 %
HCT: 38.9 % (ref 35.0–45.0)
Hemoglobin: 13.6 g/dL (ref 11.7–15.5)
Lymphocytes Relative: 20 %
Lymphs Abs: 2940 cells/uL (ref 850–3900)
MCH: 31.1 pg (ref 27.0–33.0)
MCHC: 35 g/dL (ref 32.0–36.0)
MCV: 89 fL (ref 80.0–100.0)
MPV: 9.5 fL (ref 7.5–12.5)
Monocytes Absolute: 1470 cells/uL — ABNORMAL HIGH (ref 200–950)
Monocytes Relative: 10 %
Neutro Abs: 9996 cells/uL — ABNORMAL HIGH (ref 1500–7800)
Neutrophils Relative %: 68 %
Platelets: 332 10*3/uL (ref 140–400)
RBC: 4.37 MIL/uL (ref 3.80–5.10)
RDW: 12.1 % (ref 11.0–15.0)
WBC: 14.7 10*3/uL — ABNORMAL HIGH (ref 3.8–10.8)

## 2016-05-03 LAB — TSH: TSH: 0.52 mIU/L

## 2016-05-03 NOTE — Progress Notes (Signed)
Dori has pneumonia. Was started on Levaquin Has developed leg and muscle weakness Has wheezes on exam  Will check CBC, TSH ,, BMP   Will need to go home and rest    Mertie Moores, MD  05/03/2016 9:01 AM    [facility] [address]  [place], [place]  [address] Pager [phone] Phone: [phone]; Fax: [phone]

## 2016-05-09 DIAGNOSIS — H5213 Myopia, bilateral: Secondary | ICD-10-CM | POA: Diagnosis not present

## 2016-05-09 DIAGNOSIS — H52223 Regular astigmatism, bilateral: Secondary | ICD-10-CM | POA: Diagnosis not present

## 2016-06-03 ENCOUNTER — Telehealth: Payer: 59 | Admitting: Family

## 2016-06-03 ENCOUNTER — Other Ambulatory Visit: Payer: Self-pay | Admitting: Nurse Practitioner

## 2016-06-03 ENCOUNTER — Encounter: Payer: Self-pay | Admitting: Nurse Practitioner

## 2016-06-03 DIAGNOSIS — R062 Wheezing: Secondary | ICD-10-CM

## 2016-06-03 DIAGNOSIS — J019 Acute sinusitis, unspecified: Secondary | ICD-10-CM | POA: Diagnosis not present

## 2016-06-03 MED ORDER — BENZONATATE 100 MG PO CAPS: 100.0000 mg | ORAL_CAPSULE | Freq: Two times a day (BID) | ORAL | 0 refills | Status: DC | PRN

## 2016-06-03 MED ORDER — PREDNISONE 10 MG (21) PO TBPK: ORAL_TABLET | ORAL | 0 refills | Status: DC

## 2016-06-03 MED ORDER — DOXYCYCLINE HYCLATE 100 MG PO TABS: 100.0000 mg | ORAL_TABLET | Freq: Two times a day (BID) | ORAL | 0 refills | Status: DC

## 2016-06-03 MED ORDER — ALBUTEROL SULFATE HFA 108 (90 BASE) MCG/ACT IN AERS: 2.0000 | INHALATION_SPRAY | RESPIRATORY_TRACT | 6 refills | Status: DC | PRN

## 2016-06-03 MED FILL — BENZONATATE 100 MG CAPSULE: 100 | 10 days supply | Qty: 20 | Fill #0

## 2016-06-03 MED FILL — predniSONE 10 MG TABS: 10 | 6 days supply | Qty: 21 | Fill #0

## 2016-06-03 MED FILL — VENTOLIN HFA 90 MCG INHALER: 108 (90 BAS | 25 days supply | Qty: 18 | Fill #0

## 2016-06-03 MED FILL — DOXYCYCLINE HYCLATE 100 MG: 100 | 10 days supply | Qty: 20 | Fill #0

## 2016-06-03 NOTE — Progress Notes (Signed)
We are sorry that you are not feeling well.  Here is how we plan to help!  Based on what you have shared with me it looks like you have upper respiratory tract inflammation that has resulted in a significant cough.  Inflammation and infection in the upper respiratory tract is commonly called bronchitis and has four common causes:  Allergies, Viral Infections, Acid Reflux and Bacterial Infections.  Allergies, viruses and acid reflux are treated by controlling symptoms or eliminating the cause. An example might be a cough caused by taking certain blood pressure medications. You stop the cough by changing the medication. Another example might be a cough caused by acid reflux. Controlling the reflux helps control the cough.  Based on your presentation I believe you most likely have A cough due to bacteria.  When patients have a fever and a productive cough with a change in color or increased sputum production, we are concerned about bacterial bronchitis.  If left untreated it can progress to pneumonia.  If your symptoms do not improve with your treatment plan it is important that you contact your provider.   I hve prescribed Doxycycline 100 mg twice a day for 7 days     In addition you may use A non-prescription cough medication called Robitussin DAC. Take 2 teaspoons every 8 hours or Delsym: take 2 teaspoons every 12 hours., A non-prescription cough medication called Mucinex DM: take 2 tablets every 12 hours. and A prescription cough medication called Tessalon Perles 100mg. You may take 1-2 capsules every 8 hours as needed for your cough.  Sterapred 10 mg dosepak  USE OF BRONCHODILATOR ("RESCUE") INHALERS: There is a risk from using your bronchodilator too frequently.  The risk is that over-reliance on a medication which only relaxes the muscles surrounding the breathing tubes can reduce the effectiveness of medications prescribed to reduce swelling and congestion of the tubes themselves.  Although you feel  brief relief from the bronchodilator inhaler, your asthma may actually be worsening with the tubes becoming more swollen and filled with mucus.  This can delay other crucial treatments, such as oral steroid medications. If you need to use a bronchodilator inhaler daily, several times per day, you should discuss this with your provider.  There are probably better treatments that could be used to keep your asthma under control.     HOME CARE . Only take medications as instructed by your medical team. . Complete the entire course of an antibiotic. . Drink plenty of fluids and get plenty of rest. . Avoid close contacts especially the very young and the elderly . Cover your mouth if you cough or cough into your sleeve. . Always remember to wash your hands . A steam or ultrasonic humidifier can help congestion.   GET HELP RIGHT AWAY IF: . You develop worsening fever. . You become short of breath . You cough up blood. . Your symptoms persist after you have completed your treatment plan MAKE SURE YOU   Understand these instructions.  Will watch your condition.  Will get help right away if you are not doing well or get worse.  Your e-visit answers were reviewed by a board certified advanced clinical practitioner to complete your personal care plan.  Depending on the condition, your plan could have included both over the counter or prescription medications. If there is a problem please reply  once you have received a response from your provider. Your safety is important to us.  If you have drug allergies   check your prescription carefully.    You can use MyChart to ask questions about today's visit, request a non-urgent call back, or ask for a work or school excuse for 24 hours related to this e-Visit. If it has been greater than 24 hours you will need to follow up with your provider, or enter a new e-Visit to address those concerns. You will get an e-mail in the next two days asking about your  experience.  I hope that your e-visit has been valuable and will speed your recovery. Thank you for using e-visits.   

## 2016-07-04 MED FILL — VENTOLIN HFA 90 MCG INHALER: 108 (90 BAS | 25 days supply | Qty: 18 | Fill #1

## 2016-07-06 ENCOUNTER — Ambulatory Visit (INDEPENDENT_AMBULATORY_CARE_PROVIDER_SITE_OTHER): Payer: 59 | Admitting: Family Medicine

## 2016-07-06 ENCOUNTER — Ambulatory Visit (INDEPENDENT_AMBULATORY_CARE_PROVIDER_SITE_OTHER): Payer: 59

## 2016-07-06 VITALS — BP 108/64 | HR 98 | Temp 98.5°F | Ht 68.0 in | Wt 167.8 lb

## 2016-07-06 DIAGNOSIS — J0101 Acute recurrent maxillary sinusitis: Secondary | ICD-10-CM

## 2016-07-06 DIAGNOSIS — R05 Cough: Secondary | ICD-10-CM | POA: Diagnosis not present

## 2016-07-06 DIAGNOSIS — J4541 Moderate persistent asthma with (acute) exacerbation: Secondary | ICD-10-CM

## 2016-07-06 DIAGNOSIS — J301 Allergic rhinitis due to pollen: Secondary | ICD-10-CM

## 2016-07-06 LAB — POCT CBC
Granulocyte percent: 70.8 %G (ref 37–80)
HCT, POC: 39.6 % (ref 37.7–47.9)
Hemoglobin: 13.8 g/dL (ref 12.2–16.2)
Lymph, poc: 2.3 (ref 0.6–3.4)
MCH, POC: 30.8 pg (ref 27–31.2)
MCHC: 34.8 g/dL (ref 31.8–35.4)
MCV: 88.6 fL (ref 80–97)
MID (cbc): 0.7 (ref 0–0.9)
MPV: 7.6 fL (ref 0–99.8)
POC Granulocyte: 7.4 — AB (ref 2–6.9)
POC LYMPH PERCENT: 22.4 %L (ref 10–50)
POC MID %: 6.8 %M (ref 0–12)
Platelet Count, POC: 307 10*3/uL (ref 142–424)
RBC: 4.47 M/uL (ref 4.04–5.48)
RDW, POC: 12.5 %
WBC: 10.4 10*3/uL — AB (ref 4.6–10.2)

## 2016-07-06 MED ORDER — MONTELUKAST SODIUM 10 MG PO TABS: 10.0000 mg | ORAL_TABLET | Freq: Every day | ORAL | 3 refills | Status: DC

## 2016-07-06 MED ORDER — PREDNISONE 20 MG PO TABS: ORAL_TABLET | ORAL | 0 refills | Status: DC

## 2016-07-06 MED ORDER — CEFDINIR 300 MG PO CAPS
600.0000 mg | ORAL_CAPSULE | Freq: Every day | ORAL | 0 refills | Status: DC
Start: 2016-07-06 — End: 2016-11-15

## 2016-07-06 NOTE — Patient Instructions (Addendum)
     IF you received an x-ray today, you will receive an invoice from [facility]. Please contact [facility] at [phone] with questions or concerns regarding your invoice.   IF you received labwork today, you will receive an invoice from [facility]. Please contact [facility] at [phone] with questions or concerns regarding your invoice.   Our billing staff will not be able to assist you with questions regarding bills from these companies.  You will be contacted with the lab results as soon as they are available. The fastest way to get your results is to activate your My Chart account. Instructions are located on the last page of this paperwork. If you have not heard from us regarding the results in 2 weeks, please contact this office.     

## 2016-07-06 NOTE — Progress Notes (Signed)
Subjective:    Patient ID: Ebony Lewis, female    DOB: [birth year], 30 y.o.   MRN: [id number]  07/06/2016  Cough (X 1week ) and Sinusitis (X 1 week)   HPI This 30 y.o. female presents for evaluation of cough and sinus congestion.  Onset of illness since 03/2016.  Sinus congestion for several months; now having horrible cough.  First abx was azithromycin x 10 days.  S/p sinus surgery; trying to get in to see ENT; appointment with him not scheduled; must call back after Christmas to see when Dr. Pryor Ochoa has available.  Very SOB; taking nebulizers for weeks and months.  Has taken four different rounds of prednisone.  Will improve but never completely goes away; abx and prednisone helps temporarily. Augmentin followed by Doxycycline followed by Levaquin.   Onset of asthma since age 89.  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  Coughing just started two weeks ago.  Prior to two weeks ago, having strictly sinus congestion.  Using Albuterol currently four times per day HFA.  Also taking nebulizers frequently throughout the night; uses nebulizer twice during the day; nebulizer 3 times per night.  Last abx evisit 3 weeks ago.  Last prednisone 3 weeks ago.   Last nebulizer today this am.  Sinus congestion great in 2016 after sinus surgery.  Received flu vaccine 03/19/16 at work; then developed sinus congestion after flu vaccine.  Has tried egg free flu vaccine; has tried other flu vaccines in the past.  With treatments recently, gets better but it recurs.  Denies reflux symptoms.  Symbicort 2 puffs bid.   Albuterol as needed. Nebulizer every 4-6 hours as needed. S/p allergy testing; allergy shots for five years as a child; some improvement. Claritin 10mg  daily; Zyrtec 10mg  every morning; Flonase daily. Also taking Mucinex over the counter. Also taking Pseudoephedrine.   Cough drops also.   Partner is being treated for lymphoma currently; pt's health is taking priority. 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  Allergist:  None Pulmonologist: none; plans to establish with [facility].    Heide Scales, [facility].Recently tested pt for alpha1 antitrypsin.  Quit smoking three months.   Review of Systems  Constitutional: Negative for chills, diaphoresis, fatigue and fever.  HENT: Positive for congestion, postnasal drip, rhinorrhea and voice change. Negative for ear pain, sinus pressure, sore throat and trouble swallowing.   Respiratory: Positive for cough, shortness of breath and wheezing.   Cardiovascular: Negative for chest pain, palpitations and leg swelling.  Gastrointestinal: Negative for abdominal pain, constipation, diarrhea, nausea and vomiting.    Past Medical History:  Diagnosis Date  . Allergy   . Anxiety    Pt states PTSD s/p post delivery hemorrhage.  . Asthma   . Blood transfusion without reported diagnosis   . Depression   . Heart murmur    Past Surgical History:  Procedure Laterality Date  . DILATION AND CURETTAGE OF UTERUS     EMERGENCY  . ENDOSCOPIC TURBINATE REDUCTION Bilateral 11/30/2014   Procedure: ENDOSCOPIC TURBINATE REDUCTION;  Surgeon: Carloyn Manner, MD;  Location: [place];  Service: ENT;  Laterality: Bilateral;  . FRONTAL SINUS EXPLORATION Left 11/30/2014   Procedure: FRONTAL SINUS EXPLORATION;  Surgeon: Carloyn Manner, MD;  Location: [place];  Service: ENT;  Laterality: Left;  . IMAGE GUIDED SINUS SURGERY N/A 11/30/2014   Procedure: IMAGE GUIDED SINUS SURGERY;  Surgeon: Carloyn Manner, MD;  Location: [place];  Service: ENT;  Laterality: N/A;  .  NASAL SINUS SURGERY  11/2014  . SEPTOPLASTY WITH ETHMOIDECTOMY, AND MAXILLARY ANTROSTOMY Bilateral 11/30/2014   Procedure: SEPTOPLASTY WITH ETHMOIDECTOMY, AND MAXILLARY ANTROSTOMY;  Surgeon: Carloyn Manner, MD;  Location: [place];  Service: ENT;  Laterality: Bilateral;  . SPHENOIDECTOMY Bilateral 11/30/2014   Procedure: SPHENOIDECTOMY;   Surgeon: Carloyn Manner, MD;  Location: [place];  Service: ENT;  Laterality: Bilateral;  . TUBAL LIGATION     Allergies  Allergen Reactions  . Bactrim [Sulfamethoxazole-Trimethoprim] Other (See Comments)  . Amoxicillin Diarrhea and Rash     Severe diarrhea and rash. Augmentin also.    Social History   Social History  . Marital status: Single    Spouse name: N/A  . Number of children: N/A  . Years of education: N/A   Occupational History  . Not on file.   Social History Main Topics  . Smoking status: Former Smoker    Quit date: 11/13/2011  . Smokeless tobacco: Never Used     Comment: Started age 89. Smokes 5-7 cigs weekly   . Alcohol use 0.6 oz/week    1 Glasses of wine per week     Comment: Socially   . Drug use: No  . Sexual activity: Yes    Partners: Male     Comment: Fiance   Other Topics Concern  . 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 Heart murmur Father   . Hypertension Father   . Anxiety disorder Father   . Hyperlipidemia Father   . Coronary artery disease Maternal Grandmother   . Fibromyalgia Maternal Grandmother   . Cancer Maternal Grandmother     Melanoma, cervical, ovarian  . Cancer Mother 75    Lung Cancer  . Diabetes Paternal Grandfather   . Heart disease Paternal Grandfather   . Diabetes Maternal Grandfather   . Hypertension Maternal Grandfather   . Cancer Paternal Grandmother     Stage III lung cancer  . Multiple sclerosis Paternal Grandmother        Objective:    BP 108/64 (BP Location: Right Arm, Patient Position: Sitting, Cuff Size: Small)   Pulse 98   Temp 98.5 F (36.9 C) (Oral)   Ht 5\' 8"  (1.727 m)   Wt 167 lb 12.8 oz (76.1 kg)   LMP 06/29/2016 (Exact Date)   SpO2 99%   BMI 25.51 kg/m  Physical Exam  Constitutional: She is  oriented to person, place, and time. She appears well-developed and well-nourished. No distress.  HENT:  Head: Normocephalic and atraumatic.  Right Ear: External ear normal. Tympanic membrane is retracted.  Left Ear: External ear normal. Tympanic membrane is retracted.  Nose: Mucosal edema present. Right sinus exhibits maxillary sinus tenderness and frontal sinus tenderness. Left sinus exhibits maxillary sinus tenderness and frontal sinus tenderness.  Mouth/Throat: Uvula is midline and mucous membranes are normal. Posterior oropharyngeal erythema present.  Eyes: Conjunctivae are normal. Pupils are equal, round, and reactive to light.  Neck: Normal range of motion. Neck supple.  Cardiovascular: Normal rate, regular rhythm and normal heart sounds.  Exam reveals no gallop and no friction rub.   No murmur heard. Pulmonary/Chest: Effort normal and breath sounds normal. She has no wheezes. She has no rales.  BArking cough yet good air movement.  Neurological: She  is alert and oriented to person, place, and time.  Skin: She is not diaphoretic.  Psychiatric: She has a normal mood and affect. Her behavior is normal.  Nursing note and vitals reviewed.  Results for orders placed or performed in visit on 07/06/16  POCT CBC  Result Value Ref Range   WBC 10.4 (A) 4.6 - 10.2 K/uL   Lymph, poc 2.3 0.6 - 3.4   POC LYMPH PERCENT 22.4 10 - 50 %L   MID (cbc) 0.7 0 - 0.9   POC MID % 6.8 0 - 12 %M   POC Granulocyte 7.4 (A) 2 - 6.9   Granulocyte percent 70.8 37 - 80 %G   RBC 4.47 4.04 - 5.48 M/uL   Hemoglobin 13.8 12.2 - 16.2 g/dL   HCT, POC 39.6 37.7 - 47.9 %   MCV 88.6 80 - 97 fL   MCH, POC 30.8 27 - 31.2 pg   MCHC 34.8 31.8 - 35.4 g/dL   RDW, POC 12.5 %   Platelet Count, POC 307 142 - 424 K/uL   MPV 7.6 0 - 99.8 fL   Peak flow: 250, 250, 225. Poor effort.      Assessment & Plan:   1. Moderate persistent asthma with acute exacerbation   2. Acute seasonal allergic rhinitis due to pollen   3.  Acute recurrent maxillary sinusitis    -persistent; rx for Omnicef provided for two weeks; if symptoms persists, follow-up with ENT for CT sinuses. -rx for Prednisone provided.  Add Singulair.  -continue current other regimen for allergies and asthma. -recommend nasal saline spray daily to bid.   Orders Placed This Encounter  Procedures  . DG Chest 2 View    Standing Status:   Future    Number of Occurrences:   1    Standing Expiration Date:   07/06/2017    Order Specific Question:   Reason for Exam (SYMPTOM  OR DIAGNOSIS REQUIRED)    Answer:   coughing for two weeks; sinus congestion for three months.    Order Specific Question:   Is the patient pregnant?    Answer:   No    Comments:   s/p BTL    Order Specific Question:   Preferred imaging location?    Answer:   External  . POCT CBC   Meds ordered this encounter  Medications  . montelukast (SINGULAIR) 10 MG tablet    Sig: Take 1 tablet (10 mg total) by mouth at bedtime.    Dispense:  30 tablet    Refill:  3  . predniSONE (DELTASONE) 20 MG tablet    Sig: Take 3 PO QAM x 1 day, 2 PO QAM x 5 days, 1 PO QAM x 5 days    Dispense:  18 tablet    Refill:  0  . cefdinir (OMNICEF) 300 MG capsule    Sig: Take 2 capsules (600 mg total) by mouth daily.    Dispense:  30 capsule    Refill:  0    No Follow-up on file.   Nawal Burling Elayne Guerin, M.D. [facility] [address] [place], [place]  [id number] [phone] phone [phone] fax

## 2016-07-23 MED FILL — FLUoxetine HCL 20 MG CAPS: 20 | 90 days supply | Qty: 90 | Fill #0

## 2016-07-26 ENCOUNTER — Other Ambulatory Visit: Payer: Self-pay | Admitting: Nurse Practitioner

## 2016-07-26 MED ORDER — FLUCONAZOLE 200 MG PO TABS: 200.0000 mg | ORAL_TABLET | Freq: Every day | ORAL | 3 refills | Status: DC

## 2016-07-26 MED FILL — FLUCONAZOLE 200 MG TABLET: 200 | 10 days supply | Qty: 10 | Fill #0

## 2016-07-26 NOTE — Progress Notes (Signed)
Reorder of diflucan placed for patient

## 2016-08-15 MED FILL — VENTOLIN HFA 90 MCG INHALER: 108 (90 BAS | 25 days supply | Qty: 18 | Fill #2

## 2016-08-30 MED FILL — predniSONE 10 MG TABS: 10 | 12 days supply | Qty: 42 | Fill #0

## 2016-09-11 DIAGNOSIS — D2261 Melanocytic nevi of right upper limb, including shoulder: Secondary | ICD-10-CM | POA: Diagnosis not present

## 2016-09-11 DIAGNOSIS — L301 Dyshidrosis [pompholyx]: Secondary | ICD-10-CM | POA: Diagnosis not present

## 2016-09-11 DIAGNOSIS — D225 Melanocytic nevi of trunk: Secondary | ICD-10-CM | POA: Diagnosis not present

## 2016-09-11 DIAGNOSIS — D485 Neoplasm of uncertain behavior of skin: Secondary | ICD-10-CM | POA: Diagnosis not present

## 2016-09-17 MED FILL — MONTELUKAST SOD 10 MG TAB: 10 | 30 days supply | Qty: 30 | Fill #0

## 2016-10-18 MED FILL — MONTELUKAST SOD 10 MG TAB: 10 | 30 days supply | Qty: 30 | Fill #1

## 2016-11-08 DIAGNOSIS — R42 Dizziness and giddiness: Secondary | ICD-10-CM | POA: Diagnosis not present

## 2016-11-08 DIAGNOSIS — J320 Chronic maxillary sinusitis: Secondary | ICD-10-CM | POA: Diagnosis not present

## 2016-11-15 ENCOUNTER — Encounter: Payer: Self-pay | Admitting: Obstetrics and Gynecology

## 2016-11-15 ENCOUNTER — Ambulatory Visit (INDEPENDENT_AMBULATORY_CARE_PROVIDER_SITE_OTHER): Payer: 59 | Admitting: Obstetrics and Gynecology

## 2016-11-15 VITALS — BP 112/78 | HR 90 | Ht 69.0 in | Wt 165.0 lb

## 2016-11-15 DIAGNOSIS — B3731 Acute candidiasis of vulva and vagina: Secondary | ICD-10-CM

## 2016-11-15 DIAGNOSIS — B373 Candidiasis of vulva and vagina: Secondary | ICD-10-CM

## 2016-11-15 DIAGNOSIS — Z113 Encounter for screening for infections with a predominantly sexual mode of transmission: Secondary | ICD-10-CM | POA: Diagnosis not present

## 2016-11-15 NOTE — Progress Notes (Signed)
Chief Complaint  Patient presents with  . Gynecologic Exam    STD check    HPI:      Ms. Ebony Lewis is a 31 y.o. [id number] who LMP was Patient's last menstrual period was 10/27/2016 (exact date)., presents today for STD testing. She denies any known exposures or sx. She just wants to be "safe". She does feel like she has a yeast infection with increased d/c and irritation and took a diflucan this morning.  She has an annual sched for 6/18.    Patient Active Problem List   Diagnosis Date Noted  . Near syncope 03/23/2015  . Temporomandibular joint (TMJ) pain 01/09/2015  . Encounter to establish care 09/19/2014  . Arthralgia 09/19/2014  . Chronic infection of sinus 09/19/2014  . Asthma, moderate persistent 06/28/2014  . Seasonal allergies 06/28/2014  . History of seizures 05/06/2014  . Abdominal pain 08/05/2013  . Edema 08/05/2013  . Family history of ischemic heart disease 08/05/2013  . PALPITATIONS 08/01/2010    Family History  Problem Relation Age of Onset  . Heart murmur Father   . Hypertension Father   . Anxiety disorder Father   . Hyperlipidemia Father   . Coronary artery disease Maternal Grandmother   . Fibromyalgia Maternal Grandmother   . Cancer Maternal Grandmother     Melanoma, cervical, ovarian  . Cancer Mother 89    Lung Cancer  . Diabetes Paternal Grandfather   . Heart disease Paternal Grandfather   . Diabetes Maternal Grandfather   . Hypertension Maternal Grandfather   . Cancer Paternal Grandmother     Stage III lung cancer  . Multiple sclerosis Paternal Grandmother     Social History   Social History  . Marital status: Single    Spouse name: N/A  . Number of children: N/A  . Years of education: N/A   Occupational History  . Not on file.   Social History Main Topics  . Smoking status: Former Smoker    Quit date: 11/13/2011  . Smokeless tobacco: Never Used     Comment: Started age 78. Smokes 5-7 cigs weekly   . Alcohol use 0.6 oz/week    1 Glasses of wine per week     Comment: Socially   . Drug use: No  . Sexual activity: Yes    Partners: Male     Comment: Fiance   Other Topics Concern  . 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  albuterol (VENTOLIN HFA) 108 (90 Base) MCG/ACT inhaler, Inhale 2 puffs into the lungs as needed., Disp: 1 Inhaler, Rfl: 6 .  fluticasone (FLONASE) 50 MCG/ACT nasal spray, Place 2 sprays into both nostrils as needed for allergies or rhinitis., Disp: , Rfl:  .  montelukast (SINGULAIR) 10 MG tablet, Take 1 tablet (10 mg total) by mouth at bedtime., Disp: 30 tablet, Rfl: 3  Review of Systems   OBJECTIVE:   Vitals:  BP 112/78 (BP Location: Left Arm, Patient Position: Sitting, Cuff Size: Normal)   Pulse 90   Ht 5\' 9"  (1.753 m)   Wt 165 lb (74.8 kg)   LMP 10/27/2016 (Exact Date)   BMI 24.37 kg/m   Physical Exam  Constitutional: She is oriented to person, place, and time and well-developed, well-nourished, and in no distress.  Vital signs are normal.  Genitourinary: Uterus normal, cervix normal, left adnexa normal and vulva normal. Uterus is not enlarged. Cervix exhibits no motion tenderness and no tenderness. Right adnexum displays tenderness. Right adnexum displays no mass. Left adnexum displays no mass and no tenderness. Vulva exhibits no erythema, no exudate, no lesion, no rash and no tenderness. Vagina exhibits no lesion. Thick  white and vaginal discharge found.  Neurological: She is oriented to person, place, and time.  Vitals reviewed.   Assessment/Plan: Screening for STD (sexually transmitted disease) - Will notify pt of resutls. - Plan: HIV antibody, Hepatitis C antibody, HSV 2 antibody, IgG, RPR, Chlamydia/Gonococcus/Trichomonas, NAA  Candidal vaginitis - By Clinical sx. Pt already took diflucan this AM. F/u  prn.    Return if symptoms worsen or fail to improve.  Deontez Klinke B. Shaday Rayborn, PA-C 11/15/2016 10:26 AM

## 2016-11-16 LAB — HSV 2 ANTIBODY, IGG: HSV 2 Glycoprotein G Ab, IgG: <0.91 index (ref 0.00–0.90)

## 2016-11-16 LAB — HIV ANTIBODY (ROUTINE TESTING W REFLEX): HIV Screen 4th Generation wRfx: NONREACTIVE

## 2016-11-16 LAB — RPR: RPR Ser Ql: NONREACTIVE

## 2016-11-16 LAB — HEPATITIS C ANTIBODY: Hep C Virus Ab: <0.1 s/co ratio (ref 0.0–0.9)

## 2016-11-17 LAB — CHLAMYDIA/GONOCOCCUS/TRICHOMONAS, NAA
Chlamydia by NAA: NEGATIVE
Gonococcus by NAA: NEGATIVE
Trich vag by NAA: NEGATIVE

## 2016-11-19 MED FILL — AMOX-CLAV 875-125 MG TABLET: 875-125 | 14 days supply | Qty: 28 | Fill #0

## 2016-11-19 MED FILL — MONTELUKAST SOD 10 MG TAB: 10 | 30 days supply | Qty: 30 | Fill #0

## 2016-11-19 MED FILL — VENTOLIN HFA 90 MCG INHALER: 108 (90 BAS | 25 days supply | Qty: 18 | Fill #3

## 2016-11-26 ENCOUNTER — Ambulatory Visit (HOSPITAL_COMMUNITY)
Admission: EM | Admit: 2016-11-26 | Discharge: 2016-11-26 | Disposition: A | Discharge status: Home / Self Care | Payer: 59 | Attending: Internal Medicine | Admitting: Internal Medicine

## 2016-11-26 ENCOUNTER — Encounter (HOSPITAL_COMMUNITY): Payer: Self-pay | Admitting: Family Medicine

## 2016-11-26 DIAGNOSIS — H1033 Unspecified acute conjunctivitis, bilateral: Secondary | ICD-10-CM

## 2016-11-26 MED ORDER — EYE WASH OPHTH SOLN
OPHTHALMIC | Status: AC
  Filled 2016-11-26: qty 118

## 2016-11-26 MED ORDER — TETRACAINE HCL 0.5 % OP SOLN
OPHTHALMIC | Status: AC
Start: 2016-11-26 — End: 2016-11-26
  Filled 2016-11-26: qty 2

## 2016-11-26 MED ORDER — FLUCONAZOLE 200 MG PO TABS: ORAL_TABLET | ORAL | 0 refills | Status: DC

## 2016-11-26 MED ORDER — CIPROFLOXACIN HCL 0.3 % OP SOLN: OPHTHALMIC | 0 refills | Status: DC

## 2016-11-26 NOTE — ED Provider Notes (Signed)
CSN: [id number]     Arrival date & time 11/26/16  1147 History   First MD Initiated Contact with Patient 11/26/16 1258     Chief Complaint  Patient presents with  . Eye Problem   (Consider location/radiation/quality/duration/timing/severity/associated sxs/prior Treatment) 31 year old female presents to clinic with 2-3 week history of eye pain with worsening discharge.    The history is provided by the patient.  Eye Problem  Location:  Left eye Quality:  Aching and burning Severity:  Moderate Onset quality:  Gradual Duration:  2 weeks Timing:  Constant Progression:  Worsening Chronicity:  New Context: contact lenses   Context: not direct trauma, not foreign body and not scratch   Relieved by:  Nothing Worsened by:  Bright light and contact lenses Ineffective treatments:  None tried Associated symptoms: blurred vision, crusting, discharge, itching, redness and tearing   Associated symptoms: no double vision and no headaches     Past Medical History:  Diagnosis Date  . Allergy   . Anxiety    Pt states PTSD s/p post delivery hemorrhage.  . Asthma   . Blood transfusion without reported diagnosis   . Depression   . Heart murmur    Past Surgical History:  Procedure Laterality Date  . DILATION AND CURETTAGE OF UTERUS     EMERGENCY  . ENDOSCOPIC TURBINATE REDUCTION Bilateral 11/30/2014   Procedure: ENDOSCOPIC TURBINATE REDUCTION;  Surgeon: Carloyn Manner, MD;  Location: [place];  Service: ENT;  Laterality: Bilateral;  . FRONTAL SINUS EXPLORATION Left 11/30/2014   Procedure: FRONTAL SINUS EXPLORATION;  Surgeon: Carloyn Manner, MD;  Location: [place];  Service: ENT;  Laterality: Left;  . IMAGE GUIDED SINUS SURGERY N/A 11/30/2014   Procedure: IMAGE GUIDED SINUS SURGERY;  Surgeon: Carloyn Manner, MD;  Location: [place];  Service: ENT;  Laterality: N/A;  . NASAL SINUS SURGERY  11/2014  . SEPTOPLASTY WITH ETHMOIDECTOMY, AND MAXILLARY  ANTROSTOMY Bilateral 11/30/2014   Procedure: SEPTOPLASTY WITH ETHMOIDECTOMY, AND MAXILLARY ANTROSTOMY;  Surgeon: Carloyn Manner, MD;  Location: [place];  Service: ENT;  Laterality: Bilateral;  . SPHENOIDECTOMY Bilateral 11/30/2014   Procedure: SPHENOIDECTOMY;  Surgeon: Carloyn Manner, MD;  Location: [place];  Service: ENT;  Laterality: Bilateral;  . TUBAL LIGATION     Family History  Problem Relation Age of Onset  . Heart murmur Father   . Hypertension Father   . Anxiety disorder Father   . Hyperlipidemia Father   . Coronary artery disease Maternal Grandmother   . Fibromyalgia Maternal Grandmother   . Cancer Maternal Grandmother        Melanoma, cervical, ovarian  . Cancer Mother 53       Lung Cancer  . Diabetes Paternal Grandfather   . Heart disease Paternal Grandfather   . Diabetes Maternal Grandfather   . Hypertension Maternal Grandfather   . Cancer Paternal Grandmother        Stage III lung cancer  . Multiple sclerosis Paternal Grandmother    Social History  Substance Use Topics  . Smoking status: Former Smoker    Quit date: 11/13/2011  . Smokeless tobacco: Never Used     Comment: Started age 69. Smokes 5-7 cigs weekly   . Alcohol use 0.6 oz/week    1 Glasses of wine per week     Comment: Socially    OB History    Gravida Para Term Preterm AB Living   2 2 2     2    SAB TAB  Ectopic Multiple Live Births           2     Review of Systems  Constitutional: Negative.   HENT: Negative.   Eyes: Positive for blurred vision, discharge, redness and itching. Negative for double vision.  Respiratory: Negative.   Cardiovascular: Negative.   Gastrointestinal: Negative.   Musculoskeletal: Negative.   Skin: Negative.   Neurological: Negative for dizziness and headaches.    Allergies  Bactrim [sulfamethoxazole-trimethoprim] and Amoxicillin  Home Medications   Prior to Admission medications   Medication Sig Start Date End Date Taking? Authorizing  Provider  albuterol (VENTOLIN HFA) 108 (90 Base) MCG/ACT inhaler Inhale 2 puffs into the lungs as needed. 06/03/16   Nahser, [name], MD  ciprofloxacin (CILOXAN) 0.3 % ophthalmic solution Administer 2 drop, every 2 hours, while awake, for 2 days. Then 1 drop, every 4 hours, while awake, for the next 5 days. 11/26/16   Barnet Glasgow, NP  fluconazole (DIFLUCAN) 200 MG tablet Take one tablet today, wait 3 days, take the second tablet 11/26/16   Barnet Glasgow, NP  fluticasone [facility]) 50 MCG/ACT nasal spray Place 2 sprays into both nostrils as needed for allergies or rhinitis.    [provider]  montelukast (SINGULAIR) 10 MG tablet Take 1 tablet (10 mg total) by mouth at bedtime. 07/06/16   Wardell Honour, MD   Meds Ordered and Administered this Visit  Medications - No data to display  BP 112/74   Pulse 86   Temp 98.2 F (36.8 C)   Resp 18   LMP 10/27/2016 (Exact Date)   SpO2 100%  No data found.   Physical Exam  Constitutional: She is oriented to person, place, and time. She appears well-developed and well-nourished. No distress.  HENT:  Head: Normocephalic and atraumatic.  Right Ear: External ear normal.  Left Ear: External ear normal.  Eyes: Lids are normal. Pupils are equal, round, and reactive to light. Right eye exhibits discharge. Left eye exhibits discharge. Right conjunctiva is injected. Left conjunctiva is injected.  Fundoscopic exam:      The right eye shows no hemorrhage. The right eye shows red reflex.       The left eye shows no hemorrhage. The left eye shows red reflex.  Neck: Normal range of motion.  Cardiovascular: Normal rate and regular rhythm.   Pulmonary/Chest: Effort normal and breath sounds normal.  Lymphadenopathy:    She has no cervical adenopathy.  Neurological: She is alert and oriented to person, place, and time.  Skin: Skin is warm and dry. Capillary refill takes less than 2 seconds. She is not diaphoretic.  Psychiatric: She has a  normal mood and affect. Her behavior is normal.  Nursing note and vitals reviewed.   Urgent Care Course     Procedures (including critical care time)  Labs Review Labs Reviewed - No data to display  Imaging Review No results found.   Visual Acuity Review  Right Eye Distance:   Left Eye Distance:   Bilateral Distance: 20/200 (pt not wearing contacts due to eye issues)  Right Eye Near:   Left Eye Near:    Bilateral Near:         MDM   1. Acute conjunctivitis of both eyes, unspecified acute conjunctivitis type    Conjunctivitis, treated with cipro eye drops, follow up with PCP or ophthalmology if symptoms persist, go to ER if they worsen.  Fluconazole provided at pts request to treat for yeast following prolonged steroid use.  Barnet Glasgow, NP 11/26/16 1347

## 2016-11-26 NOTE — ED Triage Notes (Signed)
Pt here for redness and drainage to right eye for a few days.

## 2016-11-26 NOTE — Discharge Instructions (Signed)
For your eyes, I have prescribed cipro eye drops, place 2 drops in each eye every 2 hours for 2 days, then 2 drops every 4-8 hours for the next 5 days after that.  I have also provided a prescription for diflucan, take 1 tablet now, wait three days, then take a second tablet.

## 2016-11-26 NOTE — ED Notes (Signed)
Eye box placed in room

## 2016-12-18 ENCOUNTER — Ambulatory Visit: Payer: 59 | Admitting: Obstetrics and Gynecology

## 2016-12-23 MED FILL — MONTELUKAST SOD 10 MG TAB: 10 | 30 days supply | Qty: 30 | Fill #1

## 2017-01-12 DIAGNOSIS — Z1371 Encounter for nonprocreative screening for genetic disease carrier status: Secondary | ICD-10-CM

## 2017-01-12 DIAGNOSIS — Z1379 Encounter for other screening for genetic and chromosomal anomalies: Secondary | ICD-10-CM

## 2017-01-12 DIAGNOSIS — Z9189 Other specified personal risk factors, not elsewhere classified: Secondary | ICD-10-CM

## 2017-01-12 HISTORY — DX: Encounter for other screening for genetic and chromosomal anomalies: Z13.79

## 2017-01-12 HISTORY — DX: Encounter for nonprocreative screening for genetic disease carrier status: Z13.71

## 2017-01-12 HISTORY — DX: Other specified personal risk factors, not elsewhere classified: Z91.89

## 2017-01-20 ENCOUNTER — Emergency Department
Admission: EM | Admit: 2017-01-20 | Discharge: 2017-01-21 | Disposition: A | Discharge status: Home / Self Care | Payer: 59 | Attending: Emergency Medicine | Admitting: Emergency Medicine

## 2017-01-20 ENCOUNTER — Encounter: Payer: Self-pay | Admitting: Emergency Medicine

## 2017-01-20 DIAGNOSIS — R404 Transient alteration of awareness: Secondary | ICD-10-CM | POA: Diagnosis not present

## 2017-01-20 DIAGNOSIS — R531 Weakness: Secondary | ICD-10-CM | POA: Insufficient documentation

## 2017-01-20 DIAGNOSIS — J45909 Unspecified asthma, uncomplicated: Secondary | ICD-10-CM | POA: Insufficient documentation

## 2017-01-20 DIAGNOSIS — R55 Syncope and collapse: Secondary | ICD-10-CM | POA: Insufficient documentation

## 2017-01-20 DIAGNOSIS — Z87891 Personal history of nicotine dependence: Secondary | ICD-10-CM | POA: Insufficient documentation

## 2017-01-20 LAB — CBC
HCT: 40.4 % (ref 35.0–47.0)
Hemoglobin: 13.6 g/dL (ref 12.0–16.0)
MCH: 30.5 pg (ref 26.0–34.0)
MCHC: 33.7 g/dL (ref 32.0–36.0)
MCV: 90.7 fL (ref 80.0–100.0)
Platelets: 305 10*3/uL (ref 150–440)
RBC: 4.45 MIL/uL (ref 3.80–5.20)
RDW: 13 % (ref 11.5–14.5)
WBC: 10.9 10*3/uL (ref 3.6–11.0)

## 2017-01-20 LAB — BASIC METABOLIC PANEL
Anion gap: 5 (ref 5–15)
BUN: 9 mg/dL (ref 6–20)
CO2: 26 mmol/L (ref 22–32)
Calcium: 8.8 mg/dL — ABNORMAL LOW (ref 8.9–10.3)
Chloride: 109 mmol/L (ref 101–111)
Creatinine, Ser: 0.7 mg/dL (ref 0.44–1.00)
GFR calc Af Amer: >60 mL/min (ref >60)
GFR calc non Af Amer: >60 mL/min (ref >60)
Glucose, Bld: 90 mg/dL (ref 65–99)
Potassium: 3.6 mmol/L (ref 3.5–5.1)
Sodium: 140 mmol/L (ref 135–145)

## 2017-01-20 LAB — POCT PREGNANCY, URINE: Preg Test, Ur: NEGATIVE

## 2017-01-20 LAB — URINALYSIS, COMPLETE (UACMP) WITH MICROSCOPIC
Bacteria, UA: NONE SEEN
Bilirubin Urine: NEGATIVE
Glucose, UA: NEGATIVE mg/dL
Hgb urine dipstick: NEGATIVE
Ketones, ur: NEGATIVE mg/dL
Leukocytes, UA: NEGATIVE
Nitrite: NEGATIVE
Protein, ur: NEGATIVE mg/dL
RBC / HPF: NONE SEEN RBC/hpf (ref 0–5)
Specific Gravity, Urine: 1.024 (ref 1.005–1.030)
WBC, UA: NONE SEEN WBC/hpf (ref 0–5)
pH: 7 (ref 5.0–8.0)

## 2017-01-20 LAB — GLUCOSE, CAPILLARY
Glucose-Capillary: 98 mg/dL (ref 65–99)
Glucose-Capillary: 83 mg/dL (ref 65–99)

## 2017-01-20 LAB — HCG, QUANTITATIVE, PREGNANCY: hCG, Beta Chain, Quant, S: <1 m[IU]/mL (ref <5)

## 2017-01-20 MED ORDER — SODIUM CHLORIDE 0.9 % IV BOLUS (SEPSIS)
1000.0000 mL | Freq: Once | INTRAVENOUS | Status: AC
  Administered 2017-01-21: 1000 mL via INTRAVENOUS

## 2017-01-20 NOTE — ED Notes (Addendum)
Patient reports when she turns head too quickly she feels warm, has tingling from head that radiates down her body, and experiences episodes of near syncope

## 2017-01-20 NOTE — ED Notes (Addendum)
Coming by [facility] for multiple syncopal episodes. Initial fsbs 66 got oral glucose, symptoms x 2 weeks, feels better after she eats. No hx of diabetes. IV 20 g right forearm per EMS, 126/80, HR 86 sats 99%, no co pain.

## 2017-01-20 NOTE — ED Notes (Signed)
ED Provider at bedside. 

## 2017-01-20 NOTE — ED Notes (Signed)
Pt screaming in the lobby in between talking on the phone and with other people, pt stated" I feel like I am going to pass out" . Pt stood up unplugged phone from wall and walked to wheelchair, V/S taken , VSS, charge notified

## 2017-01-20 NOTE — ED Triage Notes (Signed)
Pt to triage in wheelchair, pt brought in by EMS from home with c/o syncopal episodes throughout today with a CBG on arrival of 60, CBG in triage 89. 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

## 2017-01-20 NOTE — ED Notes (Signed)
Pt c/o intermittent headache and blurred vision X 2 weeks. Pt c/o multiple near syncopal episodes today.

## 2017-01-20 NOTE — ED Notes (Signed)
Pt texting for approx 10 minutes, places her phone on her lap, and starts yelling "shes going to end up on the floor and if something happens to her while here, its going to be a big lawsuit".  Pt picks up phone and starts texting again

## 2017-01-20 NOTE — ED Notes (Signed)
Pt texting during the entire triage process.

## 2017-01-21 ENCOUNTER — Emergency Department: Payer: 59

## 2017-01-21 ENCOUNTER — Encounter: Payer: Self-pay | Admitting: Radiology

## 2017-01-21 DIAGNOSIS — R55 Syncope and collapse: Secondary | ICD-10-CM | POA: Diagnosis not present

## 2017-01-21 DIAGNOSIS — Z87891 Personal history of nicotine dependence: Secondary | ICD-10-CM | POA: Diagnosis not present

## 2017-01-21 DIAGNOSIS — R42 Dizziness and giddiness: Secondary | ICD-10-CM | POA: Diagnosis not present

## 2017-01-21 DIAGNOSIS — J45909 Unspecified asthma, uncomplicated: Secondary | ICD-10-CM | POA: Diagnosis not present

## 2017-01-21 DIAGNOSIS — R531 Weakness: Secondary | ICD-10-CM | POA: Diagnosis not present

## 2017-01-21 LAB — URINE DRUG SCREEN, QUALITATIVE (ARMC ONLY)
Amphetamines, Ur Screen: NOT DETECTED
Barbiturates, Ur Screen: NOT DETECTED
Benzodiazepine, Ur Scrn: NOT DETECTED
Cannabinoid 50 Ng, Ur ~~LOC~~: NOT DETECTED
Cocaine Metabolite,Ur ~~LOC~~: NOT DETECTED
MDMA (Ecstasy)Ur Screen: NOT DETECTED
Methadone Scn, Ur: NOT DETECTED
Opiate, Ur Screen: NOT DETECTED
Phencyclidine (PCP) Ur S: NOT DETECTED
Tricyclic, Ur Screen: NOT DETECTED

## 2017-01-21 LAB — TROPONIN I: Troponin I: <0.03 ng/mL (ref <0.03)

## 2017-01-21 MED ORDER — SODIUM CHLORIDE 0.9 % IV BOLUS (SEPSIS)
1000.0000 mL | Freq: Once | INTRAVENOUS | Status: AC
  Administered 2017-01-21: 1000 mL via INTRAVENOUS

## 2017-01-21 MED ORDER — IOPAMIDOL (ISOVUE-370) INJECTION 76%
100.0000 mL | Freq: Once | INTRAVENOUS | Status: AC | PRN
  Administered 2017-01-21: 100 mL via INTRAVENOUS

## 2017-01-21 NOTE — ED Notes (Signed)
Pt getting dressed at this time. States [facility] is on the way. "I just want to get out of here". IV removed, d/c instructions reviewed.

## 2017-01-21 NOTE — ED Provider Notes (Signed)
[facility] Emergency Department Provider Note   ____________________________________________   First MD Initiated Contact with Patient 01/20/17 2340     (approximate)  I have reviewed the triage vital signs and the nursing notes.   HISTORY  Chief Complaint Near Syncope    HPI Ebony Lewis is a 31 y.o. female who comes into the hospital today after almost blacking out multiple times. She reports that today she was sitting in a chair and laying in bed and felt like she was going to pass out. 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 She states that she has felt bad all day. She called EMS when she felt as though she was going to black out and her blood sugar was 60. The patient reports that she has been home eating all day so she doesn't understand why she feels this way. She reports that whether she is moving or not sitting still or not she is having these episodes where she feels that she is going to pass out. The patient states that she is not going home until she figures out what's going on. She reports that she feels like she did when she had a postpartum hemorrhage and her hemoglobin was 3. The patient had dizziness approximately one month ago and saw her in nose and throat doctor. She reports that she's also been having blurred vision and black spots for the past 2 weeks. She denies any chest pain but has had occasional headaches with pain going down her neck and her back. She denies any shortness of breath but has had some hot flashes with the episodes. She states her headaches has been going on and off for the past 2 weeks but denies any pain at this time. The patient's mother died with brain cancer in her 46s and the patient has been having difficulty with her memory. She reports that this is all the sudden and very scary so she is here today for evaluation.   Past Medical History:  Diagnosis Date  . Allergy   . Anxiety    Pt  states PTSD s/p post delivery hemorrhage.  . Asthma   . Blood transfusion without reported diagnosis   . Depression   . Heart murmur     Patient Active Problem List   Diagnosis Date Noted  . Near syncope 03/23/2015  . Temporomandibular joint (TMJ) pain 01/09/2015  . Encounter to establish care 09/19/2014  . Arthralgia 09/19/2014  . Chronic infection of sinus 09/19/2014  . Asthma, moderate persistent 06/28/2014  . Seasonal allergies 06/28/2014  . History of seizures 05/06/2014  . Abdominal pain 08/05/2013  . Edema 08/05/2013  . Family history of ischemic heart disease 08/05/2013  . PALPITATIONS 08/01/2010    Past Surgical History:  Procedure Laterality Date  . DILATION AND CURETTAGE OF UTERUS     EMERGENCY  . ENDOSCOPIC TURBINATE REDUCTION Bilateral 11/30/2014   Procedure: ENDOSCOPIC TURBINATE REDUCTION;  Surgeon: Carloyn Manner, MD;  Location: [place];  Service: ENT;  Laterality: Bilateral;  . FRONTAL SINUS EXPLORATION Left 11/30/2014   Procedure: FRONTAL SINUS EXPLORATION;  Surgeon: Carloyn Manner, MD;  Location: [place];  Service: ENT;  Laterality: Left;  . IMAGE GUIDED SINUS SURGERY N/A 11/30/2014   Procedure: IMAGE GUIDED SINUS SURGERY;  Surgeon: Carloyn Manner, MD;  Location: [place];  Service: ENT;  Laterality: N/A;  . NASAL SINUS SURGERY  11/2014  . SEPTOPLASTY WITH ETHMOIDECTOMY, AND MAXILLARY ANTROSTOMY  Bilateral 11/30/2014   Procedure: SEPTOPLASTY WITH ETHMOIDECTOMY, AND MAXILLARY ANTROSTOMY;  Surgeon: Carloyn Manner, MD;  Location: [place];  Service: ENT;  Laterality: Bilateral;  . SPHENOIDECTOMY Bilateral 11/30/2014   Procedure: SPHENOIDECTOMY;  Surgeon: Carloyn Manner, MD;  Location: [place];  Service: ENT;  Laterality: Bilateral;  . TUBAL LIGATION      Prior to Admission medications   Medication Sig Start Date End Date Taking? Authorizing Provider  albuterol (VENTOLIN HFA) 108 (90 Base) MCG/ACT  inhaler Inhale 2 puffs into the lungs as needed. 06/03/16   Nahser, [name], MD  ciprofloxacin (CILOXAN) 0.3 % ophthalmic solution Administer 2 drop, every 2 hours, while awake, for 2 days. Then 1 drop, every 4 hours, while awake, for the next 5 days. 11/26/16   Barnet Glasgow, NP  fluconazole (DIFLUCAN) 200 MG tablet Take one tablet today, wait 3 days, take the second tablet 11/26/16   Barnet Glasgow, NP  fluticasone [facility]) 50 MCG/ACT nasal spray Place 2 sprays into both nostrils as needed for allergies or rhinitis.    [provider]  montelukast (SINGULAIR) 10 MG tablet Take 1 tablet (10 mg total) by mouth at bedtime. 07/06/16   Wardell Honour, MD    Allergies Bactrim [sulfamethoxazole-trimethoprim] and Amoxicillin  Family History  Problem Relation Age of Onset  . Heart murmur Father   . Hypertension Father   . Anxiety disorder Father   . Hyperlipidemia Father   . Coronary artery disease Maternal Grandmother   . Fibromyalgia Maternal Grandmother   . Cancer Maternal Grandmother        Melanoma, cervical, ovarian  . Cancer Mother 4       Lung Cancer  . Diabetes Paternal Grandfather   . Heart disease Paternal Grandfather   . Diabetes Maternal Grandfather   . Hypertension Maternal Grandfather   . Cancer Paternal Grandmother        Stage III lung cancer  . Multiple sclerosis Paternal Grandmother     Social History Social History  Substance Use Topics  . Smoking status: Former Smoker    Quit date: 11/13/2011  . Smokeless tobacco: Never Used     Comment: Started age 38. Smokes 5-7 cigs weekly   . Alcohol use 0.6 oz/week    1 Glasses of wine per week     Comment: Socially     Review of Systems  Constitutional: Chills and hot flashes Eyes: No visual changes. ENT: No sore throat. Cardiovascular: Denies chest pain. Respiratory: Denies shortness of breath. Gastrointestinal: No abdominal pain.  No nausea, no vomiting.  No diarrhea.  No  constipation. Genitourinary: Negative for dysuria. Musculoskeletal: Negative for back pain. Skin: Negative for rash. Neurological: Pre-syncope, headaches, dizziness   ____________________________________________   PHYSICAL EXAM:  VITAL SIGNS: ED Triage Vitals [01/20/17 2003]  Enc Vitals Group     BP 121/76     Pulse Rate 75     Resp 16     Temp 98 F (36.7 C)     Temp Source Oral     SpO2 99 %     Weight 165 lb (74.8 kg)     Height      Head Circumference      Peak Flow      Pain Score      Pain Loc      Pain Edu?      Excl. in [place]?     Constitutional: Alert and oriented. Well appearing and in Mild to moderate distress. Eyes: Conjunctivae  are normal. PERRL. EOMI. Head: Atraumatic. Nose: No congestion/rhinnorhea. Mouth/Throat: Mucous membranes are moist.  Oropharynx non-erythematous. Neck: No stridor.  Cardiovascular: Normal rate, regular rhythm. Grossly normal heart sounds.  Good peripheral circulation. Respiratory: Normal respiratory effort.  No retractions. Lungs CTAB. Gastrointestinal: Soft and nontender. No distention. Positive bowel sounds Musculoskeletal: No lower extremity tenderness nor edema.   Neurologic:  Normal speech and language. Cranial nerves II through XII are grossly intact with no focal motor or neuro deficits. Patient states she has general weakness but her strength is 4-5 out of 5 bilaterally, the patient has tingling diffusely and not unilaterally. She has good finger to nose and no pronator drift. Skin:  Skin is warm, dry and intact.  Psychiatric: Mood and affect are normal.   ____________________________________________   LABS (all labs ordered are listed, but only abnormal results are displayed)  Labs Reviewed  BASIC METABOLIC PANEL - Abnormal; Notable for the following:       Result Value   Calcium 8.8 (*)    All other components within normal limits  URINALYSIS, COMPLETE (UACMP) WITH MICROSCOPIC - Abnormal; Notable for the following:     Color, Urine YELLOW (*)    APPearance CLEAR (*)    Squamous Epithelial / LPF 0-5 (*)    All other components within normal limits  CBC  GLUCOSE, CAPILLARY  TROPONIN I  HCG, QUANTITATIVE, PREGNANCY  GLUCOSE, CAPILLARY  URINE DRUG SCREEN, QUALITATIVE ([facility] ONLY)  CBG MONITORING, ED  POC URINE PREG, ED  POCT PREGNANCY, URINE   ____________________________________________  EKG  ED ECG REPORT I, Loney Hering, the attending physician, personally viewed and interpreted this ECG.   Date: 01/30/2017  EKG Time: 2017  Rate: 85  Rhythm: normal sinus rhythm  Axis: normal  Intervals:none  ST&T Change: none  ____________________________________________  RADIOLOGY  Ct Angio Head W Or Wo Contrast  Result Date: 01/21/2017 CLINICAL DATA:  Initial evaluation for acute dizziness, presyncope. EXAM: CT ANGIOGRAPHY HEAD AND NECK TECHNIQUE: Multidetector CT imaging of the head and neck was performed using the standard protocol during bolus administration of intravenous contrast. Multiplanar CT image reconstructions and MIPs were obtained to evaluate the vascular anatomy. Carotid stenosis measurements (when applicable) are obtained utilizing NASCET criteria, using the distal internal carotid diameter as the denominator. CONTRAST:  100 cc of Isovue 370. COMPARISON:  Prior head CT from 10/02/2014. FINDINGS: CT HEAD FINDINGS Brain: Cerebral volume within normal limits for patient age. No evidence for acute intracranial hemorrhage. No findings to suggest acute large vessel territory infarct. No mass lesion, midline shift, or mass effect. Ventricles are normal in size without evidence for hydrocephalus. No extra-axial fluid collection identified. Vascular: No hyperdense vessel identified. Skull: Scalp soft tissues demonstrate no acute abnormality.Calvarium intact. Sinuses/Orbits: Globes and orbital soft tissues are within normal limits. Visualized paranasal sinuses are clear. No mastoid effusion. CTA  NECK FINDINGS Aortic arch: Visualized aortic arch of normal caliber with normal branch pattern. No flow-limiting stenosis about the origin of the great vessels. Visualized subclavian artery is widely patent. Right carotid system: Right common and internal carotid artery's widely patent without stenosis, dissection, or occlusion. No atheromatous narrowing about the right carotid bifurcation. Left carotid system: Left common and internal carotid artery's are widely patent without stenosis, dissection, or occlusion. No atheromatous narrowing about the left carotid bifurcation. Vertebral arteries: Both of the vertebral arteries arise from the subclavian arteries. Evaluation the proximal left vertebral artery mildly limited by streak artifact from adjacent venous contrast. Vertebral arteries widely  patent within the neck without stenosis, dissection, or occlusion. Skeleton: Reversal of the normal cervical lordosis. No acute osseous abnormality. No worrisome lytic or blastic osseous lesions. Other neck: Soft tissues of the neck demonstrate no acute abnormality. Salivary glands normal. No adenopathy. Thyroid normal. Upper chest: Visualized upper chest within normal limits. Partially visualized lungs are clear. Review of the MIP images confirms the above findings CTA HEAD FINDINGS Anterior circulation: Petrous, cavernous, and supraclinoid segments widely patent without flow-limiting stenosis. ICA termini widely patent. Left A1 segment dominant and widely patent. Right A1 segment hypoplastic but patent as well. Anterior communicating artery normal. Anterior cerebral arteries widely patent to their distal aspects. M1 segments widely patent without stenosis or occlusion. No proximal M2 occlusion. Distal MCA branches widely patent and well opacified bilaterally. Posterior circulation: Vertebral arteries widely patent to the vertebrobasilar junction. Left vertebral artery dominant. Posterior inferior cerebral arteries patent  bilaterally. Basilar artery widely patent. Superior cerebral arteries patent proximally. Left PCA supplied via the basilar and is widely patent to its distal aspect. Predominant fetal type right PCA supplied via a a AE right posterior communicating artery. Right PCA also patent to its distal aspect. Venous sinuses: Patent. Anatomic variants: No significant anatomic variant. No aneurysm or vascular malformation. Delayed phase: No pathologic enhancement. Review of the MIP images confirms the above findings IMPRESSION: Normal CTA of the head and neck. No acute intracranial process identified. Electronically Signed   By: Jeannine Boga M.D.   On: 01/21/2017 01:26   Ct Angio Neck W And/or Wo Contrast  Result Date: 01/21/2017 CLINICAL DATA:  Initial evaluation for acute dizziness, presyncope. EXAM: CT ANGIOGRAPHY HEAD AND NECK TECHNIQUE: Multidetector CT imaging of the head and neck was performed using the standard protocol during bolus administration of intravenous contrast. Multiplanar CT image reconstructions and MIPs were obtained to evaluate the vascular anatomy. Carotid stenosis measurements (when applicable) are obtained utilizing NASCET criteria, using the distal internal carotid diameter as the denominator. CONTRAST:  100 cc of Isovue 370. COMPARISON:  Prior head CT from 10/02/2014. FINDINGS: CT HEAD FINDINGS Brain: Cerebral volume within normal limits for patient age. No evidence for acute intracranial hemorrhage. No findings to suggest acute large vessel territory infarct. No mass lesion, midline shift, or mass effect. Ventricles are normal in size without evidence for hydrocephalus. No extra-axial fluid collection identified. Vascular: No hyperdense vessel identified. Skull: Scalp soft tissues demonstrate no acute abnormality.Calvarium intact. Sinuses/Orbits: Globes and orbital soft tissues are within normal limits. Visualized paranasal sinuses are clear. No mastoid effusion. CTA NECK FINDINGS Aortic  arch: Visualized aortic arch of normal caliber with normal branch pattern. No flow-limiting stenosis about the origin of the great vessels. Visualized subclavian artery is widely patent. Right carotid system: Right common and internal carotid artery's widely patent without stenosis, dissection, or occlusion. No atheromatous narrowing about the right carotid bifurcation. Left carotid system: Left common and internal carotid artery's are widely patent without stenosis, dissection, or occlusion. No atheromatous narrowing about the left carotid bifurcation. Vertebral arteries: Both of the vertebral arteries arise from the subclavian arteries. Evaluation the proximal left vertebral artery mildly limited by streak artifact from adjacent venous contrast. Vertebral arteries widely patent within the neck without stenosis, dissection, or occlusion. Skeleton: Reversal of the normal cervical lordosis. No acute osseous abnormality. No worrisome lytic or blastic osseous lesions. Other neck: Soft tissues of the neck demonstrate no acute abnormality. Salivary glands normal. No adenopathy. Thyroid normal. Upper chest: Visualized upper chest within normal limits. Partially visualized lungs  are clear. Review of the MIP images confirms the above findings CTA HEAD FINDINGS Anterior circulation: Petrous, cavernous, and supraclinoid segments widely patent without flow-limiting stenosis. ICA termini widely patent. Left A1 segment dominant and widely patent. Right A1 segment hypoplastic but patent as well. Anterior communicating artery normal. Anterior cerebral arteries widely patent to their distal aspects. M1 segments widely patent without stenosis or occlusion. No proximal M2 occlusion. Distal MCA branches widely patent and well opacified bilaterally. Posterior circulation: Vertebral arteries widely patent to the vertebrobasilar junction. Left vertebral artery dominant. Posterior inferior cerebral arteries patent bilaterally. Basilar  artery widely patent. Superior cerebral arteries patent proximally. Left PCA supplied via the basilar and is widely patent to its distal aspect. Predominant fetal type right PCA supplied via a a AE right posterior communicating artery. Right PCA also patent to its distal aspect. Venous sinuses: Patent. Anatomic variants: No significant anatomic variant. No aneurysm or vascular malformation. Delayed phase: No pathologic enhancement. Review of the MIP images confirms the above findings IMPRESSION: Normal CTA of the head and neck. No acute intracranial process identified. Electronically Signed   By: Jeannine Boga M.D.   On: 01/21/2017 01:26    ____________________________________________   PROCEDURES  Procedure(s) performed: None  Procedures  Critical Care performed: No  ____________________________________________   INITIAL IMPRESSION / ASSESSMENT AND PLAN / ED COURSE  Pertinent labs & imaging results that were available during my care of the patient were reviewed by me and considered in my medical decision making (see chart for details).  This is a 31 year old female who comes into the hospital today with some presyncope symptoms. The patient is stating that she has these episodes where she feels as though she is given a pass out they last for probably about 20-30 seconds and then they go away. The patient did have an episode while he was examining her but she had no EKG changes she had no vital sign abnormalities. I did send the patient for a CTA of her head and neck with a concern for possible vertebrobasilar insufficiency or cerebral pathology causing these symptoms. The patient's CT angio of her head and neck are unremarkable. I did give the patient liter of normal saline and I took some blood work which was all unremarkable. I will discuss with the patient her need for further evaluation but she did come out into her room saying that she was having continued symptoms. I will give the  patient another liter of normal saline and then reassess the patient.      The patient's CT scans were unremarkable. She reports that the presyncope feeling she still does have occasionally when she leans over or turns her head. She reports also when she touches her right sinus that she does feel as though she might pass out. She reports that she's been having some chronic sinusitis problems and saw Dr. Pryor Ochoa one month ago with dizziness. She reports that she thinks that may be causing some of her symptoms. I discussed with the patient that we had done the studies and she did receive 2 L of normal saline. I discussed that the next best thing would be for her to follow up with cardiology as well as with ENT. 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 Pryor Ochoa office in the morning. The patient reports that she feels comfortable going home and will follow-up in the  morning. The patient reports that she has no further questions at this time. Her vital signs remain unremarkable and her blood work again is unremarkable. The patient will be discharged home to follow-up tomorrow. ____________________________________________   FINAL CLINICAL IMPRESSION(S) / ED DIAGNOSES  Final diagnoses:  Near syncope  Weakness      NEW MEDICATIONS STARTED DURING THIS VISIT:  New Prescriptions   No medications on file     Note:  This document was prepared using Dragon voice recognition software and may include unintentional dictation errors.    Loney Hering, MD 01/21/17 [phone]

## 2017-01-21 NOTE — ED Notes (Signed)
Pt placed into uber by this RN, walking with steady gait without assistance, talking on facetime on phone with friend

## 2017-01-21 NOTE — Discharge Instructions (Signed)
Please follow up with her ENT physician as well as cardiology as we discussed. Please continue to drink fluids and eat appropriately. Please return with any other concerns or any worsening condition.

## 2017-01-21 NOTE — ED Notes (Signed)
Pt provided gingerale and graham crackers per request/MD order. Pt on phone at this time, states attempting to find ride.

## 2017-01-21 NOTE — ED Notes (Signed)
Pt. Verbalizes understanding of d/c instructions and follow-up. VS stable and pt denies pain.  Pt. In NAD at time of d/c and denies further concerns regarding this visit. Pt. Stable at the time of departure from the unit, departing unit by the safest and most appropriate manner per that pt condition and limitations. Pt advised to return to the ED at any time for emergent concerns, or for new/worsening symptoms.   

## 2017-01-22 DIAGNOSIS — R7309 Other abnormal glucose: Secondary | ICD-10-CM | POA: Diagnosis not present

## 2017-01-29 ENCOUNTER — Telehealth: Payer: Self-pay

## 2017-01-29 NOTE — Telephone Encounter (Signed)
Called to see if patient could come in today for sooner appt with np chris berge

## 2017-01-31 MED FILL — MONTELUKAST SOD 10 MG TAB: 10 | 30 days supply | Qty: 30 | Fill #2

## 2017-02-03 ENCOUNTER — Ambulatory Visit (INDEPENDENT_AMBULATORY_CARE_PROVIDER_SITE_OTHER): Payer: 59 | Admitting: Obstetrics and Gynecology

## 2017-02-03 ENCOUNTER — Encounter: Payer: Self-pay | Admitting: Obstetrics and Gynecology

## 2017-02-03 VITALS — BP 120/80 | HR 78 | Ht 69.0 in | Wt 160.0 lb

## 2017-02-03 DIAGNOSIS — Z0000 Encounter for general adult medical examination without abnormal findings: Secondary | ICD-10-CM

## 2017-02-03 DIAGNOSIS — Z1151 Encounter for screening for human papillomavirus (HPV): Secondary | ICD-10-CM

## 2017-02-03 DIAGNOSIS — Z124 Encounter for screening for malignant neoplasm of cervix: Secondary | ICD-10-CM

## 2017-02-03 DIAGNOSIS — Z803 Family history of malignant neoplasm of breast: Secondary | ICD-10-CM | POA: Diagnosis not present

## 2017-02-03 DIAGNOSIS — Z8742 Personal history of other diseases of the female genital tract: Secondary | ICD-10-CM

## 2017-02-03 DIAGNOSIS — Z8041 Family history of malignant neoplasm of ovary: Secondary | ICD-10-CM | POA: Diagnosis not present

## 2017-02-03 DIAGNOSIS — Z113 Encounter for screening for infections with a predominantly sexual mode of transmission: Secondary | ICD-10-CM

## 2017-02-03 DIAGNOSIS — Z01419 Encounter for gynecological examination (general) (routine) without abnormal findings: Secondary | ICD-10-CM

## 2017-02-03 DIAGNOSIS — R87612 Low grade squamous intraepithelial lesion on cytologic smear of cervix (LGSIL): Secondary | ICD-10-CM | POA: Insufficient documentation

## 2017-02-03 DIAGNOSIS — Z1321 Encounter for screening for nutritional disorder: Secondary | ICD-10-CM | POA: Diagnosis not present

## 2017-02-03 DIAGNOSIS — Z87898 Personal history of other specified conditions: Secondary | ICD-10-CM | POA: Diagnosis not present

## 2017-02-03 DIAGNOSIS — Z801 Family history of malignant neoplasm of trachea, bronchus and lung: Secondary | ICD-10-CM | POA: Diagnosis not present

## 2017-02-03 DIAGNOSIS — Z8049 Family history of malignant neoplasm of other genital organs: Secondary | ICD-10-CM | POA: Diagnosis not present

## 2017-02-03 NOTE — Progress Notes (Signed)
Chief Complaint  Patient presents with  . Gynecologic Exam    std check     HPI:      Ms. Ebony Lewis is a 31 y.o. [id number] who LMP was Patient's last menstrual period was 01/20/2017., presents today for her annual examination.  Her menses are regular every 28-30 days, lasting 7 days, heavy flow with clots after BTL, Had normal CBC 7/18.  Dysmenorrhea none. She does not have intermenstrual bleeding.  Sex activity: single partner, contraception - tubal ligation. Pt wants STD testing. No sx. Just wants to be safe. Last Pap: April 14, 2015  Results were: no abnormalities /neg HPV DNA  Hx of STDs: HPV; LGSIL 2015  There is a probable FH of breast cancer in her mom (diagnosed as stage 4 lung cancer, probable breast mets) and mat grt aunt. There is a FH of ovarian cancer in her MGM. The patient does do self-breast exams. Her unaffected mat great aunt tested positive for a breast cancer genetic mutation, but pt unsure which one. She does not take Vit D supp.  Tobacco use: The patient denies current or previous tobacco use. Alcohol use: social drinker Exercise: moderately active  She does get adequate calcium in her diet.    Past Medical History:  Diagnosis Date  . Allergy   . Anxiety    Pt states PTSD s/p post delivery hemorrhage.  . Asthma   . Blood transfusion without reported diagnosis   . Depression   . Heart murmur     Past Surgical History:  Procedure Laterality Date  . DILATION AND CURETTAGE OF UTERUS     EMERGENCY  . ENDOSCOPIC TURBINATE REDUCTION Bilateral 11/30/2014   Procedure: ENDOSCOPIC TURBINATE REDUCTION;  Surgeon: Carloyn Manner, MD;  Location: [place];  Service: ENT;  Laterality: Bilateral;  . FRONTAL SINUS EXPLORATION Left 11/30/2014   Procedure: FRONTAL SINUS EXPLORATION;  Surgeon: Carloyn Manner, MD;  Location: [facility];  Service: ENT;  Laterality: Left;  . IMAGE GUIDED SINUS SURGERY N/A 11/30/2014   Procedure: IMAGE GUIDED  SINUS SURGERY;  Surgeon: Carloyn Manner, MD;  Location: [place];  Service: ENT;  Laterality: N/A;  . NASAL SINUS SURGERY  11/2014  . SEPTOPLASTY WITH ETHMOIDECTOMY, AND MAXILLARY ANTROSTOMY Bilateral 11/30/2014   Procedure: SEPTOPLASTY WITH ETHMOIDECTOMY, AND MAXILLARY ANTROSTOMY;  Surgeon: Carloyn Manner, MD;  Location: [place];  Service: ENT;  Laterality: Bilateral;  . SPHENOIDECTOMY Bilateral 11/30/2014   Procedure: SPHENOIDECTOMY;  Surgeon: Carloyn Manner, MD;  Location: [place];  Service: ENT;  Laterality: Bilateral;  . TUBAL LIGATION      Family History  Problem Relation Age of Onset  . Heart murmur Father   . Hypertension Father   . Anxiety disorder Father   . Hyperlipidemia Father   . Coronary artery disease Maternal Grandmother   . Fibromyalgia Maternal Grandmother   . Cancer Maternal Grandmother 22       Melanoma, cervical, ovarian  . Cancer Mother 19       Lung Cancer--question mets from breast  . Diabetes Paternal Grandfather   . Heart disease Paternal Grandfather   . Diabetes Maternal Grandfather   . Hypertension Maternal Grandfather   . Cancer Paternal Grandmother        Stage III lung cancer  . Multiple sclerosis Paternal Grandmother   . Breast cancer Other 49  . Cancer Other        blood; tested positive for breast cancer genetic mutation--? type  Social History   Social History  . Marital status: Single    Spouse name: N/A  . Number of children: N/A  . Years of education: N/A   Occupational History  . Not on file.   Social History Main Topics  . Smoking status: Former Smoker    Quit date: 11/13/2011  . Smokeless tobacco: Never Used     Comment: Started age 35. Smokes 5-7 cigs weekly   . Alcohol use 0.6 oz/week    1 Glasses of wine per week     Comment: Socially   . Drug use: No  . Sexual activity: Yes    Partners: Male    Birth control/ protection: Surgical     Comment: Fiance   Other Topics Concern  .  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  albuterol (VENTOLIN HFA) 108 (90 Base) MCG/ACT inhaler, Inhale 2 puffs into the lungs as needed., Disp: 1 Inhaler, Rfl: 6 .  amoxicillin-clavulanate (AUGMENTIN) 875-125 MG tablet, Take 1 tablet by mouth 2 (two) times daily. for 10 days, Disp: , Rfl: 0 .  ciprofloxacin (CILOXAN) 0.3 % ophthalmic solution, Administer 2 drop, every 2 hours, while awake, for 2 days. Then 1 drop, every 4 hours, while awake, for the next 5 days., Disp: 5 mL, Rfl: 0 .  fluconazole (DIFLUCAN) 200 MG tablet, Take one tablet today, wait 3 days, take the second tablet, Disp: 2 tablet, Rfl: 0 .  fluticasone (FLONASE) 50 MCG/ACT nasal spray, Place 2 sprays into both nostrils as needed for allergies or rhinitis., Disp: , Rfl:  .  montelukast (SINGULAIR) 10 MG tablet, Take 1 tablet (10 mg total) by mouth at bedtime., Disp: 30 tablet, Rfl: 3 .  predniSONE (DELTASONE) 10 MG tablet, TAKE 6 TAB 2 DAYS, 5 TAB FOR 2 DAYS, 4 TAB FOR 2 DAYS, 3 TAB X 2 DAYS,1 TAB FOR 2 DAYS, Disp: , Rfl: 0  ROS:  Review of Systems  Constitutional: Positive for fatigue. Negative for fever and unexpected weight change.  Respiratory: Negative for cough, shortness of breath and wheezing.   Cardiovascular: Negative for chest pain, palpitations and leg swelling.  Gastrointestinal: Negative for blood in stool, constipation, diarrhea, nausea and vomiting.  Endocrine: Negative for cold intolerance, heat intolerance and polyuria.  Genitourinary: Negative for dyspareunia, dysuria, flank pain, frequency, genital sores, hematuria, menstrual problem, pelvic pain, urgency, vaginal bleeding, vaginal discharge and vaginal pain.  Musculoskeletal: Positive for arthralgias. Negative for back pain, joint swelling and myalgias.  Skin: Negative for rash.    Neurological: Positive for headaches. Negative for dizziness, syncope, light-headedness and numbness.  Hematological: Negative for adenopathy.  Psychiatric/Behavioral: Negative for agitation, confusion, sleep disturbance and suicidal ideas. The patient is not nervous/anxious.      Objective: BP 120/80   Pulse 78   Ht 5\' 9"  (1.753 m)   Wt 160 lb (72.6 kg)   LMP 01/20/2017   BMI 23.63 kg/m    Physical Exam  Constitutional: She is oriented to person, place, and time. She appears well-developed and well-nourished.  Genitourinary: Vagina normal and uterus normal. There is no rash or tenderness on the right labia. There is no rash or tenderness on the left labia. No erythema or tenderness in the vagina. No vaginal discharge found. Right adnexum does not display  mass and does not display tenderness. Left adnexum does not display mass and does not display tenderness. Cervix does not exhibit motion tenderness or polyp. Uterus is not enlarged or tender.  Neck: Normal range of motion. No thyromegaly present.  Cardiovascular: Normal rate, regular rhythm and normal heart sounds.   No murmur heard. Pulmonary/Chest: Effort normal and breath sounds normal. Right breast exhibits no mass, no nipple discharge, no skin change and no tenderness. Left breast exhibits no mass, no nipple discharge, no skin change and no tenderness.  Abdominal: Soft. There is no tenderness. There is no guarding.  Musculoskeletal: Normal range of motion.  Neurological: She is alert and oriented to person, place, and time. No cranial nerve deficit.  Psychiatric: She has a normal mood and affect. Her behavior is normal.  Vitals reviewed.    Assessment/Plan: Encounter for annual routine gynecological examination  Cervical cancer screening - Plan: IGP,CtNgTv,Apt HPV,rfx16/18,45  Screening for STD (sexually transmitted disease) - Plan: IGP,CtNgTv,Apt HPV,rfx16/18,45  Screening for HPV (human papillomavirus) - Plan:  IGP,CtNgTv,Apt HPV,rfx16/18,45  History of abnormal cervical Pap smear  Family history of ovarian cancer - MyRisk testing discussed and done today. Handout given. RTO in 6 wks for results.  - Plan: Integrated [facility]  Blood tests for routine general physical examination - Plan: VITAMIN D 25 Hydroxy (Vit-D Deficiency, Fractures)  Encounter for vitamin deficiency screening - Plan: VITAMIN D 25 Hydroxy (Vit-D Deficiency, Fractures)             GYN counsel breast self exam, mammography screening, adequate intake of calcium and vitamin D     F/U  Return in about 6 weeks (around 03/17/2017) for [facility] results.Elmo Putt B. Tamsin Nader, PA-C 02/03/2017 11:34 AM

## 2017-02-04 LAB — VITAMIN D 25 HYDROXY (VIT D DEFICIENCY, FRACTURES): Vit D, 25-Hydroxy: 71.4 ng/mL (ref 30.0–100.0)

## 2017-02-05 ENCOUNTER — Other Ambulatory Visit: Payer: Self-pay | Admitting: Otolaryngology

## 2017-02-05 DIAGNOSIS — R42 Dizziness and giddiness: Secondary | ICD-10-CM | POA: Diagnosis not present

## 2017-02-05 DIAGNOSIS — R519 Headache, unspecified: Secondary | ICD-10-CM

## 2017-02-05 DIAGNOSIS — R51 Headache: Principal | ICD-10-CM

## 2017-02-05 DIAGNOSIS — H539 Unspecified visual disturbance: Secondary | ICD-10-CM

## 2017-02-05 DIAGNOSIS — J320 Chronic maxillary sinusitis: Secondary | ICD-10-CM | POA: Diagnosis not present

## 2017-02-05 DIAGNOSIS — J329 Chronic sinusitis, unspecified: Secondary | ICD-10-CM

## 2017-02-06 LAB — IGP,CTNGTV,APT HPV,RFX16/18,45
Chlamydia, Nuc. Acid Amp: NEGATIVE
Gonococcus, Nuc. Acid Amp: NEGATIVE
HPV Aptima: NEGATIVE
PAP Smear Comment: 0
Trich vag by NAA: NEGATIVE

## 2017-02-10 ENCOUNTER — Ambulatory Visit
Admission: RE | Admit: 2017-02-10 | Discharge: 2017-02-10 | Disposition: A | Discharge status: Home / Self Care | Payer: 59 | Source: Ambulatory Visit | Attending: Otolaryngology | Admitting: Otolaryngology

## 2017-02-10 DIAGNOSIS — R519 Headache, unspecified: Secondary | ICD-10-CM

## 2017-02-10 DIAGNOSIS — H539 Unspecified visual disturbance: Secondary | ICD-10-CM | POA: Diagnosis not present

## 2017-02-10 DIAGNOSIS — R51 Headache: Secondary | ICD-10-CM | POA: Insufficient documentation

## 2017-02-10 DIAGNOSIS — J329 Chronic sinusitis, unspecified: Secondary | ICD-10-CM | POA: Diagnosis not present

## 2017-02-10 DIAGNOSIS — R42 Dizziness and giddiness: Secondary | ICD-10-CM | POA: Insufficient documentation

## 2017-02-10 MED ORDER — GADOBENATE DIMEGLUMINE 529 MG/ML IV SOLN
15.0000 mL | Freq: Once | INTRAVENOUS | Status: AC | PRN
  Administered 2017-02-10: 15 mL via INTRAVENOUS

## 2017-02-12 ENCOUNTER — Encounter: Payer: Self-pay | Admitting: Obstetrics and Gynecology

## 2017-02-17 ENCOUNTER — Telehealth: Payer: Self-pay | Admitting: Obstetrics and Gynecology

## 2017-02-17 NOTE — Telephone Encounter (Signed)
Called and left voicemail for patient to call back to be schedule with Ardeth Perfect for My Risk Results

## 2017-02-17 NOTE — Telephone Encounter (Signed)
Pt will call back to schedule once she knows her availability

## 2017-02-25 ENCOUNTER — Telehealth: Payer: 59 | Admitting: Family

## 2017-02-25 DIAGNOSIS — J019 Acute sinusitis, unspecified: Secondary | ICD-10-CM

## 2017-02-25 DIAGNOSIS — B9689 Other specified bacterial agents as the cause of diseases classified elsewhere: Secondary | ICD-10-CM

## 2017-02-25 MED ORDER — LEVOFLOXACIN 500 MG PO TABS: 500.0000 mg | ORAL_TABLET | Freq: Every day | ORAL | 0 refills | Status: DC

## 2017-02-25 MED FILL — levoFLOXacin 500 MG TABS: 500 | 10 days supply | Qty: 10 | Fill #0

## 2017-02-25 NOTE — Progress Notes (Signed)
We are sorry that you are not feeling well.  Here is how we plan to help!  Based on what you have shared with me it looks like you have sinusitis.  Sinusitis is inflammation and infection in the sinus cavities of the head.  Based on your presentation I believe you most likely have Acute Bacterial Sinusitis.  This is an infection caused by bacteria and is treated with antibiotics. I have prescribed Levaquin 500 mg once daily x 10 days. You may use an oral decongestant such as Mucinex D or if you have glaucoma or high blood pressure use plain Mucinex. Saline nasal spray help and can safely be used as often as needed for congestion.  If you develop worsening sinus pain, fever or notice severe headache and vision changes, or if symptoms are not better after completion of antibiotic, please schedule an appointment with a health care provider.    Sinus infections are not as easily transmitted as other respiratory infection, however we still recommend that you avoid close contact with loved ones, especially the very young and elderly.  Remember to wash your hands thoroughly throughout the day as this is the number one way to prevent the spread of infection!  Home Care:  Only take medications as instructed by your medical team.  Complete the entire course of an antibiotic.  Do not take these medications with alcohol.  A steam or ultrasonic humidifier can help congestion.  You can place a towel over your head and breathe in the steam from hot water coming from a faucet.  Avoid close contacts especially the very young and the elderly.  Cover your mouth when you cough or sneeze.  Always remember to wash your hands.  Get Help Right Away If:  You develop worsening fever or sinus pain.  You develop a severe head ache or visual changes.  Your symptoms persist after you have completed your treatment plan.  Make sure you  Understand these instructions.  Will watch your condition.  Will get help  right away if you are not doing well or get worse.  Your e-visit answers were reviewed by a board certified advanced clinical practitioner to complete your personal care plan.  Depending on the condition, your plan could have included both over the counter or prescription medications.  If there is a problem please reply  once you have received a response from your provider.  Your safety is important to [place].  If you have drug allergies check your prescription carefully.    You can use MyChart to ask questions about today's visit, request a non-urgent call back, or ask for a work or school excuse for 24 hours related to this e-Visit. If it has been greater than 24 hours you will need to follow up with your provider, or enter a new e-Visit to address those concerns.  You will get an e-mail in the next two days asking about your experience.  I hope that your e-visit has been valuable and will speed your recovery. Thank you for using e-visits.

## 2017-02-26 DIAGNOSIS — R55 Syncope and collapse: Secondary | ICD-10-CM | POA: Diagnosis not present

## 2017-02-26 DIAGNOSIS — R413 Other amnesia: Secondary | ICD-10-CM | POA: Diagnosis not present

## 2017-02-26 DIAGNOSIS — E538 Deficiency of other specified B group vitamins: Secondary | ICD-10-CM | POA: Diagnosis not present

## 2017-02-26 DIAGNOSIS — R42 Dizziness and giddiness: Secondary | ICD-10-CM | POA: Diagnosis not present

## 2017-03-06 ENCOUNTER — Ambulatory Visit: Payer: 59 | Admitting: Obstetrics and Gynecology

## 2017-03-10 ENCOUNTER — Other Ambulatory Visit: Payer: 59 | Admitting: *Deleted

## 2017-03-10 ENCOUNTER — Telehealth: Payer: Self-pay | Admitting: Obstetrics and Gynecology

## 2017-03-10 DIAGNOSIS — R413 Other amnesia: Secondary | ICD-10-CM | POA: Diagnosis not present

## 2017-03-10 DIAGNOSIS — R42 Dizziness and giddiness: Secondary | ICD-10-CM | POA: Diagnosis not present

## 2017-03-10 NOTE — Addendum Note (Signed)
Addended by: Eulis Foster on: 03/10/2017 10:26 AM   Modules accepted: Orders

## 2017-03-10 NOTE — Telephone Encounter (Signed)
-----   Message from Chad Cordial, PA-C sent at 03/06/2017  4:08 PM EDT ----- Regarding: MyRisk appt Pt N/S for [facility] appt today. Can you pls call her tomorrow (Fri) to reschedule it. Thx.

## 2017-03-10 NOTE — Telephone Encounter (Signed)
Called and left voicemail for patient to call back to be reschedule °

## 2017-03-11 LAB — SPECIMEN STATUS

## 2017-03-12 NOTE — Telephone Encounter (Signed)
Called and left voicemail for patient to call back to be reschedule °

## 2017-03-13 ENCOUNTER — Encounter (HOSPITAL_COMMUNITY): Payer: Self-pay

## 2017-03-13 ENCOUNTER — Emergency Department (HOSPITAL_COMMUNITY): Payer: 59

## 2017-03-13 ENCOUNTER — Emergency Department (HOSPITAL_COMMUNITY)
Admission: EM | Admit: 2017-03-13 | Discharge: 2017-03-14 | Disposition: A | Discharge status: Home / Self Care | Payer: 59 | Attending: Physician Assistant | Admitting: Physician Assistant

## 2017-03-13 DIAGNOSIS — R51 Headache: Secondary | ICD-10-CM | POA: Diagnosis not present

## 2017-03-13 DIAGNOSIS — Z87891 Personal history of nicotine dependence: Secondary | ICD-10-CM | POA: Diagnosis not present

## 2017-03-13 DIAGNOSIS — J45909 Unspecified asthma, uncomplicated: Secondary | ICD-10-CM | POA: Insufficient documentation

## 2017-03-13 DIAGNOSIS — Z79899 Other long term (current) drug therapy: Secondary | ICD-10-CM | POA: Diagnosis not present

## 2017-03-13 DIAGNOSIS — M542 Cervicalgia: Secondary | ICD-10-CM | POA: Diagnosis not present

## 2017-03-13 DIAGNOSIS — R55 Syncope and collapse: Secondary | ICD-10-CM | POA: Diagnosis not present

## 2017-03-13 DIAGNOSIS — I6789 Other cerebrovascular disease: Secondary | ICD-10-CM | POA: Diagnosis not present

## 2017-03-13 DIAGNOSIS — R200 Anesthesia of skin: Secondary | ICD-10-CM | POA: Diagnosis not present

## 2017-03-13 MED ORDER — LORAZEPAM 1 MG PO TABS
1.0000 mg | ORAL_TABLET | Freq: Once | ORAL | Status: AC
  Administered 2017-03-13: 1 mg via ORAL
  Filled 2017-03-13: qty 1

## 2017-03-13 NOTE — ED Notes (Addendum)
Patient transported to CT 

## 2017-03-13 NOTE — Discharge Instructions (Signed)
Please read attached information. If you experience any new or worsening signs or symptoms please return to the emergency room for evaluation. Please follow-up with your primary care provider or specialist as discussed.  °

## 2017-03-13 NOTE — ED Triage Notes (Signed)
[facility]_ pt states she was driving and heard a loud pop on her neck. She reports she immediately started to black out. Pt states she felt numbness and tingling in her arms. Pt has c-collar in place. 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 Pt aoX4.

## 2017-03-13 NOTE — ED Notes (Addendum)
Pt in CT when called from lobby. CT will room pt following scan.

## 2017-03-13 NOTE — ED Notes (Signed)
Pt asked about wait time. Pt informed of name being placed in room, and that somebody would be out to get her shortly.

## 2017-03-13 NOTE — ED Provider Notes (Signed)
[facility] DEPT Provider Note   CSN: [id number] Arrival date & time: 03/13/17  1419     History   Chief Complaint Chief Complaint  Patient presents with  . Neck Pain    HPI Ebony Lewis is a 31 y.o. female.  HPI  31 year old female presents today with complaints of neck pain.  Patient notes a history of the same.  She notes slow onset of symptoms including episodes of dizziness, seeing black spots in her vision, and some memory issues.  She notes several months ago she had an episode of near syncope and severe posterior neck pain.  She notes that after moving her head back she felt like she was going to pass out.  She was seen in the emergency room where she had a CT angios neck, and CT angios head.  The showed no acute findings.  Patient followed up as an outpatient with neurology who performed an MRI brain which showed no acute abnormalities.  Patient continues to have symptomatic episodes, and has a follow-up appointment with neurosurgery in 1 week.  Patient reports that while driving today she felt a crunching sound in her posterior neck and then the sensation that she was going to pass out.  She reports that everything became dark as her vision was fading, she felt numbness in her head.  Patient denies any other symptoms presently.  She also notes she had radiation of pain down her right arm with no loss of sensation strength or motor function.  Patient reports the symptoms are no different than previous and have not worsened.  She denies any dizziness.    Past Medical History:  Diagnosis Date  . Allergy   . Anxiety    Pt states PTSD s/p post delivery hemorrhage.  . Asthma   . Blood transfusion without reported diagnosis   . BRCA negative 01/2017  . Depression   . Family history of breast cancer   . Genetic testing of female 01/2017   MyRisk neg; IBIS=22.9%/riskscore=34.6%  . Heart murmur   . Increased risk of breast cancer 01/2017   IBIS=22.9%/riskscore=34.6%     Patient Active Problem List   Diagnosis Date Noted  . LGSIL on Pap smear of cervix 02/03/2017  . Near syncope 03/23/2015  . Temporomandibular joint (TMJ) pain 01/09/2015  . Encounter to establish care 09/19/2014  . Arthralgia 09/19/2014  . Chronic infection of sinus 09/19/2014  . Asthma, moderate persistent 06/28/2014  . Seasonal allergies 06/28/2014  . History of seizures 05/06/2014  . Abdominal pain 08/05/2013  . Edema 08/05/2013  . Family history of ischemic heart disease 08/05/2013  . PALPITATIONS 08/01/2010    Past Surgical History:  Procedure Laterality Date  . DILATION AND CURETTAGE OF UTERUS     EMERGENCY  . ENDOSCOPIC TURBINATE REDUCTION Bilateral 11/30/2014   Procedure: ENDOSCOPIC TURBINATE REDUCTION;  Surgeon: Carloyn Manner, MD;  Location: [facility];  Service: ENT;  Laterality: Bilateral;  . FRONTAL SINUS EXPLORATION Left 11/30/2014   Procedure: FRONTAL SINUS EXPLORATION;  Surgeon: Carloyn Manner, MD;  Location: [facility];  Service: ENT;  Laterality: Left;  . IMAGE GUIDED SINUS SURGERY N/A 11/30/2014   Procedure: IMAGE GUIDED SINUS SURGERY;  Surgeon: Carloyn Manner, MD;  Location: [facility];  Service: ENT;  Laterality: N/A;  . NASAL SINUS SURGERY  11/2014  . SEPTOPLASTY WITH ETHMOIDECTOMY, AND MAXILLARY ANTROSTOMY Bilateral 11/30/2014   Procedure: SEPTOPLASTY WITH ETHMOIDECTOMY, AND MAXILLARY ANTROSTOMY;  Surgeon: Carloyn Manner, MD;  Location: [facility];  Service:  ENT;  Laterality: Bilateral;  . SPHENOIDECTOMY Bilateral 11/30/2014   Procedure: SPHENOIDECTOMY;  Surgeon: Carloyn Manner, MD;  Location: [facility];  Service: ENT;  Laterality: Bilateral;  . TUBAL LIGATION      OB History    Gravida Para Term Preterm AB Living   '2 2 2     2   ' SAB TAB Ectopic Multiple Live Births           2       Home Medications    Prior to Admission medications   Medication Sig Start Date End Date Taking? Authorizing  Provider  albuterol (VENTOLIN HFA) 108 (90 Base) MCG/ACT inhaler Inhale 2 puffs into the lungs as needed. 06/03/16   Nahser, [name], MD  amoxicillin-clavulanate (AUGMENTIN) 875-125 MG tablet Take 1 tablet by mouth 2 (two) times daily. for 10 days 01/21/17   [provider]  ciprofloxacin (CILOXAN) 0.3 % ophthalmic solution Administer 2 drop, every 2 hours, while awake, for 2 days. Then 1 drop, every 4 hours, while awake, for the next 5 days. 11/26/16   Barnet Glasgow, NP  fluconazole (DIFLUCAN) 200 MG tablet Take one tablet today, wait 3 days, take the second tablet 11/26/16   Barnet Glasgow, NP  fluticasone [facility]) 50 MCG/ACT nasal spray Place 2 sprays into both nostrils as needed for allergies or rhinitis.    [provider]  levofloxacin (LEVAQUIN) 500 MG tablet Take 1 tablet (500 mg total) by mouth daily. 02/25/17   Kennyth Arnold, FNP  montelukast (SINGULAIR) 10 MG tablet Take 1 tablet (10 mg total) by mouth at bedtime. 07/06/16   Wardell Honour, MD  predniSONE (DELTASONE) 10 MG tablet TAKE 6 TAB 2 DAYS, 5 TAB FOR 2 DAYS, 4 TAB FOR 2 DAYS, 3 TAB X 2 DAYS,1 TAB FOR 2 DAYS 01/21/17   [provider]    Family History Family History  Problem Relation Age of Onset  . Heart murmur Father   . Hypertension Father   . Anxiety disorder Father   . Hyperlipidemia Father   . Coronary artery disease Maternal Grandmother   . Fibromyalgia Maternal Grandmother   . Cancer Maternal Grandmother 22       Melanoma, cervical, ovarian  . Cancer Mother 55       Lung Cancer--question mets from breast  . Diabetes Paternal Grandfather   . Heart disease Paternal Grandfather   . Diabetes Maternal Grandfather   . Hypertension Maternal Grandfather   . Cancer Paternal Grandmother        Stage III lung cancer  . Multiple sclerosis Paternal Grandmother   . Breast cancer Other 49  . Cancer Other        blood; tested positive for breast cancer genetic mutation--? type    Social  History Social History  Substance Use Topics  . Smoking status: Former Smoker    Quit date: 11/13/2011  . Smokeless tobacco: Never Used     Comment: Started age 55. Smokes 5-7 cigs weekly   . Alcohol use 0.6 oz/week    1 Glasses of wine per week     Comment: Socially      Allergies   Bactrim [sulfamethoxazole-trimethoprim] and Amoxicillin   Review of Systems Review of Systems  All other systems reviewed and are negative.    Physical Exam Updated Vital Signs BP 133/77 (BP Location: Right Arm)   Pulse 91   Temp 98.5 F (36.9 C) (Oral)   Resp 16   LMP 02/10/2017  SpO2 99%   Physical Exam  Constitutional: She is oriented to person, place, and time. She appears well-developed and well-nourished.  HENT:  Head: Normocephalic and atraumatic.  Eyes: Pupils are equal, round, and reactive to light. Conjunctivae are normal. Right eye exhibits no discharge. Left eye exhibits no discharge. No scleral icterus.  Neck: Normal range of motion. No JVD present. No tracheal deviation present.  Pulmonary/Chest: Effort normal. No stridor.  Musculoskeletal:  Exquisite tenderness to the posterior cervical and upper thoracic regions  Neurological: She is alert and oriented to person, place, and time. She has normal strength. No cranial nerve deficit or sensory deficit. Coordination normal. GCS eye subscore is 4. GCS verbal subscore is 5. GCS motor subscore is 6.  Psychiatric: She has a normal mood and affect. Her behavior is normal. Judgment and thought content normal.  Nursing note and vitals reviewed.    ED Treatments / Results  Labs (all labs ordered are listed, but only abnormal results are displayed) Labs Reviewed - No data to display  EKG  EKG Interpretation None       Radiology Ct Head Wo Contrast  Result Date: 03/13/2017 CLINICAL DATA:  Acute onset neck pain while driving. EXAM: CT HEAD WITHOUT CONTRAST CT CERVICAL SPINE WITHOUT CONTRAST TECHNIQUE: Multidetector CT imaging  of the head and cervical spine was performed following the standard protocol without intravenous contrast. Multiplanar CT image reconstructions of the cervical spine were also generated. COMPARISON:  MRI brain dated February 10, 2017. CT of the head and neck dated January 21, 2017. FINDINGS: CT HEAD FINDINGS Brain: No evidence of acute infarction, hemorrhage, hydrocephalus, extra-axial collection or mass lesion/mass effect. Vascular: No hyperdense vessel or unexpected calcification. Skull: Normal. Negative for fracture or focal lesion. Sinuses/Orbits: Small mucous retention cyst in the right sphenoid sinus. The remaining paranasal sinuses and mastoid air cells are clear. The orbits are unremarkable. Other: None. CT CERVICAL SPINE FINDINGS Alignment: Slight reversal of the normal cervical lordosis. Otherwise normal. Skull base and vertebrae: No acute fracture. No primary bone lesion or focal pathologic process. Soft tissues and spinal canal: No prevertebral fluid or swelling. No visible canal hematoma. Disc levels: Small central disc protrusions from C3-C4 through C6-C7. No high-grade spinal canal or neuroforaminal stenosis. Upper chest: Negative. Other: None. IMPRESSION: 1. Normal noncontrast head CT. 2.  No acute cervical spine fracture. 3. Minimal degenerative disc disease of the cervical spine. Electronically Signed   By: Titus Dubin M.D.   On: 03/13/2017 16:49   Ct Cervical Spine Wo Contrast  Result Date: 03/13/2017 CLINICAL DATA:  Acute onset neck pain while driving. EXAM: CT HEAD WITHOUT CONTRAST CT CERVICAL SPINE WITHOUT CONTRAST TECHNIQUE: Multidetector CT imaging of the head and cervical spine was performed following the standard protocol without intravenous contrast. Multiplanar CT image reconstructions of the cervical spine were also generated. COMPARISON:  MRI brain dated February 10, 2017. CT of the head and neck dated January 21, 2017. FINDINGS: CT HEAD FINDINGS Brain: No evidence of acute infarction,  hemorrhage, hydrocephalus, extra-axial collection or mass lesion/mass effect. Vascular: No hyperdense vessel or unexpected calcification. Skull: Normal. Negative for fracture or focal lesion. Sinuses/Orbits: Small mucous retention cyst in the right sphenoid sinus. The remaining paranasal sinuses and mastoid air cells are clear. The orbits are unremarkable. Other: None. CT CERVICAL SPINE FINDINGS Alignment: Slight reversal of the normal cervical lordosis. Otherwise normal. Skull base and vertebrae: No acute fracture. No primary bone lesion or focal pathologic process. Soft tissues and spinal canal: No prevertebral  fluid or swelling. No visible canal hematoma. Disc levels: Small central disc protrusions from C3-C4 through C6-C7. No high-grade spinal canal or neuroforaminal stenosis. Upper chest: Negative. Other: None. IMPRESSION: 1. Normal noncontrast head CT. 2.  No acute cervical spine fracture. 3. Minimal degenerative disc disease of the cervical spine. Electronically Signed   By: Titus Dubin M.D.   On: 03/13/2017 16:49    Procedures Procedures (including critical care time)  Medications Ordered in ED Medications  LORazepam (ATIVAN) tablet 1 mg (1 mg Oral Given 03/13/17 2042)     Initial Impression / Assessment and Plan / ED Course  I have reviewed the triage vital signs and the nursing notes.  Pertinent labs & imaging results that were available during my care of the patient were reviewed by me and considered in my medical decision making (see chart for details).      Final Clinical Impressions(s) / ED Diagnoses   Final diagnoses:  Neck pain    31 year old female presents today with numerous complaints.  Patient has had several months of neurologic complaints.  She has had significant workup including CT angios, CT cervical, CT head, CT angios head, MRI brain without any significant findings.  Patient has a follow-up appointment with neurosurgery.  She is requesting MRI here.  MRI will  be performed at this time.  Numerous times at obtaining MRI were unsuccessful.  Patient was given Ativan.  Throughout today's exam patient is extremely anxious.  She notes that is her anxiety increases the symptoms worsen.  Patient's family is at bedside and agree that her anxiety is concerning here today.  Patient has had significant workup in the past for life threatening etiology.  This MRI performed here in the ED was to assist with ongoing evaluation of her complaints.  Patient given 2 mg of Ativan.  She will have one more attempt at MRI here if she is unsuccessful with this I feel she is safe for outpatient follow-up as previously scheduled.  I am uncertain as to patient's source of symptoms, I do not feel that hospital admission would provide any further information for her.  I advised her to avoid driving until she has definitive evaluation.  Patient care will be signed out to oncoming provider pending MRI.  New Prescriptions New Prescriptions   No medications on file     Francee Gentile 03/13/17 2129    Macarthur Critchley, MD 03/13/17 [phone]

## 2017-03-13 NOTE — ED Notes (Signed)
Pt in CT.

## 2017-03-14 ENCOUNTER — Telehealth: Payer: Self-pay | Admitting: Cardiovascular Disease

## 2017-03-14 ENCOUNTER — Emergency Department (HOSPITAL_COMMUNITY): Payer: 59

## 2017-03-14 DIAGNOSIS — R202 Paresthesia of skin: Secondary | ICD-10-CM | POA: Diagnosis not present

## 2017-03-14 DIAGNOSIS — Z87891 Personal history of nicotine dependence: Secondary | ICD-10-CM | POA: Diagnosis not present

## 2017-03-14 DIAGNOSIS — R55 Syncope and collapse: Secondary | ICD-10-CM | POA: Diagnosis not present

## 2017-03-14 DIAGNOSIS — J45909 Unspecified asthma, uncomplicated: Secondary | ICD-10-CM | POA: Diagnosis not present

## 2017-03-14 DIAGNOSIS — Z79899 Other long term (current) drug therapy: Secondary | ICD-10-CM | POA: Diagnosis not present

## 2017-03-14 DIAGNOSIS — M542 Cervicalgia: Secondary | ICD-10-CM | POA: Diagnosis not present

## 2017-03-14 MED ORDER — ALPRAZOLAM 0.25 MG PO TABS
0.2500 mg | ORAL_TABLET | Freq: Three times a day (TID) | ORAL | 0 refills | Status: DC | PRN
Start: 2017-03-14 — End: 2022-04-30

## 2017-03-14 MED FILL — MONTELUKAST SOD 10 MG TAB: 10 | 30 days supply | Qty: 30 | Fill #3

## 2017-03-14 MED FILL — ALPRAZolam 0.25 MG TABS: 0.25 | 7 days supply | Qty: 20 | Fill #0

## 2017-03-14 NOTE — ED Notes (Signed)
Patient returned from MRI.

## 2017-03-14 NOTE — ED Provider Notes (Signed)
Results for orders placed or performed in visit on 03/10/17  Antinuclear Antib (ANA)  Result Value Ref Range   Anit Nuclear Antibody(ANA) Negative Negative  IFE, PE and FLC, Serum  Result Value Ref Range   IgG (Immunoglobin G), Serum 1,150 700 - 1,600 mg/dL   IgA/Immunoglobulin A, Serum 191 87 - 352 mg/dL   IgM (Immunoglobulin M), Srm 118 26 - 217 mg/dL   Total Protein 7.0 6.0 - 8.5 g/dL   Albumin SerPl Elph-Mcnc 4.0 2.9 - 4.4 g/dL   Alpha 1 0.2 0.0 - 0.4 g/dL   Alpha2 Glob SerPl Elph-Mcnc 0.6 0.4 - 1.0 g/dL   B-Globulin SerPl Elph-Mcnc 0.9 0.7 - 1.3 g/dL   Gamma Glob SerPl Elph-Mcnc 1.3 0.4 - 1.8 g/dL   M Protein SerPl Elph-Mcnc Not Observed Not Observed g/dL   Globulin, Total 3.0 2.2 - 3.9 g/dL   Albumin/Glob SerPl 1.4 0.7 - 1.7   IFE 1 Comment    Please Note Comment    Ig Kappa Free Light Chain 15.0 3.3 - 19.4 mg/L   Ig Lambda Free Light Chain 20.9 5.7 - 26.3 mg/L   Kappa/Lambda FluidC Ratio 0.72 0.26 - 1.65  Rheumatoid Factor  Result Value Ref Range   Rhuematoid fact SerPl-aCnc <10.0 0.0 - 13.9 IU/mL  Specimen Status  Result Value Ref Range   WBC WILL FOLLOW    RBC WILL FOLLOW    Hemoglobin WILL FOLLOW    Hematocrit WILL FOLLOW    MCV WILL FOLLOW    MCH WILL FOLLOW    MCHC WILL FOLLOW    RDW WILL FOLLOW    Platelets WILL FOLLOW    Neutrophils WILL FOLLOW    Lymphs WILL FOLLOW    Monocytes WILL FOLLOW    Eos WILL FOLLOW    Basos WILL FOLLOW    Neutrophils Absolute WILL FOLLOW    Lymphocytes Absolute WILL FOLLOW    Monocytes Absolute WILL FOLLOW    EOS (ABSOLUTE) WILL FOLLOW    Basophils Absolute WILL FOLLOW    Immature Granulocytes WILL FOLLOW    Immature Grans (Abs) WILL FOLLOW    Ct Head Wo Contrast  Result Date: 03/13/2017 CLINICAL DATA:  Acute onset neck pain while driving. EXAM: CT HEAD WITHOUT CONTRAST CT CERVICAL SPINE WITHOUT CONTRAST TECHNIQUE: Multidetector CT imaging of the head and cervical spine was performed following the standard protocol without  intravenous contrast. Multiplanar CT image reconstructions of the cervical spine were also generated. COMPARISON:  MRI brain dated February 10, 2017. CT of the head and neck dated January 21, 2017. FINDINGS: CT HEAD FINDINGS Brain: No evidence of acute infarction, hemorrhage, hydrocephalus, extra-axial collection or mass lesion/mass effect. Vascular: No hyperdense vessel or unexpected calcification. Skull: Normal. Negative for fracture or focal lesion. Sinuses/Orbits: Small mucous retention cyst in the right sphenoid sinus. The remaining paranasal sinuses and mastoid air cells are clear. The orbits are unremarkable. Other: None. CT CERVICAL SPINE FINDINGS Alignment: Slight reversal of the normal cervical lordosis. Otherwise normal. Skull base and vertebrae: No acute fracture. No primary bone lesion or focal pathologic process. Soft tissues and spinal canal: No prevertebral fluid or swelling. No visible canal hematoma. Disc levels: Small central disc protrusions from C3-C4 through C6-C7. No high-grade spinal canal or neuroforaminal stenosis. Upper chest: Negative. Other: None. IMPRESSION: 1. Normal noncontrast head CT. 2.  No acute cervical spine fracture. 3. Minimal degenerative disc disease of the cervical spine. Electronically Signed   By: Titus Dubin M.D.   On: 03/13/2017 16:49  Ct Cervical Spine Wo Contrast  Result Date: 03/13/2017 CLINICAL DATA:  Acute onset neck pain while driving. EXAM: CT HEAD WITHOUT CONTRAST CT CERVICAL SPINE WITHOUT CONTRAST TECHNIQUE: Multidetector CT imaging of the head and cervical spine was performed following the standard protocol without intravenous contrast. Multiplanar CT image reconstructions of the cervical spine were also generated. COMPARISON:  MRI brain dated February 10, 2017. CT of the head and neck dated January 21, 2017. FINDINGS: CT HEAD FINDINGS Brain: No evidence of acute infarction, hemorrhage, hydrocephalus, extra-axial collection or mass lesion/mass effect. Vascular: No  hyperdense vessel or unexpected calcification. Skull: Normal. Negative for fracture or focal lesion. Sinuses/Orbits: Small mucous retention cyst in the right sphenoid sinus. The remaining paranasal sinuses and mastoid air cells are clear. The orbits are unremarkable. Other: None. CT CERVICAL SPINE FINDINGS Alignment: Slight reversal of the normal cervical lordosis. Otherwise normal. Skull base and vertebrae: No acute fracture. No primary bone lesion or focal pathologic process. Soft tissues and spinal canal: No prevertebral fluid or swelling. No visible canal hematoma. Disc levels: Small central disc protrusions from C3-C4 through C6-C7. No high-grade spinal canal or neuroforaminal stenosis. Upper chest: Negative. Other: None. IMPRESSION: 1. Normal noncontrast head CT. 2.  No acute cervical spine fracture. 3. Minimal degenerative disc disease of the cervical spine. Electronically Signed   By: Titus Dubin M.D.   On: 03/13/2017 16:49   Mr Cervical Spine Wo Contrast  Result Date: 03/14/2017 CLINICAL DATA:  Ebony Lewis a neck popping sound while driving, resulting in presyncope and arm tingling. History of neck bulge. EXAM: MRI CERVICAL SPINE WITHOUT CONTRAST TECHNIQUE: Multiplanar, multisequence MR imaging of the cervical spine was performed. No intravenous contrast was administered. COMPARISON:  CT cervical spine March 13, 2017 and MRI of the cervical spine August 09, 2010 FINDINGS: ALIGNMENT: Straightened cervical lordosis.  No malalignment. VERTEBRAE/DISCS: Vertebral bodies are intact. Intervertebral disc morphology's and signal are normal. No abnormal or acute bone marrow signal. CORD:Cervical spinal cord is normal morphology and signal characteristics from the cervicomedullary junction to level of T1-2, the most caudal well visualized level. POSTERIOR FOSSA, VERTEBRAL ARTERIES, PARASPINAL TISSUES: No MR findings of ligamentous injury. Vertebral artery flow voids present. Included posterior fossa and paraspinal  soft tissues are normal. DISC LEVELS: C2-3: No disc bulge, canal stenosis nor neural foraminal narrowing. C3-4, C4-5: Stable small central disc protrusion without canal stenosis or neural foraminal narrowing. C5-6: Stable small central disc protrusion uncovertebral hypertrophy. No canal stenosis or neural foraminal narrowing. C6-7: Stable tiny LEFT central disc protrusion without canal stenosis or neural foraminal narrowing. C7-T1: No disc bulge, canal stenosis nor neural foraminal narrowing. IMPRESSION: 1. Stable examination: Multilevel small disc protrusions without canal stenosis or neural foraminal narrowing. Electronically Signed   By: Elon Alas M.D.   On: 03/14/2017 01:21    Results reviewed and shared with patient. Patient is scheduled for neurosurgery follow-up next week.   Etta Quill, NP 03/14/17 0144    Macarthur Critchley, MD 03/22/17 [phone]

## 2017-03-14 NOTE — Telephone Encounter (Signed)
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   Can we work her in to be seen sooner?

## 2017-03-14 NOTE — Telephone Encounter (Signed)
Pt states she was in the ED yesterday, and would like to know if Dr. Rockey Situ, or an extender can see her earlier than 9/20. Please advise.

## 2017-03-14 NOTE — ED Notes (Signed)
EDP at bedside  

## 2017-03-18 LAB — IFE, PE AND FLC, SERUM
Albumin SerPl Elph-Mcnc: 4 g/dL (ref 2.9–4.4)
Albumin/Glob SerPl: 1.4 (ref 0.7–1.7)
Alpha 1: 0.2 g/dL (ref 0.0–0.4)
Alpha2 Glob SerPl Elph-Mcnc: 0.6 g/dL (ref 0.4–1.0)
B-Globulin SerPl Elph-Mcnc: 0.9 g/dL (ref 0.7–1.3)
Gamma Glob SerPl Elph-Mcnc: 1.3 g/dL (ref 0.4–1.8)
Globulin, Total: 3 g/dL (ref 2.2–3.9)
Ig Kappa Free Light Chain: 15 mg/L (ref 3.3–19.4)
Ig Lambda Free Light Chain: 20.9 mg/L (ref 5.7–26.3)
IgA/Immunoglobulin A, Serum: 191 mg/dL (ref 87–352)
IgG (Immunoglobin G), Serum: 1150 mg/dL (ref 700–1600)
IgM (Immunoglobulin M), Srm: 118 mg/dL (ref 26–217)
Kappa/Lambda FluidC Ratio: 0.72 (ref 0.26–1.65)
Total Protein: 7 g/dL (ref 6.0–8.5)

## 2017-03-18 LAB — ANA: Anti Nuclear Antibody(ANA): NEGATIVE

## 2017-03-18 LAB — RHEUMATOID FACTOR: Rhuematoid fact SerPl-aCnc: <10 IU/mL (ref 0.0–13.9)

## 2017-03-20 ENCOUNTER — Encounter: Payer: Self-pay | Admitting: *Deleted

## 2017-03-20 NOTE — Telephone Encounter (Signed)
Nothing sooner available for scheduling .   Added to wait list

## 2017-03-21 DIAGNOSIS — M542 Cervicalgia: Secondary | ICD-10-CM | POA: Diagnosis not present

## 2017-03-26 DIAGNOSIS — Z113 Encounter for screening for infections with a predominantly sexual mode of transmission: Secondary | ICD-10-CM | POA: Diagnosis not present
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
## 2017-04-03 ENCOUNTER — Ambulatory Visit (INDEPENDENT_AMBULATORY_CARE_PROVIDER_SITE_OTHER): Payer: 59

## 2017-04-03 ENCOUNTER — Encounter: Payer: Self-pay | Admitting: Cardiovascular Disease

## 2017-04-03 ENCOUNTER — Ambulatory Visit (INDEPENDENT_AMBULATORY_CARE_PROVIDER_SITE_OTHER): Payer: 59 | Admitting: Cardiovascular Disease

## 2017-04-03 VITALS — BP 100/60 | HR 80 | Ht 69.0 in | Wt 168.5 lb

## 2017-04-03 DIAGNOSIS — R002 Palpitations: Secondary | ICD-10-CM

## 2017-04-03 DIAGNOSIS — R55 Syncope and collapse: Secondary | ICD-10-CM

## 2017-04-03 DIAGNOSIS — I951 Orthostatic hypotension: Secondary | ICD-10-CM | POA: Diagnosis not present

## 2017-04-03 DIAGNOSIS — R42 Dizziness and giddiness: Secondary | ICD-10-CM

## 2017-04-03 DIAGNOSIS — F419 Anxiety disorder, unspecified: Secondary | ICD-10-CM

## 2017-04-03 NOTE — Patient Instructions (Addendum)
Medication Instructions:   No medication changes made  Research florinef/fludrocortisone (one a day) Research midodrine (up to three times a day)  Labwork:  No new labs needed  Testing/Procedures:  We will order an echocardiogram for dizzy, orthostasis, near syncope Zio patch for palpitations, orthostasis, near syncope Exercise treadmill for dizzy, palpitations, near syncope   Follow-Up: It was a pleasure seeing you in the office today. Please call us if you have new issues that need to be addressed before your next appt.  [phone]  Your physician wants you to follow-up in:  As needed  If you need a refill on your cardiac medications before your next appointment, please call your pharmacy.

## 2017-04-05 DIAGNOSIS — R55 Syncope and collapse: Secondary | ICD-10-CM | POA: Diagnosis not present

## 2017-04-06 LAB — CBC WITH DIFFERENTIAL/PLATELET
Basophils Absolute: 0.1 10*3/uL (ref 0.0–0.2)
Basos: 1 %
EOS (ABSOLUTE): 0.6 10*3/uL — ABNORMAL HIGH (ref 0.0–0.4)
Eos: 5 %
Hematocrit: 40.3 % (ref 34.0–46.6)
Hemoglobin: 13.7 g/dL (ref 11.1–15.9)
Immature Grans (Abs): 0 10*3/uL (ref 0.0–0.1)
Immature Granulocytes: 0 %
Lymphocytes Absolute: 2.9 10*3/uL (ref 0.7–3.1)
Lymphs: 27 %
MCH: 30.4 pg (ref 26.6–33.0)
MCHC: 34 g/dL (ref 31.5–35.7)
MCV: 89 fL (ref 79–97)
Monocytes Absolute: 1.1 10*3/uL — ABNORMAL HIGH (ref 0.1–0.9)
Monocytes: 10 %
Neutrophils Absolute: 6.2 10*3/uL (ref 1.4–7.0)
Neutrophils: 57 %
Platelets: 322 10*3/uL (ref 150–379)
RBC: 4.51 x10E6/uL (ref 3.77–5.28)
RDW: 12.6 % (ref 12.3–15.4)
WBC: 10.9 10*3/uL — ABNORMAL HIGH (ref 3.4–10.8)

## 2017-04-06 LAB — SPECIMEN STATUS REPORT

## 2017-04-11 DIAGNOSIS — R42 Dizziness and giddiness: Secondary | ICD-10-CM | POA: Diagnosis not present

## 2017-04-11 DIAGNOSIS — R2689 Other abnormalities of gait and mobility: Secondary | ICD-10-CM | POA: Diagnosis not present

## 2017-04-11 DIAGNOSIS — H8110 Benign paroxysmal vertigo, unspecified ear: Secondary | ICD-10-CM | POA: Diagnosis not present

## 2017-04-11 DIAGNOSIS — H8111 Benign paroxysmal vertigo, right ear: Secondary | ICD-10-CM | POA: Diagnosis not present

## 2017-04-14 DIAGNOSIS — I951 Orthostatic hypotension: Secondary | ICD-10-CM | POA: Diagnosis not present

## 2017-04-14 DIAGNOSIS — R002 Palpitations: Secondary | ICD-10-CM

## 2017-04-14 DIAGNOSIS — R55 Syncope and collapse: Secondary | ICD-10-CM

## 2017-04-14 DIAGNOSIS — R42 Dizziness and giddiness: Secondary | ICD-10-CM

## 2017-04-22 ENCOUNTER — Ambulatory Visit (INDEPENDENT_AMBULATORY_CARE_PROVIDER_SITE_OTHER): Payer: 59

## 2017-04-22 ENCOUNTER — Other Ambulatory Visit: Payer: Self-pay

## 2017-04-22 DIAGNOSIS — R002 Palpitations: Secondary | ICD-10-CM | POA: Diagnosis not present

## 2017-04-22 DIAGNOSIS — I951 Orthostatic hypotension: Secondary | ICD-10-CM | POA: Diagnosis not present

## 2017-04-22 DIAGNOSIS — R2689 Other abnormalities of gait and mobility: Secondary | ICD-10-CM | POA: Diagnosis not present

## 2017-04-22 DIAGNOSIS — R55 Syncope and collapse: Secondary | ICD-10-CM

## 2017-04-22 DIAGNOSIS — R42 Dizziness and giddiness: Secondary | ICD-10-CM

## 2017-04-22 DIAGNOSIS — H8110 Benign paroxysmal vertigo, unspecified ear: Secondary | ICD-10-CM | POA: Diagnosis not present

## 2017-04-27 LAB — EXERCISE TOLERANCE TEST
Estimated workload: 7.3 METS
Exercise duration (min): 6 min
Exercise duration (sec): 16 s
MPHR: 191 {beats}/min
Peak HR: 166 {beats}/min
Percent HR: 86 %
Rest HR: 98 {beats}/min

## 2017-04-29 MED FILL — VENTOLIN HFA 90 MCG INHALER: 108 (90 BAS | 25 days supply | Qty: 18 | Fill #4

## 2017-05-01 ENCOUNTER — Telehealth: Payer: Self-pay | Admitting: Obstetrics and Gynecology

## 2017-05-01 DIAGNOSIS — R002 Palpitations: Secondary | ICD-10-CM | POA: Diagnosis not present

## 2017-05-01 NOTE — Telephone Encounter (Signed)
Pt aware of neg MyRisk testing. IBIS=22%/riskscore=34.6%. Pt's mat grt aunt tested pos for breast cancer gene and pt will see if she can get a copy of those results. Pt's mom and MGM deceased from cancer so can't test them (to get closer relative to pt).  Discussed monthly SBE, yearly CBE and mammos starting age 29 and screening breast MRI. If pt can get copy of genetic mutation in family, her lifetime breast cancer risk may be less than 34%, but no harm in starting mammos at age 89 regardless (mom diagnosed age 63).  Hard copy mailed to pt. F/u prn questions.  Annual due 7/19.

## 2017-05-15 ENCOUNTER — Telehealth: Payer: 59 | Admitting: Family

## 2017-05-15 DIAGNOSIS — B9689 Other specified bacterial agents as the cause of diseases classified elsewhere: Secondary | ICD-10-CM

## 2017-05-15 DIAGNOSIS — J028 Acute pharyngitis due to other specified organisms: Secondary | ICD-10-CM | POA: Diagnosis not present

## 2017-05-15 MED ORDER — DOXYCYCLINE HYCLATE 100 MG PO TABS: 100.0000 mg | ORAL_TABLET | Freq: Two times a day (BID) | ORAL | 0 refills | Status: DC

## 2017-05-15 MED ORDER — BENZONATATE 100 MG PO CAPS: 100.0000 mg | ORAL_CAPSULE | Freq: Three times a day (TID) | ORAL | 0 refills | Status: DC | PRN

## 2017-05-15 MED ORDER — PREDNISONE 5 MG PO TABS: 5.0000 mg | ORAL_TABLET | ORAL | 0 refills | Status: DC

## 2017-05-15 MED FILL — BENZONATATE 100 MG CAPS: 100 | 5 days supply | Qty: 30 | Fill #0

## 2017-05-15 MED FILL — DOXYCYCLINE HYCLATE 100 MG: 100 | 7 days supply | Qty: 14 | Fill #0

## 2017-05-15 MED FILL — predniSONE 5 MG TABS: 5 | 6 days supply | Qty: 21 | Fill #0

## 2017-05-15 NOTE — Progress Notes (Signed)
Thank you for the details you included in the comment boxes. Those details are very helpful in determining the best course of treatment for you and help [place] to provide the best care.  We are sorry that you are not feeling well.  Here is how we plan to help!  Based on your presentation I believe you most likely have A cough due to bacteria.  When patients have a fever and a productive cough with a change in color or increased sputum production, we are concerned about bacterial bronchitis.  If left untreated it can progress to pneumonia.  If your symptoms do not improve with your treatment plan it is important that you contact your provider.   I have prescribed Doxycycline 100 mg twice a day for 7 days     In addition you may use A non-prescription cough medication called Mucinex DM: take 2 tablets every 12 hours. and A prescription cough medication called Tessalon Perles 100mg . You may take 1-2 capsules every 8 hours as needed for your cough.  Sterapred 5 mg dosepak  From your responses in the eVisit questionnaire you describe inflammation in the upper respiratory tract which is causing a significant cough.  This is commonly called Bronchitis and has four common causes:    Allergies  Viral Infections  Acid Reflux  Bacterial Infection Allergies, viruses and acid reflux are treated by controlling symptoms or eliminating the cause. An example might be a cough caused by taking certain blood pressure medications. You stop the cough by changing the medication. Another example might be a cough caused by acid reflux. Controlling the reflux helps control the cough.  USE OF BRONCHODILATOR ("RESCUE") INHALERS: There is a risk from using your bronchodilator too frequently.  The risk is that over-reliance on a medication which only relaxes the muscles surrounding the breathing tubes can reduce the effectiveness of medications prescribed to reduce swelling and congestion of the tubes themselves.  Although you  feel brief relief from the bronchodilator inhaler, your asthma may actually be worsening with the tubes becoming more swollen and filled with mucus.  This can delay other crucial treatments, such as oral steroid medications. If you need to use a bronchodilator inhaler daily, several times per day, you should discuss this with your provider.  There are probably better treatments that could be used to keep your asthma under control.     HOME CARE . Only take medications as instructed by your medical team. . Complete the entire course of an antibiotic. . Drink plenty of fluids and get plenty of rest. . Avoid close contacts especially the very young and the elderly . Cover your mouth if you cough or cough into your sleeve. . Always remember to wash your hands . A steam or ultrasonic humidifier can help congestion.   GET HELP RIGHT AWAY IF: . You develop worsening fever. . You become short of breath . You cough up blood. . Your symptoms persist after you have completed your treatment plan MAKE SURE YOU   Understand these instructions.  Will watch your condition.  Will get help right away if you are not doing well or get worse.  Your e-visit answers were reviewed by a board certified advanced clinical practitioner to complete your personal care plan.  Depending on the condition, your plan could have included both over the counter or prescription medications. If there is a problem please reply  once you have received a response from your provider. Your safety is important to [place].  If you have drug allergies check your prescription carefully.    You can use MyChart to ask questions about today's visit, request a non-urgent call back, or ask for a work or school excuse for 24 hours related to this e-Visit. If it has been greater than 24 hours you will need to follow up with your provider, or enter a new e-Visit to address those concerns. You will get an e-mail in the next two days asking about  your experience.  I hope that your e-visit has been valuable and will speed your recovery. Thank you for using e-visits.

## 2017-05-27 MED FILL — VENTOLIN HFA 90 MCG INHALER: 108 (90 BAS | 25 days supply | Qty: 18 | Fill #5

## 2017-05-27 MED FILL — ALBUTEROL 0.083% INHAL SOLN: (2.5 MG/3ML | 6 days supply | Qty: 75 | Fill #0

## 2017-06-06 DIAGNOSIS — R42 Dizziness and giddiness: Secondary | ICD-10-CM | POA: Diagnosis not present

## 2017-06-06 DIAGNOSIS — R2689 Other abnormalities of gait and mobility: Secondary | ICD-10-CM | POA: Diagnosis not present

## 2017-06-06 DIAGNOSIS — H8110 Benign paroxysmal vertigo, unspecified ear: Secondary | ICD-10-CM | POA: Diagnosis not present

## 2017-06-26 ENCOUNTER — Other Ambulatory Visit: Payer: Self-pay | Admitting: Cardiovascular Disease

## 2017-06-26 ENCOUNTER — Telehealth: Payer: 59 | Admitting: Family

## 2017-06-26 DIAGNOSIS — B9689 Other specified bacterial agents as the cause of diseases classified elsewhere: Secondary | ICD-10-CM

## 2017-06-26 DIAGNOSIS — J329 Chronic sinusitis, unspecified: Secondary | ICD-10-CM | POA: Diagnosis not present

## 2017-06-26 DIAGNOSIS — R062 Wheezing: Secondary | ICD-10-CM

## 2017-06-26 MED ORDER — LEVOFLOXACIN 500 MG PO TABS: 500.0000 mg | ORAL_TABLET | Freq: Every day | ORAL | 0 refills | Status: DC

## 2017-06-26 MED FILL — levoFLOXacin 500 MG TABS: 500 | 10 days supply | Qty: 10 | Fill #0

## 2017-06-26 NOTE — Progress Notes (Signed)
Thank you for the details you included in the comment boxes. Those details are very helpful in determining the best course of treatment for you and help [place] to provide the best care. We normally don't do Levaquin for 10 days due to renal risk but given your young age and the verified history of the ENT giving you 10 day courses (in your chart here) with success, I will do the same.   We are sorry that you are not feeling well.  Here is how we plan to help!  Based on what you have shared with me it looks like you have sinusitis.  Sinusitis is inflammation and infection in the sinus cavities of the head.  Based on your presentation I believe you most likely have Acute Bacterial Sinusitis.  This is an infection caused by bacteria and is treated with antibiotics. I have prescribed Levofloxicin 500mg  by mouth once daily for 10 days. You may use an oral decongestant such as Mucinex D or if you have glaucoma or high blood pressure use plain Mucinex. Saline nasal spray help and can safely be used as often as needed for congestion.  If you develop worsening sinus pain, fever or notice severe headache and vision changes, or if symptoms are not better after completion of antibiotic, please schedule an appointment with a health care provider.    Sinus infections are not as easily transmitted as other respiratory infection, however we still recommend that you avoid close contact with loved ones, especially the very young and elderly.  Remember to wash your hands thoroughly throughout the day as this is the number one way to prevent the spread of infection!  Home Care:  Only take medications as instructed by your medical team.  Complete the entire course of an antibiotic.  Do not take these medications with alcohol.  A steam or ultrasonic humidifier can help congestion.  You can place a towel over your head and breathe in the steam from hot water coming from a faucet.  Avoid close contacts especially the very  young and the elderly.  Cover your mouth when you cough or sneeze.  Always remember to wash your hands.  Get Help Right Away If:  You develop worsening fever or sinus pain.  You develop a severe head ache or visual changes.  Your symptoms persist after you have completed your treatment plan.  Make sure you  Understand these instructions.  Will watch your condition.  Will get help right away if you are not doing well or get worse.  Your e-visit answers were reviewed by a board certified advanced clinical practitioner to complete your personal care plan.  Depending on the condition, your plan could have included both over the counter or prescription medications.  If there is a problem please reply  once you have received a response from your provider.  Your safety is important to [place].  If you have drug allergies check your prescription carefully.    You can use MyChart to ask questions about today's visit, request a non-urgent call back, or ask for a work or school excuse for 24 hours related to this e-Visit. If it has been greater than 24 hours you will need to follow up with your provider, or enter a new e-Visit to address those concerns.  You will get an e-mail in the next two days asking about your experience.  I hope that your e-visit has been valuable and will speed your recovery. Thank you for using e-visits.

## 2017-06-26 NOTE — Telephone Encounter (Signed)
Pt's pharmacy requesting a refill on albuterol inhaler. Would you like to refill this medication? Please advise

## 2017-06-27 ENCOUNTER — Ambulatory Visit: Payer: Self-pay | Admitting: *Deleted

## 2017-06-27 MED ORDER — AMOXICILLIN-POT CLAVULANATE 875-125 MG PO TABS: 1.0000 | ORAL_TABLET | Freq: Two times a day (BID) | ORAL | 0 refills | Status: DC

## 2017-06-27 MED FILL — AMOX-CLAV 875-125 MG TABLET: 875-125 | 10 days supply | Qty: 20 | Fill #0

## 2017-06-27 MED FILL — VENTOLIN HFA 90 MCG INHALER: 108 (90 BAS | 25 days supply | Qty: 18 | Fill #0

## 2017-06-27 NOTE — Telephone Encounter (Signed)
Ebony Lewis called to report a medication reaction she is having today with 1st dose of Levaquin which she was prescribed yesterday for sinusitis over an e-visit. About an hour after she took the Levaquin this morning, she began having a pain in her achilles tendon and a burning sensation around her ankles.   She is requesting Augmentin 875mg  twice a day for 10 days, to be sent to [facility].

## 2017-06-27 NOTE — Telephone Encounter (Signed)
augmentin bid x x 10 days sent to [facility] .

## 2017-06-27 NOTE — Telephone Encounter (Signed)
Patient says that it is not really an allergy. She doesn't have a reaction to amoxicillin except for upset stomach at first.

## 2017-06-27 NOTE — Telephone Encounter (Signed)
Left message to make patient aware.

## 2017-06-27 NOTE — Telephone Encounter (Signed)
please confirm that she can take augmentin since an amoxicillin allergy is documented

## 2017-06-27 NOTE — Telephone Encounter (Signed)
Please advise 

## 2017-06-27 NOTE — Telephone Encounter (Signed)
LMTCB

## 2017-07-28 DIAGNOSIS — H5213 Myopia, bilateral: Secondary | ICD-10-CM | POA: Diagnosis not present

## 2017-07-28 DIAGNOSIS — H52223 Regular astigmatism, bilateral: Secondary | ICD-10-CM | POA: Diagnosis not present

## 2017-11-28 ENCOUNTER — Encounter: Payer: Self-pay | Admitting: Family Medicine

## 2017-12-03 ENCOUNTER — Encounter: Payer: Self-pay | Admitting: Family Medicine

## 2018-04-28 ENCOUNTER — Other Ambulatory Visit: Payer: Self-pay | Admitting: Obstetrics & Gynecology

## 2018-04-28 DIAGNOSIS — R599 Enlarged lymph nodes, unspecified: Secondary | ICD-10-CM

## 2018-04-28 DIAGNOSIS — N644 Mastodynia: Secondary | ICD-10-CM

## 2018-06-04 ENCOUNTER — Other Ambulatory Visit: Payer: Self-pay | Admitting: Family

## 2018-06-04 DIAGNOSIS — R002 Palpitations: Secondary | ICD-10-CM

## 2018-06-10 ENCOUNTER — Ambulatory Visit: Payer: 59

## 2018-08-07 ENCOUNTER — Ambulatory Visit
Admission: RE | Admit: 2018-08-07 | Discharge: 2018-08-07 | Disposition: A | Discharge status: Home / Self Care | Payer: Managed Care, Other (non HMO) | Source: Ambulatory Visit | Attending: Obstetrics & Gynecology | Admitting: Obstetrics & Gynecology

## 2018-08-07 DIAGNOSIS — N644 Mastodynia: Secondary | ICD-10-CM | POA: Insufficient documentation

## 2018-08-07 DIAGNOSIS — R599 Enlarged lymph nodes, unspecified: Secondary | ICD-10-CM | POA: Insufficient documentation

## 2018-08-11 ENCOUNTER — Other Ambulatory Visit: Payer: Self-pay | Admitting: Obstetrics & Gynecology

## 2018-08-11 DIAGNOSIS — R928 Other abnormal and inconclusive findings on diagnostic imaging of breast: Secondary | ICD-10-CM

## 2018-08-11 DIAGNOSIS — N6489 Other specified disorders of breast: Secondary | ICD-10-CM

## 2018-08-13 ENCOUNTER — Ambulatory Visit
Admission: RE | Admit: 2018-08-13 | Discharge: 2018-08-13 | Disposition: A | Discharge status: Home / Self Care | Payer: Managed Care, Other (non HMO) | Source: Ambulatory Visit | Attending: Obstetrics & Gynecology | Admitting: Obstetrics & Gynecology

## 2018-08-13 DIAGNOSIS — R928 Other abnormal and inconclusive findings on diagnostic imaging of breast: Secondary | ICD-10-CM

## 2018-08-13 DIAGNOSIS — N6489 Other specified disorders of breast: Secondary | ICD-10-CM

## 2018-08-13 HISTORY — PX: BREAST BIOPSY: SHX20

## 2018-08-14 LAB — SURGICAL PATHOLOGY

## 2018-12-17 ENCOUNTER — Other Ambulatory Visit: Payer: Self-pay | Admitting: Obstetrics & Gynecology

## 2018-12-17 DIAGNOSIS — N6489 Other specified disorders of breast: Secondary | ICD-10-CM

## 2018-12-17 DIAGNOSIS — R928 Other abnormal and inconclusive findings on diagnostic imaging of breast: Secondary | ICD-10-CM

## 2018-12-24 ENCOUNTER — Other Ambulatory Visit: Payer: Self-pay

## 2018-12-24 ENCOUNTER — Ambulatory Visit
Admission: RE | Admit: 2018-12-24 | Discharge: 2018-12-24 | Disposition: A | Discharge status: Home / Self Care | Payer: Managed Care, Other (non HMO) | Source: Ambulatory Visit | Attending: Obstetrics & Gynecology | Admitting: Obstetrics & Gynecology

## 2018-12-24 DIAGNOSIS — N6489 Other specified disorders of breast: Secondary | ICD-10-CM | POA: Diagnosis present

## 2018-12-24 DIAGNOSIS — R928 Other abnormal and inconclusive findings on diagnostic imaging of breast: Secondary | ICD-10-CM | POA: Insufficient documentation

## 2019-02-24 IMAGING — MR MR BRAIN/IAC WO/W
8 of 12 series · 25 of 48 positions shown · IV contrast (multihance)
Comparison: 01/21/2017 CT head

CLINICAL DATA: 31 y/o F; 6 months of feeling off balance,
dizziness, near syncope, vision changes, and short-term memory loss.

EXAM:
MRI HEAD WITHOUT AND WITH CONTRAST
TECHNIQUE: Multiplanar, multiecho pulse sequences of the brain and surrounding
structures were obtained without and with intravenous contrast.
CONTRAST:  15mL MULTIHANCE GADOBENATE DIMEGLUMINE 529 MG/ML IV SOLN

[Series 2: T1 · sagittal · 5.0mm · 0.45mm/px · 4 of 29 slices shown (1 of 3)]
[im 1/29]
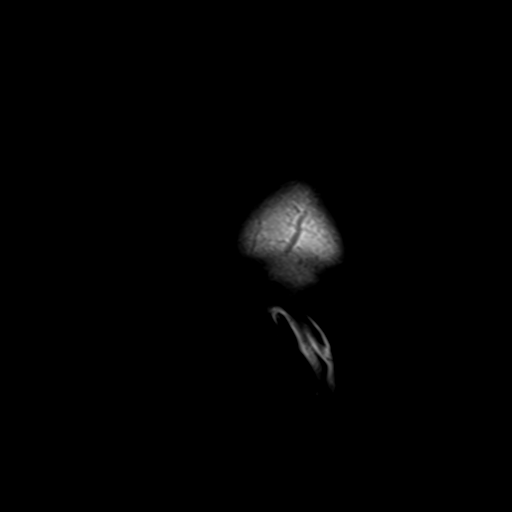
[im 10/29]
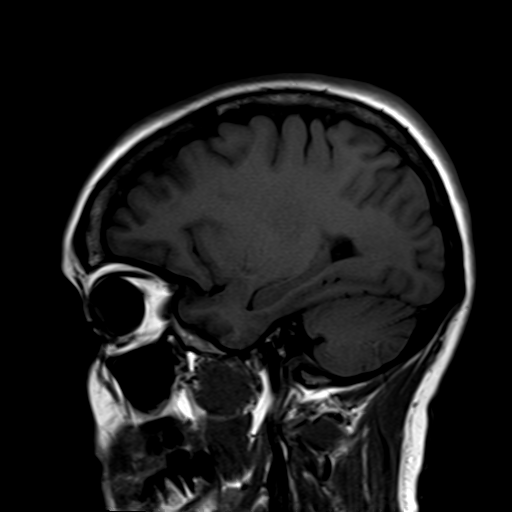
[im 19/29]
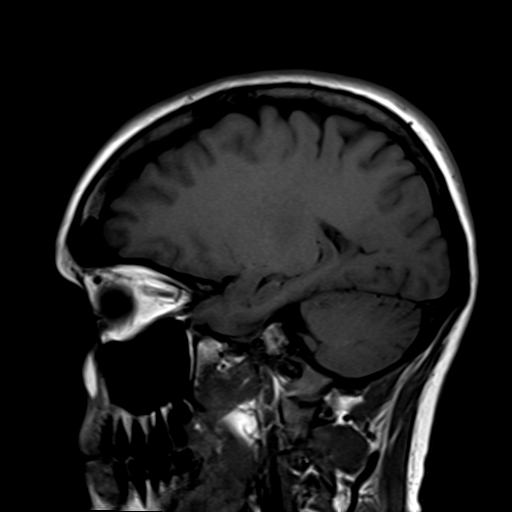
[im 29/29]
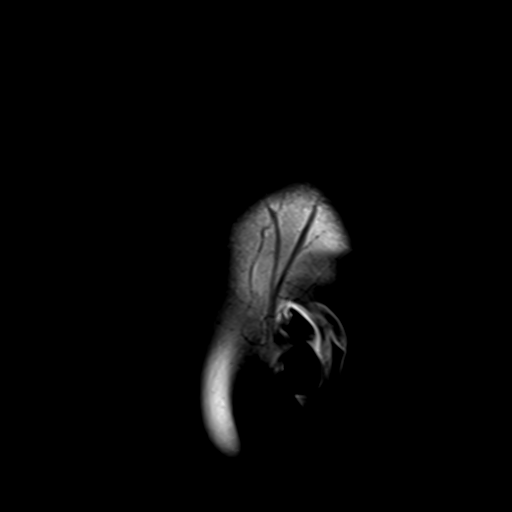

[Series 4: DWI · axial · 3.0mm · 1.80mm/px · z∈[-47,+112]mm · 4 of 42 slices shown]
[im 1/42]
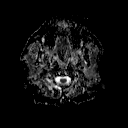
[im 14/42]
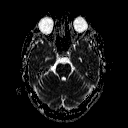
[im 28/42]
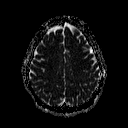
[im 42/42]
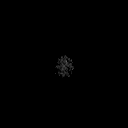

[Series 5: T2 · axial · 5.0mm · 0.60mm/px · z∈[-42,+113]mm · 3 of 25 slices shown (1 of 3)]
[im 1/25]
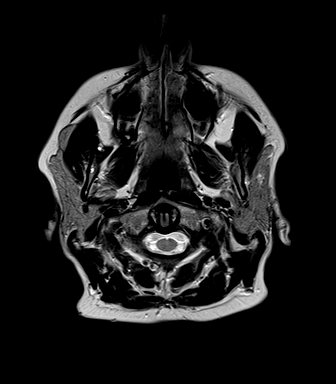
[im 13/25]
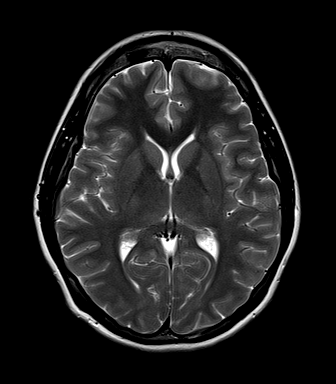
[im 25/25]
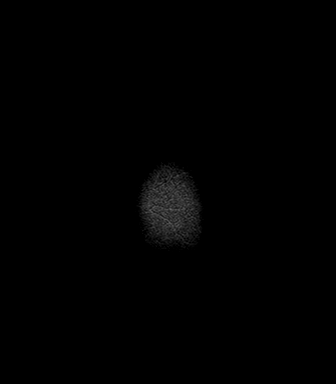

[Series 6: T2 · axial · 5.0mm · 0.45mm/px · z∈[-42,+113]mm · 3 of 25 slices shown (2 of 3)]
[im 1/25]
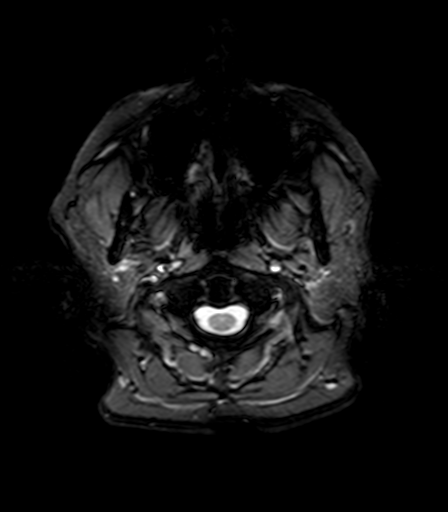
[im 13/25]
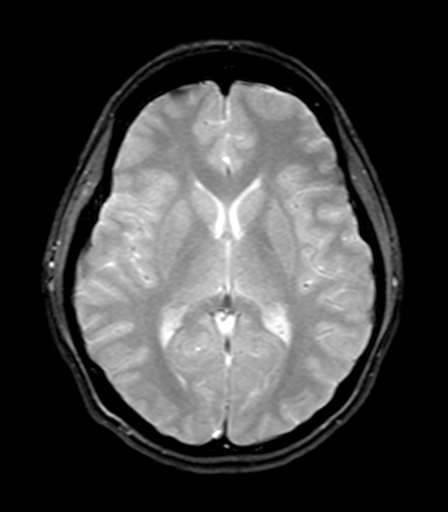
[im 25/25]
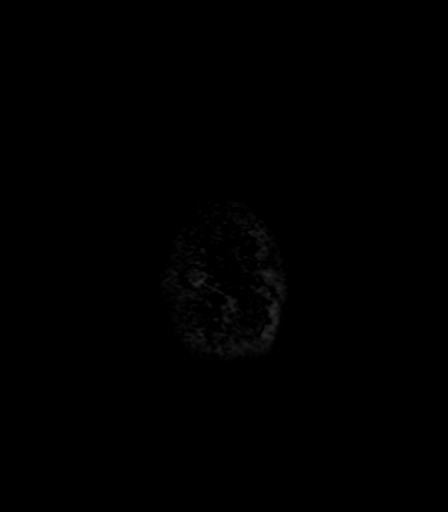

[Series 7: FLAIR · axial · 3.0mm · 0.45mm/px · z∈[-40,+115]mm · 5 of 53 slices shown]
[im 1/53]
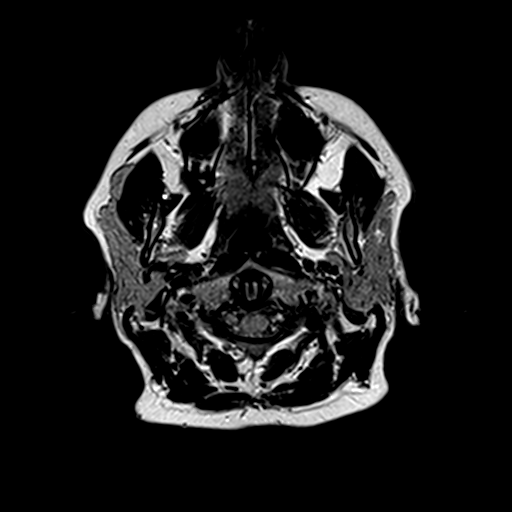
[im 14/53]
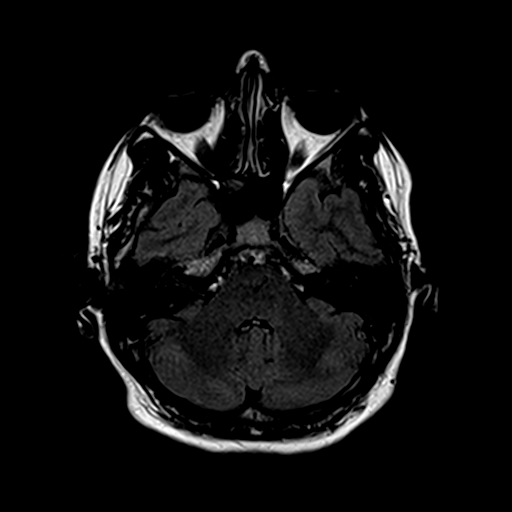
[im 27/53]
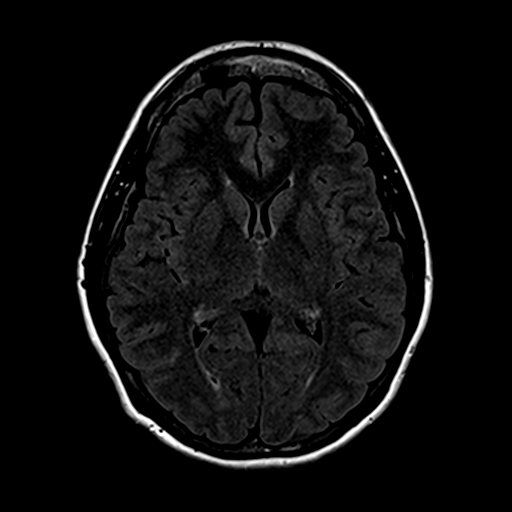
[im 40/53]
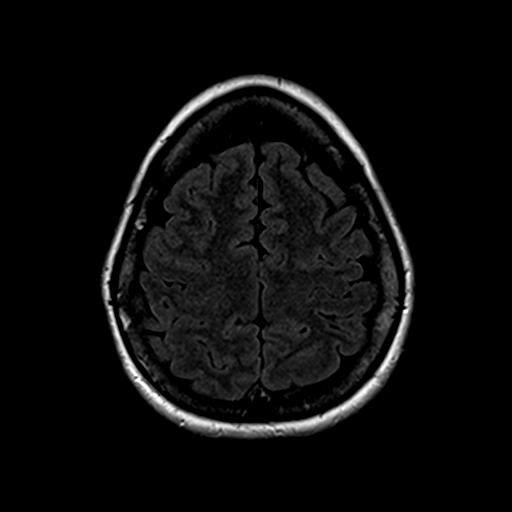
[im 53/53]
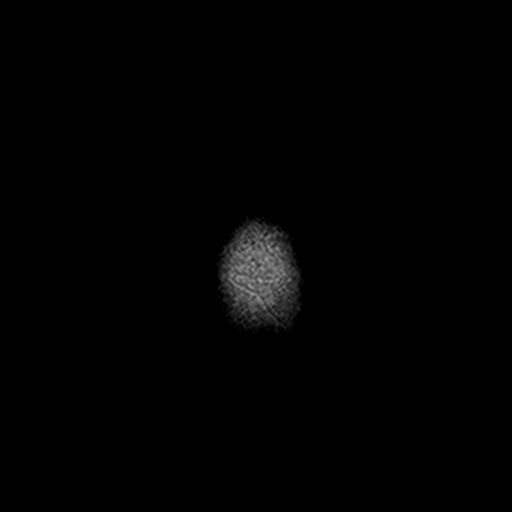

[Series 8: T2 · axial · 1.0mm · 0.35mm/px · z∈[-38,+1]mm · 4 of 40 slices shown (3 of 3)]
[im 1/40]
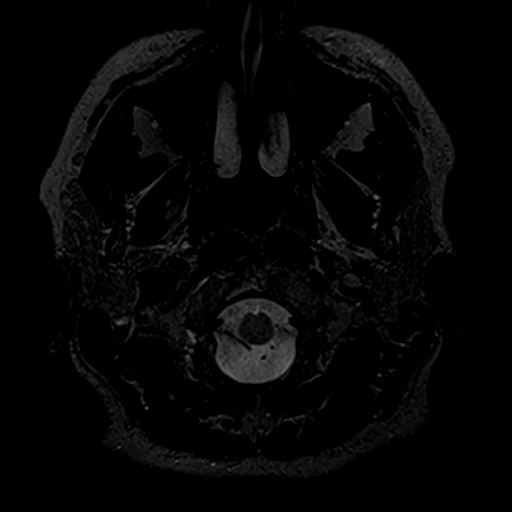
[im 14/40]
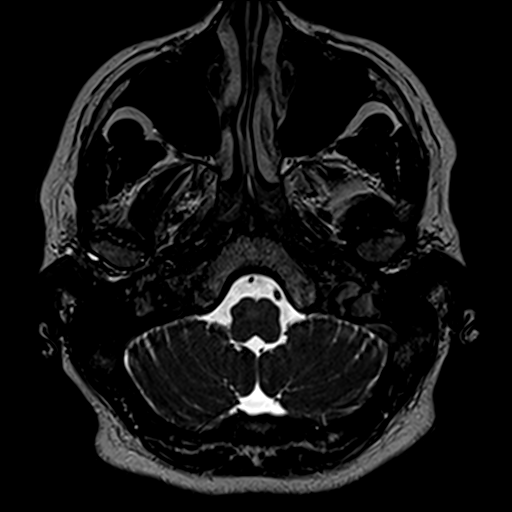
[im 27/40]
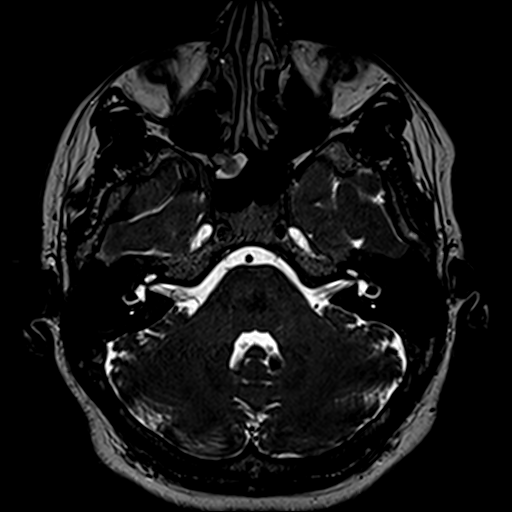
[im 40/40]
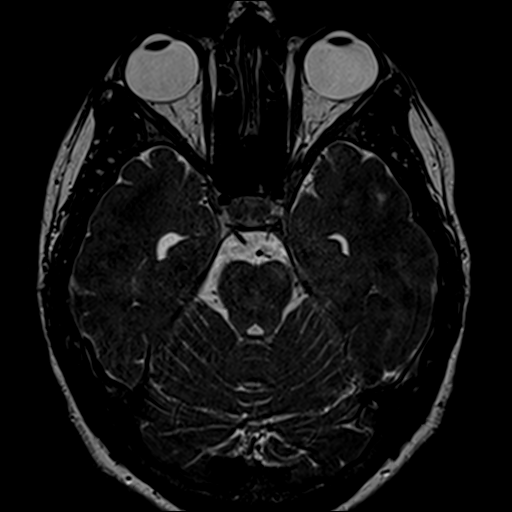

[Series 9: T1 · axial · 3.0mm · 0.39mm/px · 1 of 13 slices shown (2 of 3)]
[im 1/13]
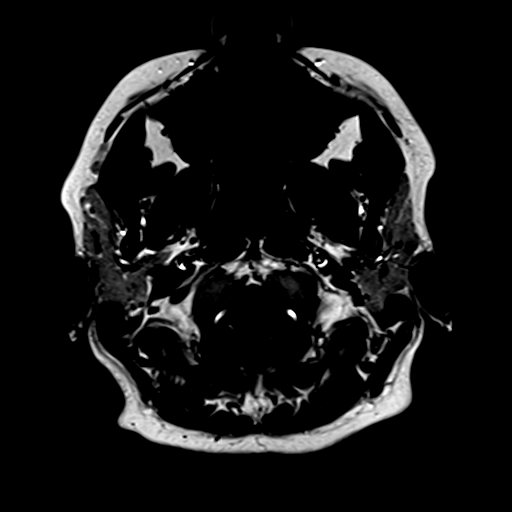

[Series 10: T1 · coronal · 3.0mm · 0.78mm/px · 1 of 11 slices shown (3 of 3)]
[im 1/11]
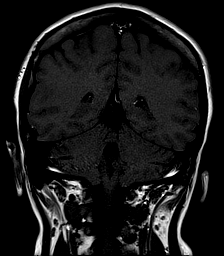

[25 of 48 positions shown; findings below may reference images not displayed]

FINDINGS: Brain: No acute infarction, hemorrhage, hydrocephalus, extra-axial
collection or mass lesion. Single nonspecific focus of T2 FLAIR
hyperintensity in left frontal periventricular white matter. No
abnormal enhancement of the brain.

Internal auditory canal: No mass or abnormal enhancement. Cranial
nerve 7 and 8 complexes are intact. Inner ear structures are
morphologically normal and demonstrate normal signal. No high-riding
jugular bulb. Normal looping of the AICA into the internal auditory
canals. No significant neurovascular compression.

Vascular: Normal flow voids.

Skull and upper cervical spine: Normal marrow signal.

Sinuses/Orbits: Partial ethmoidectomy. No abnormal signal of
paranasal sinuses or mastoid air cells. Normal orbits.

Other: None.
IMPRESSION: 1. No mass or abnormal enhancement of the cranial nerve 7 and 8
complexes. Inner ear structures are morphologically normal and
demonstrate normal signal.
2. No acute process or abnormal enhancement of the brain.
3. Single nonspecific focus of T2 FLAIR hyperintensity in left
frontal periventricular white matter of unlikely clinical
significance. Otherwise no signal abnormality of the brain.

By: Dieulis Guerilus M.D.

## 2019-09-08 ENCOUNTER — Other Ambulatory Visit: Payer: Self-pay

## 2019-09-08 ENCOUNTER — Telehealth: Payer: Self-pay | Admitting: Cardiovascular Disease

## 2019-09-08 ENCOUNTER — Emergency Department: Payer: BC Managed Care – PPO

## 2019-09-08 ENCOUNTER — Emergency Department
Admission: EM | Admit: 2019-09-08 | Discharge: 2019-09-08 | Disposition: A | Discharge status: Home / Self Care | Payer: BC Managed Care – PPO | Attending: Student | Admitting: Student

## 2019-09-08 ENCOUNTER — Ambulatory Visit: Payer: Managed Care, Other (non HMO) | Admitting: Internal Medicine

## 2019-09-08 ENCOUNTER — Encounter: Payer: Self-pay | Admitting: Emergency Medicine

## 2019-09-08 DIAGNOSIS — Z79899 Other long term (current) drug therapy: Secondary | ICD-10-CM | POA: Diagnosis not present

## 2019-09-08 DIAGNOSIS — Z87891 Personal history of nicotine dependence: Secondary | ICD-10-CM | POA: Diagnosis not present

## 2019-09-08 DIAGNOSIS — Z8616 Personal history of COVID-19: Secondary | ICD-10-CM | POA: Insufficient documentation

## 2019-09-08 DIAGNOSIS — J4521 Mild intermittent asthma with (acute) exacerbation: Secondary | ICD-10-CM | POA: Diagnosis not present

## 2019-09-08 DIAGNOSIS — R0602 Shortness of breath: Secondary | ICD-10-CM | POA: Diagnosis present

## 2019-09-08 LAB — TROPONIN I (HIGH SENSITIVITY): Troponin I (High Sensitivity): 2 ng/L (ref <18)

## 2019-09-08 LAB — CBC
HCT: 42.6 % (ref 36.0–46.0)
Hemoglobin: 14.4 g/dL (ref 12.0–15.0)
MCH: 30.4 pg (ref 26.0–34.0)
MCHC: 33.8 g/dL (ref 30.0–36.0)
MCV: 90.1 fL (ref 80.0–100.0)
Platelets: 363 10*3/uL (ref 150–400)
RBC: 4.73 MIL/uL (ref 3.87–5.11)
RDW: 12.6 % (ref 11.5–15.5)
WBC: 13.3 10*3/uL — ABNORMAL HIGH (ref 4.0–10.5)
nRBC: 0 % (ref 0.0–0.2)

## 2019-09-08 LAB — BASIC METABOLIC PANEL
Anion gap: 9 (ref 5–15)
BUN: 8 mg/dL (ref 6–20)
CO2: 22 mmol/L (ref 22–32)
Calcium: 9.8 mg/dL (ref 8.9–10.3)
Chloride: 108 mmol/L (ref 98–111)
Creatinine, Ser: 0.66 mg/dL (ref 0.44–1.00)
GFR calc Af Amer: >60 mL/min (ref >60)
GFR calc non Af Amer: >60 mL/min (ref >60)
Glucose, Bld: 116 mg/dL — ABNORMAL HIGH (ref 70–99)
Potassium: 4 mmol/L (ref 3.5–5.1)
Sodium: 139 mmol/L (ref 135–145)

## 2019-09-08 MED ORDER — IPRATROPIUM-ALBUTEROL 0.5-2.5 (3) MG/3ML IN SOLN
3.0000 mL | Freq: Once | RESPIRATORY_TRACT | Status: AC
  Administered 2019-09-08: 21:00:00 3 mL via RESPIRATORY_TRACT
  Filled 2019-09-08: qty 9

## 2019-09-08 MED ORDER — IPRATROPIUM-ALBUTEROL 0.5-2.5 (3) MG/3ML IN SOLN
3.0000 mL | Freq: Once | RESPIRATORY_TRACT | Status: AC
  Administered 2019-09-08: 21:00:00 3 mL via RESPIRATORY_TRACT

## 2019-09-08 MED ORDER — SODIUM CHLORIDE 0.9 % IV BOLUS
1000.0000 mL | Freq: Once | INTRAVENOUS | Status: AC
  Administered 2019-09-08: 21:00:00 1000 mL via INTRAVENOUS

## 2019-09-08 MED ORDER — MAGNESIUM SULFATE 2 GM/50ML IV SOLN
2.0000 g | Freq: Once | INTRAVENOUS | Status: AC
  Administered 2019-09-08: 21:00:00 2 g via INTRAVENOUS
  Filled 2019-09-08: qty 50

## 2019-09-08 MED ORDER — METHYLPREDNISOLONE SODIUM SUCC 125 MG IJ SOLR
125.0000 mg | Freq: Once | INTRAMUSCULAR | Status: AC
  Administered 2019-09-08: 125 mg via INTRAVENOUS
  Filled 2019-09-08: qty 2

## 2019-09-08 NOTE — Telephone Encounter (Signed)
Spoke with patient and she reports increased palpitations and did have some problems with root canal and exposure to chemicals. Added on for DOD slot and notified [facility] Nurse for that provider. She confirmed appointment with provider and had no further questions at this time.

## 2019-09-08 NOTE — ED Triage Notes (Signed)
Pt in via POV, reports worsening shortness of breath x one week; using prescribed nebulizers, antibiotics, prednisone x 4 days without any relief.  Audible wheezing, increased work of breathing noted.  Vitals WDL.

## 2019-09-08 NOTE — ED Notes (Signed)
Patient discharged to home per MD order. Patient in stable condition, and deemed medically cleared by ED provider for discharge. Discharge instructions reviewed with patient/family using "Teach Back"; verbalized understanding of medication education and administration, and information about follow-up care. Denies further concerns. ° °

## 2019-09-08 NOTE — Telephone Encounter (Signed)
Spoke with patient and she is at the ED due to problems with breathing. She states that her heart rate is up, she is having more palpitations, and she is having a hard time breathing. Let her know to please call back if we can assist with anything.

## 2019-09-08 NOTE — ED Notes (Signed)
Pt states breathing is much improved, no shob noted at this time.

## 2019-09-08 NOTE — Telephone Encounter (Signed)
Patient called in and cancelled scheduled appointment this afternoon. States she was instructed to go to the ED for heart palpations and will call back later on

## 2019-09-08 NOTE — Telephone Encounter (Signed)
[facility] calling  States patient had an increased and irregular heart beat at appointment and doctor requested patient be seen today Added to Dr Darnelle Bos DOD slot

## 2019-09-08 NOTE — ED Provider Notes (Signed)
[facility] Emergency Department Provider Note  ____________________________________________   First MD Initiated Contact with Patient 09/08/19 2002     (approximate)  I have reviewed the triage vital signs and the nursing notes.  History  Chief Complaint Shortness of Breath and Chest Pain    HPI Ebony Lewis is a 34 y.o. female with a history of asthma who presents to the emergency department for shortness of breath, dry cough, and wheezing, consistent with prior asthma exacerbations.  Patient states symptoms have been ongoing for the last 3 to 4 days, progressively worsening.  She was prescribed prednisone, Augmentin, and azithromycin without significant improvement in her symptoms.  She has intermittently tried nebulizer treatments at home, but does not like how tachycardic and shaky they make her feel.  Reports history of COVID in early January from which she recovered completely.   Past Medical Hx Past Medical History:  Diagnosis Date  . Allergy   . Anxiety    Pt states PTSD s/p post delivery hemorrhage.  . Asthma   . Blood transfusion without reported diagnosis   . BRCA negative 01/2017  . Depression   . Family history of breast cancer   . Genetic testing of female 01/2017   MyRisk neg; IBIS=22.9%/riskscore=34.6%  . Heart murmur   . Increased risk of breast cancer 01/2017   IBIS=22.9%/riskscore=34.6%    Problem List Patient Active Problem List   Diagnosis Date Noted  . Anxiety 04/03/2017  . LGSIL on Pap smear of cervix 02/03/2017  . Near syncope 03/23/2015  . Temporomandibular joint (TMJ) pain 01/09/2015  . Encounter to establish care 09/19/2014  . Arthralgia 09/19/2014  . Chronic infection of sinus 09/19/2014  . Asthma, moderate persistent 06/28/2014  . Seasonal allergies 06/28/2014  . History of seizures 05/06/2014  . Abdominal pain 08/05/2013  . Edema 08/05/2013  . Family history of ischemic heart disease 08/05/2013  .  PALPITATIONS 08/01/2010    Past Surgical Hx Past Surgical History:  Procedure Laterality Date  . BREAST BIOPSY Right 08/13/2018   PSEUDO-ANGIOMATOUS STROMAL HYPERPLASIA AND FOCAL ASSOCIATED   . DILATION AND CURETTAGE OF UTERUS     EMERGENCY  . ENDOSCOPIC TURBINATE REDUCTION Bilateral 11/30/2014   Procedure: ENDOSCOPIC TURBINATE REDUCTION;  Surgeon: Carloyn Manner, MD;  Location: [place];  Service: ENT;  Laterality: Bilateral;  . FRONTAL SINUS EXPLORATION Left 11/30/2014   Procedure: FRONTAL SINUS EXPLORATION;  Surgeon: Carloyn Manner, MD;  Location: [place];  Service: ENT;  Laterality: Left;  . IMAGE GUIDED SINUS SURGERY N/A 11/30/2014   Procedure: IMAGE GUIDED SINUS SURGERY;  Surgeon: Carloyn Manner, MD;  Location: [place];  Service: ENT;  Laterality: N/A;  . NASAL SINUS SURGERY  11/2014  . SEPTOPLASTY WITH ETHMOIDECTOMY, AND MAXILLARY ANTROSTOMY Bilateral 11/30/2014   Procedure: SEPTOPLASTY WITH ETHMOIDECTOMY, AND MAXILLARY ANTROSTOMY;  Surgeon: Carloyn Manner, MD;  Location: [place];  Service: ENT;  Laterality: Bilateral;  . SPHENOIDECTOMY Bilateral 11/30/2014   Procedure: SPHENOIDECTOMY;  Surgeon: Carloyn Manner, MD;  Location: [place];  Service: ENT;  Laterality: Bilateral;  . TUBAL LIGATION      Medications Prior to Admission medications   Medication Sig Start Date End Date Taking? Authorizing Provider  ALPRAZolam (XANAX) 0.25 MG tablet Take 1 tablet (0.25 mg total) by mouth 3 (three) times daily as needed for anxiety. 03/14/17   Etta Quill, NP  amoxicillin-clavulanate (AUGMENTIN) 875-125 MG tablet Take 1 tablet by mouth 2 (two) times daily. 06/27/17   Deborra Medina  L, MD  benzonatate (TESSALON PERLES) 100 MG capsule Take 1-2 capsules (100-200 mg total) by mouth every 8 (eight) hours as needed for cough. 05/15/17   Withrow, Elyse Jarvis, FNP  doxycycline (VIBRA-TABS) 100 MG tablet Take 1 tablet (100 mg total) by mouth 2  (two) times daily. 05/15/17   Withrow, Elyse Jarvis, FNP  fluconazole (DIFLUCAN) 200 MG tablet Take one tablet today, wait 3 days, take the second tablet 11/26/16   Barnet Glasgow, NP  fluticasone [facility]) 50 MCG/ACT nasal spray Place 2 sprays into both nostrils as needed for allergies or rhinitis.    [provider]  levofloxacin (LEVAQUIN) 500 MG tablet Take 1 tablet (500 mg total) by mouth daily. 06/26/17   Withrow, Elyse Jarvis, FNP  montelukast (SINGULAIR) 10 MG tablet Take 1 tablet (10 mg total) by mouth at bedtime. Patient not taking: Reported on 04/03/2017 07/06/16   Wardell Honour, MD  predniSONE (DELTASONE) 5 MG tablet Take 1 tablet (5 mg total) by mouth as directed. 21 day sterapred taper 05/15/17   Benjamine Mola, FNP  VENTOLIN HFA 108 (90 Base) MCG/ACT inhaler INHALE 2 PUFFS INTO THE LUNGS AS NEEDED. 06/27/17   Nahser, [name], MD    Allergies Bactrim [sulfamethoxazole-trimethoprim] and Amoxicillin  Family Hx Family History  Problem Relation Age of Onset  . Heart murmur Father   . Hypertension Father   . Anxiety disorder Father   . Hyperlipidemia Father   . Heart disease Father        stent   . Coronary artery disease Maternal Grandmother   . Fibromyalgia Maternal Grandmother   . Cancer Maternal Grandmother 22       Melanoma, cervical, ovarian  . Cancer Mother 21       Lung Cancer--question mets from breast  . Diabetes Paternal Grandfather   . Heart disease Paternal Grandfather   . Diabetes Maternal Grandfather   . Hypertension Maternal Grandfather   . Cancer Paternal Grandmother        Stage III lung cancer  . Multiple sclerosis Paternal Grandmother   . Breast cancer Other 49  . Cancer Other        blood; tested positive for breast cancer genetic mutation--? type    Social Hx Social History   Tobacco Use  . Smoking status: Former Smoker    Quit date: 11/13/2011    Years since quitting: 7.8  . Smokeless tobacco: Never Used  Substance Use Topics  . Alcohol  use: Yes    Alcohol/week: 1.0 standard drinks    Types: 1 Glasses of wine per week    Comment: Socially   . Drug use: No     Review of Systems  Constitutional: Negative for fever, chills. Eyes: Negative for visual changes. ENT: Negative for sore throat. Cardiovascular: Negative for chest pain. Respiratory: Positive for shortness of breath, wheezing, cough. Gastrointestinal: Negative for nausea, vomiting.  Genitourinary: Negative for dysuria. Musculoskeletal: Negative for leg swelling. Skin: Negative for rash. Neurological: Negative for headaches.   Physical Exam  Vital Signs: ED Triage Vitals  Enc Vitals Group     BP 09/08/19 1617 121/84     Pulse Rate 09/08/19 1617 (!) 113     Resp 09/08/19 1617 (!) 22     Temp 09/08/19 1617 98.3 F (36.8 C)     Temp Source 09/08/19 1617 Oral     SpO2 09/08/19 1616 96 %     Weight 09/08/19 1617 175 lb (79.4 kg)     Height  09/08/19 1617 '5\' 9"'  (1.753 m)     Head Circumference --      Peak Flow --      Pain Score 09/08/19 1617 5     Pain Loc --      Pain Edu? --      Excl. in [place]? --     Constitutional: Alert and oriented.  Head: Normocephalic. Atraumatic. Eyes: Conjunctivae clear. Sclera anicteric. Nose: No congestion. No rhinorrhea. Mouth/Throat: Wearing mask.  Neck: No stridor.   Cardiovascular: Tachycardic, regular rhythm. Extremities well perfused. Respiratory: RR low to mid 20s. Audible inspiratory and expiratory wheezing throughout all lung fields.  Appears short of breath with long sentences. Gastrointestinal: Soft. Non-tender. Non-distended.  Musculoskeletal: No lower extremity edema. No deformities. Neurologic:  Normal speech and language. No gross focal neurologic deficits are appreciated.  Skin: Skin is warm, dry and intact. No rash noted. Psychiatric: Mood and affect are appropriate for situation.  EKG  Personally reviewed.   Rate: 114 Rhythm: sinus Axis: normal Intervals: WNL Sinus tachycardia No  STEMI    Radiology  CXR: IMPRESSION:  Normal chest. It appears that the patient also at chest x-ray  earlier [facility] at [facility] at 1252 hours which was also  normal.    Procedures  Procedure(s) performed (including critical care):  .Critical Care Performed by: Lilia Pro., MD Authorized by: Lilia Pro., MD   Critical care provider statement:    Critical care time (minutes):  35   Critical care was necessary to treat or prevent imminent or life-threatening deterioration of the following conditions: asthma exacerbation requiring Duoneb x 3, steroids, magnesium.   Critical care was time spent personally by me on the following activities:  Discussions with consultants, evaluation of patient's response to treatment, examination of patient, ordering and performing treatments and interventions, ordering and review of laboratory studies, ordering and review of radiographic studies, pulse oximetry, re-evaluation of patient's condition, obtaining history from patient or surrogate and review of old charts     Initial Impression / Assessment and Plan / ED Course  34 y.o. female who presents to the ED for asthma exacerbation, as above.   Will obtain labs, imaging.  We will plan for DuoNeb's, magnesium, steroids, fluids and close reassessment.  XR without acute findings, no focal consolidations.  Labs without actual derangements.  EKG reveals sinus tachycardia likely in the setting of her nebulizer treatments prior to arrival.  Troponin negative.  Patient feeling improved after DuoNeb's x 3, magnesium, steroids.  Repeat exam markedly improved as well, clear throughout with only a single faint scattered wheeze appreciated.  Patient reports marked subjective improvement in symptoms as well.  As such, will plan for discharge.  She already has a prednisone taper Rx in place.  Advise continuation of this.  Continue prescription for Pepcid in the setting of likely steroid related  gastritis.  Continue azithromycin as prescribed.  Scheduled nebulizers for the next 2 days.  Outpatient follow-up.  Patient agreeable with plan.  Given return precautions.   Final Clinical Impression(s) / ED Diagnosis  Final diagnoses:  Mild intermittent asthma with exacerbation       Note:  This document was prepared using Dragon voice recognition software and may include unintentional dictation errors.   Lilia Pro., MD 09/08/19 2258

## 2019-09-08 NOTE — Discharge Instructions (Signed)
Prednisone taper: - tomorrow, take 50 mg, then the next day 40 mg, then next day 30 mg, next day 20 mg, next day 10 mg, as we discussed  Continue the azithromycin as prescribed.   Continue the famotidine (Pepcid) as prescribed. Follow up with GI, information/referral below.   Use your albuterol nebulizer - 5 mg every 4-6 hours scheduled, while awake, for the next 2 days.   Return to the ER for any new or worsening symptoms.

## 2019-09-23 ENCOUNTER — Other Ambulatory Visit: Payer: Self-pay | Admitting: Gastroenterology

## 2019-09-23 DIAGNOSIS — R1013 Epigastric pain: Secondary | ICD-10-CM

## 2019-10-12 ENCOUNTER — Other Ambulatory Visit: Payer: Self-pay

## 2019-10-12 ENCOUNTER — Ambulatory Visit
Admission: RE | Admit: 2019-10-12 | Discharge: 2019-10-12 | Disposition: A | Discharge status: Home / Self Care | Payer: BC Managed Care – PPO | Source: Ambulatory Visit | Attending: Gastroenterology | Admitting: Gastroenterology

## 2019-10-12 ENCOUNTER — Other Ambulatory Visit: Payer: Self-pay | Admitting: Gastroenterology

## 2019-10-12 DIAGNOSIS — R1013 Epigastric pain: Secondary | ICD-10-CM | POA: Diagnosis not present

## 2019-11-18 ENCOUNTER — Other Ambulatory Visit: Payer: Self-pay | Admitting: Family Medicine

## 2019-11-18 ENCOUNTER — Ambulatory Visit
Admission: RE | Admit: 2019-11-18 | Discharge: 2019-11-18 | Disposition: A | Discharge status: Home / Self Care | Payer: BC Managed Care – PPO | Source: Ambulatory Visit | Attending: Family Medicine | Admitting: Family Medicine

## 2019-11-18 ENCOUNTER — Other Ambulatory Visit
Admission: RE | Admit: 2019-11-18 | Discharge: 2019-11-18 | Disposition: A | Discharge status: Home / Self Care | Payer: BC Managed Care – PPO | Source: Ambulatory Visit | Attending: Family Medicine | Admitting: Family Medicine

## 2019-11-18 ENCOUNTER — Other Ambulatory Visit: Payer: Self-pay

## 2019-11-18 DIAGNOSIS — R1011 Right upper quadrant pain: Secondary | ICD-10-CM

## 2019-11-18 DIAGNOSIS — M25511 Pain in right shoulder: Secondary | ICD-10-CM | POA: Insufficient documentation

## 2019-11-18 DIAGNOSIS — R110 Nausea: Secondary | ICD-10-CM

## 2019-11-18 LAB — FIBRIN DERIVATIVES D-DIMER (ARMC ONLY): Fibrin derivatives D-dimer (ARMC): 255.42 ng/mL (FEU) (ref 0.00–499.00)

## 2019-11-23 ENCOUNTER — Ambulatory Visit: Payer: BC Managed Care – PPO

## 2019-11-24 ENCOUNTER — Other Ambulatory Visit: Payer: Self-pay | Admitting: Family Medicine

## 2019-11-24 ENCOUNTER — Ambulatory Visit
Admission: RE | Admit: 2019-11-24 | Discharge: 2019-11-24 | Disposition: A | Discharge status: Home / Self Care | Payer: BC Managed Care – PPO | Source: Ambulatory Visit | Attending: Family Medicine | Admitting: Family Medicine

## 2019-11-24 ENCOUNTER — Other Ambulatory Visit: Payer: Self-pay

## 2019-11-24 DIAGNOSIS — D72825 Bandemia: Secondary | ICD-10-CM | POA: Insufficient documentation

## 2019-11-24 DIAGNOSIS — R1013 Epigastric pain: Secondary | ICD-10-CM

## 2019-11-24 MED ORDER — IOHEXOL 300 MG/ML  SOLN
100.0000 mL | Freq: Once | INTRAMUSCULAR | Status: AC | PRN
  Administered 2019-11-24: 100 mL via INTRAVENOUS

## 2020-09-22 ENCOUNTER — Other Ambulatory Visit: Payer: Self-pay | Admitting: Otolaryngology

## 2020-09-22 DIAGNOSIS — M542 Cervicalgia: Secondary | ICD-10-CM

## 2020-10-10 ENCOUNTER — Ambulatory Visit: Admission: RE | Admit: 2020-10-10 | Payer: BC Managed Care – PPO | Source: Ambulatory Visit

## 2020-10-10 ENCOUNTER — Ambulatory Visit: Admission: RE | Admit: 2020-10-10 | Payer: BC Managed Care – PPO | Source: Home / Self Care | Admitting: Otolaryngology

## 2021-01-23 ENCOUNTER — Other Ambulatory Visit: Payer: Self-pay | Admitting: Family

## 2021-01-23 DIAGNOSIS — R109 Unspecified abdominal pain: Secondary | ICD-10-CM

## 2021-03-06 ENCOUNTER — Other Ambulatory Visit: Payer: Self-pay | Admitting: Family

## 2021-03-06 DIAGNOSIS — R109 Unspecified abdominal pain: Secondary | ICD-10-CM

## 2021-03-21 ENCOUNTER — Other Ambulatory Visit (HOSPITAL_COMMUNITY): Payer: Self-pay | Admitting: Otolaryngology

## 2021-03-21 ENCOUNTER — Other Ambulatory Visit: Payer: Self-pay | Admitting: Otolaryngology

## 2021-03-21 DIAGNOSIS — R42 Dizziness and giddiness: Secondary | ICD-10-CM

## 2021-03-21 DIAGNOSIS — R519 Headache, unspecified: Secondary | ICD-10-CM

## 2021-03-29 ENCOUNTER — Ambulatory Visit
Admission: RE | Admit: 2021-03-29 | Discharge: 2021-03-29 | Disposition: A | Discharge status: Home / Self Care | Payer: 59 | Source: Ambulatory Visit | Attending: Family | Admitting: Family

## 2021-03-29 ENCOUNTER — Other Ambulatory Visit: Payer: Self-pay

## 2021-03-29 DIAGNOSIS — R109 Unspecified abdominal pain: Secondary | ICD-10-CM | POA: Insufficient documentation

## 2021-04-02 ENCOUNTER — Ambulatory Visit
Admission: RE | Admit: 2021-04-02 | Discharge: 2021-04-02 | Disposition: A | Discharge status: Home / Self Care | Payer: 59 | Source: Ambulatory Visit | Attending: Otolaryngology | Admitting: Otolaryngology

## 2021-04-02 ENCOUNTER — Other Ambulatory Visit: Payer: Self-pay

## 2021-04-02 DIAGNOSIS — R42 Dizziness and giddiness: Secondary | ICD-10-CM | POA: Diagnosis present

## 2021-04-02 DIAGNOSIS — R519 Headache, unspecified: Secondary | ICD-10-CM

## 2021-04-02 MED ORDER — GADOBUTROL 1 MMOL/ML IV SOLN
7.5000 mL | Freq: Once | INTRAVENOUS | Status: AC | PRN
  Administered 2021-04-02: 7.5 mL via INTRAVENOUS

## 2021-04-02 MED ORDER — GADOBUTROL 1 MMOL/ML IV SOLN: 6.0000 mL | Freq: Once | INTRAVENOUS | Status: DC | PRN

## 2021-06-26 DIAGNOSIS — S62306A Unspecified fracture of fifth metacarpal bone, right hand, initial encounter for closed fracture: Secondary | ICD-10-CM | POA: Insufficient documentation

## 2021-06-26 DIAGNOSIS — M79641 Pain in right hand: Secondary | ICD-10-CM | POA: Insufficient documentation

## 2021-08-23 ENCOUNTER — Emergency Department
Admission: EM | Admit: 2021-08-23 | Discharge: 2021-08-24 | Disposition: A | Discharge status: Home / Self Care | Payer: BC Managed Care – PPO | Attending: Emergency Medicine | Admitting: Emergency Medicine

## 2021-08-23 ENCOUNTER — Emergency Department: Payer: BC Managed Care – PPO

## 2021-08-23 ENCOUNTER — Other Ambulatory Visit: Payer: Self-pay

## 2021-08-23 DIAGNOSIS — R1030 Lower abdominal pain, unspecified: Secondary | ICD-10-CM | POA: Diagnosis present

## 2021-08-23 DIAGNOSIS — R42 Dizziness and giddiness: Secondary | ICD-10-CM

## 2021-08-23 DIAGNOSIS — R5383 Other fatigue: Secondary | ICD-10-CM

## 2021-08-23 DIAGNOSIS — Z20822 Contact with and (suspected) exposure to covid-19: Secondary | ICD-10-CM | POA: Insufficient documentation

## 2021-08-23 DIAGNOSIS — R110 Nausea: Secondary | ICD-10-CM | POA: Diagnosis not present

## 2021-08-23 DIAGNOSIS — R109 Unspecified abdominal pain: Secondary | ICD-10-CM

## 2021-08-23 LAB — CBC WITH DIFFERENTIAL/PLATELET
Abs Immature Granulocytes: 0.02 10*3/uL (ref 0.00–0.07)
Basophils Absolute: 0.1 10*3/uL (ref 0.0–0.1)
Basophils Relative: 1 %
Eosinophils Absolute: 0.2 10*3/uL (ref 0.0–0.5)
Eosinophils Relative: 3 %
HCT: 38.1 % (ref 36.0–46.0)
Hemoglobin: 12.8 g/dL (ref 12.0–15.0)
Immature Granulocytes: 0 %
Lymphocytes Relative: 24 %
Lymphs Abs: 1.4 10*3/uL (ref 0.7–4.0)
MCH: 30.7 pg (ref 26.0–34.0)
MCHC: 33.6 g/dL (ref 30.0–36.0)
MCV: 91.4 fL (ref 80.0–100.0)
Monocytes Absolute: 0.8 10*3/uL (ref 0.1–1.0)
Monocytes Relative: 14 %
Neutro Abs: 3.3 10*3/uL (ref 1.7–7.7)
Neutrophils Relative %: 58 %
Platelets: 249 10*3/uL (ref 150–400)
RBC: 4.17 MIL/uL (ref 3.87–5.11)
RDW: 12.2 % (ref 11.5–15.5)
WBC: 5.8 10*3/uL (ref 4.0–10.5)
nRBC: 0 % (ref 0.0–0.2)

## 2021-08-23 LAB — URINALYSIS, ROUTINE W REFLEX MICROSCOPIC
Bilirubin Urine: NEGATIVE
Glucose, UA: NEGATIVE mg/dL
Hgb urine dipstick: NEGATIVE
Ketones, ur: NEGATIVE mg/dL
Leukocytes,Ua: NEGATIVE
Nitrite: NEGATIVE
Protein, ur: NEGATIVE mg/dL
Specific Gravity, Urine: 1.028 (ref 1.005–1.030)
pH: 5 (ref 5.0–8.0)

## 2021-08-23 LAB — WET PREP, GENITAL
Clue Cells Wet Prep HPF POC: NONE SEEN
Sperm: NONE SEEN
Trich, Wet Prep: NONE SEEN
WBC, Wet Prep HPF POC: <10 — AB
Yeast Wet Prep HPF POC: NONE SEEN

## 2021-08-23 LAB — COMPREHENSIVE METABOLIC PANEL
ALT: 23 U/L (ref 0–44)
AST: 23 U/L (ref 15–41)
Albumin: 4.2 g/dL (ref 3.5–5.0)
Alkaline Phosphatase: 55 U/L (ref 38–126)
Anion gap: 6 (ref 5–15)
BUN: 11 mg/dL (ref 6–20)
CO2: 26 mmol/L (ref 22–32)
Calcium: 8.8 mg/dL — ABNORMAL LOW (ref 8.9–10.3)
Chloride: 104 mmol/L (ref 98–111)
Creatinine, Ser: 0.79 mg/dL (ref 0.44–1.00)
GFR, Estimated: >60 mL/min (ref >60)
Glucose, Bld: 81 mg/dL (ref 70–99)
Potassium: 3.1 mmol/L — ABNORMAL LOW (ref 3.5–5.1)
Sodium: 136 mmol/L (ref 135–145)
Total Bilirubin: 0.3 mg/dL (ref 0.3–1.2)
Total Protein: 7 g/dL (ref 6.5–8.1)

## 2021-08-23 LAB — MAGNESIUM: Magnesium: 2.1 mg/dL (ref 1.7–2.4)

## 2021-08-23 LAB — POC URINE PREG, ED: Preg Test, Ur: NEGATIVE

## 2021-08-23 LAB — LIPASE, BLOOD: Lipase: 39 U/L (ref 11–51)

## 2021-08-23 MED ORDER — POTASSIUM CHLORIDE CRYS ER 20 MEQ PO TBCR
40.0000 meq | EXTENDED_RELEASE_TABLET | Freq: Once | ORAL | Status: AC
  Administered 2021-08-23: 40 meq via ORAL
  Filled 2021-08-23: qty 2

## 2021-08-23 MED ORDER — IOHEXOL 300 MG/ML  SOLN
100.0000 mL | Freq: Once | INTRAMUSCULAR | Status: AC | PRN
  Administered 2021-08-23: 100 mL via INTRAVENOUS

## 2021-08-23 NOTE — ED Provider Notes (Signed)
[facility] Provider Note    Event Date/Time   First MD Initiated Contact with Patient 08/23/21 2154     (approximate)   History   Abdominal Pain   HPI  Ebony Lewis is a 36 y.o. female who reports being recently diagnosed with PID who comes in with concerns for worsening abdominal pain.  Patient reports that when she had gotten the PID treatment her symptoms had gotten better.  She had had some greenish discharge and a lot of lower abdominal pelvic pain and when they did the speculum exam she report a lot of cervical motion tenderness.  However her STD testing were all negative and she denies any new sexual contact they still treated her as if it could be PID given her exam.  She reports that her symptoms got mostly better but then 4 days ago she started to have return of the symptoms with lower abdominal pain as well as some mild amount of discharge.  She also reports some mild nausea.  She went to an urgent care who told her to come to the ER to also make sure that it was not appendicitis.  On review of records from OB/GYN patient was started on doxycycline 100 twice daily and Flagyl 500 twice daily for 14 days. I reviewed the records from 1/18 does not appear that there was any gonorrhea chlamydia that was detected.  They did an ultrasound that showed a right collapsed follicle cyst  Physical Exam   Triage Vital Signs: ED Triage Vitals  Enc Vitals Group     BP 08/23/21 2029 129/82     Pulse Rate 08/23/21 2029 76     Resp 08/23/21 2029 15     Temp 08/23/21 2029 99 F (37.2 C)     Temp Source 08/23/21 2029 Oral     SpO2 08/23/21 2029 98 %     Weight 08/23/21 2031 160 lb (72.6 kg)     Height 08/23/21 2031 5' 8.5" (1.74 m)     Head Circumference --      Peak Flow --      Pain Score 08/23/21 2029 6     Pain Loc --      Pain Edu? --      Excl. in [place]? --     Most recent vital signs: Vitals:   08/23/21 2029  BP: 129/82  Pulse: 76  Resp: 15   Temp: 99 F (37.2 C)  SpO2: 98%     General: Awake, no distress.  CV:  Good peripheral perfusion.  Resp:  Normal effort.  Abd:  No distention.  Slightly tender in the lower abdomen Other:  NO Cervical motion tenderness or adnexal tenderness some white discharge noted   ED Results / Procedures / Treatments   Labs (all labs ordered are listed, but only abnormal results are displayed) Labs Reviewed  URINALYSIS, ROUTINE W REFLEX MICROSCOPIC - Abnormal; Notable for the following components:      Result Value   Color, Urine YELLOW (*)    APPearance CLEAR (*)    All other components within normal limits  COMPREHENSIVE METABOLIC PANEL - Abnormal; Notable for the following components:   Potassium 3.1 (*)    Calcium 8.8 (*)    All other components within normal limits  WET PREP, GENITAL  CHLAMYDIA/NGC RT PCR ([facility] ONLY)            CBC WITH DIFFERENTIAL/PLATELET  LIPASE, BLOOD  MAGNESIUM  POC URINE PREG, ED  RADIOLOGY Pending   PROCEDURES:  Critical Care performed: No  Procedures   MEDICATIONS ORDERED IN ED: Medications - No data to display   IMPRESSION / MDM / [place] / ED COURSE  I reviewed the triage vital signs and the nursing notes.                              Patient was recently treated PID who comes in with return of lower abdominal pain and some vaginal discharge.  Pelvic exam was overall reassuring but will get ultrasound to make sure no evidence of abscess, torsion as well as CT to make sure no evidence of appendicitis.  We will also send COVID, flu test  UA without evidence of UTI Pregnancy test was negative CBC was overall grossly normal CMP slightly low potassium we will give some oral repletion  Patient will be handed off to oncoming team pending these results    FINAL CLINICAL IMPRESSION(S) / ED DIAGNOSES   Final diagnoses:  Abdominal pain     Rx / DC Orders   ED Discharge Orders     None        Note:  This  document was prepared using Dragon voice recognition software and may include unintentional dictation errors.   Vanessa Wheatland, MD 08/23/21 [phone]

## 2021-08-23 NOTE — ED Provider Notes (Signed)
11:30 PM  Assumed care at shift change.  Patient is a 36 year old female who presents to the emergency department with abdominal pain.  Recently treated as an outpatient for PID.  Labs, urine unremarkable.  Pregnancy test negative.  Pelvic cultures, pelvic ultrasound, CT abdomen pelvis pending.   11:37 PM  CT AP reviewed by myself and radiology shows no acute abnormality.  Appendix appears normal.  Wet prep unremarkable.  Ultrasound, GC chlamydia pending.   11:51 PM  Pt's transvaginal ultrasound reviewed by myself and radiologist is unremarkable.  Normal blood flow to both ovaries.   1:17 AM  Pt's COVID and flu swabs were negative.  On reevaluation she complains of having intermittent nausea, headache, dizziness.  Had offered her pain medication, nausea medicine, IV fluids, meclizine multiple times but she declined stating she would like to go home.  She states that she did not have significant tenderness on her pelvic exam and given reassuring work-up today my suspicion for PID is low.  She is sexually active with her husband only.  She finished a full 2 weeks of Flagyl, doxycycline and had IM Rocephin.  I do not feel she needs repeat antibiotics.  I have encouraged her to follow-up closely with her OB/GYN as endometriosis is also in the differential.  She does have a strong family history of this.  She also reports she has been having fatigue and was told that her vitamin E level was low.  Have encouraged her to follow-up with her primary care doctor if symptoms continue or are not improving.  She was given a prescription for Zofran for her nausea by urgent care which I have encouraged her to take.  At this time, I do not feel there is any life-threatening condition present. I reviewed all nursing notes, vitals, pertinent previous records.  All lab and urine results, EKGs, imaging ordered have been independently reviewed and interpreted by myself.  I reviewed all available radiology reports from any  imaging ordered this visit.  Based on my assessment, I feel the patient is safe to be discharged home without further emergent workup and can continue workup as an outpatient as needed. Discussed all findings, treatment plan as well as usual and customary return precautions with patient.  They verbalize understanding and are comfortable with this plan.  Outpatient follow-up has been provided as needed.  All questions have been answered.    Lariah Fleer, Delice Bison, DO 08/24/21 0121

## 2021-08-23 NOTE — ED Triage Notes (Addendum)
Pt presents via POV with complaints of abdominal pain - she has been treated for PID but has had no improvement. She endorses a flushed sensation, nausea, vomiting, and was sent here by UC. Pt describes the right lower quadrant pain that radiates towards her back. Denies CP.

## 2021-08-24 LAB — CHLAMYDIA/NGC RT PCR (ARMC ONLY)
Chlamydia Tr: NOT DETECTED
N gonorrhoeae: NOT DETECTED

## 2021-08-24 LAB — RESP PANEL BY RT-PCR (FLU A&B, COVID) ARPGX2
Influenza A by PCR: NEGATIVE
Influenza B by PCR: NEGATIVE
SARS Coronavirus 2 by RT PCR: NEGATIVE

## 2021-08-24 NOTE — Discharge Instructions (Addendum)
I recommend close follow-up with your primary care provider and OB/GYN.  Your work-up here today was reassuring.  Your COVID and flu swabs are negative today.   You may alternate Tylenol 1000 mg every 6 hours as needed for pain, fever and Ibuprofen 800 mg every 6-8 hours as needed for pain, fever.  Please take Ibuprofen with food.  Do not take more than 4000 mg of Tylenol (acetaminophen) in a 24 hour period.   Please use your Zofran as needed for nausea.

## 2022-02-07 ENCOUNTER — Other Ambulatory Visit: Payer: Self-pay | Admitting: Obstetrics and Gynecology

## 2022-02-07 DIAGNOSIS — R102 Pelvic and perineal pain: Secondary | ICD-10-CM

## 2022-02-07 DIAGNOSIS — N730 Acute parametritis and pelvic cellulitis: Secondary | ICD-10-CM

## 2022-02-22 ENCOUNTER — Ambulatory Visit: Admission: RE | Admit: 2022-02-22 | Payer: BC Managed Care – PPO | Source: Ambulatory Visit

## 2022-03-01 ENCOUNTER — Ambulatory Visit
Admission: RE | Admit: 2022-03-01 | Discharge: 2022-03-01 | Disposition: A | Discharge status: Home / Self Care | Payer: BC Managed Care – PPO | Source: Ambulatory Visit | Attending: Obstetrics and Gynecology | Admitting: Obstetrics and Gynecology

## 2022-03-01 DIAGNOSIS — R102 Pelvic and perineal pain: Secondary | ICD-10-CM | POA: Insufficient documentation

## 2022-03-01 DIAGNOSIS — N730 Acute parametritis and pelvic cellulitis: Secondary | ICD-10-CM | POA: Insufficient documentation

## 2022-03-01 MED ORDER — IOHEXOL 300 MG/ML  SOLN
80.0000 mL | Freq: Once | INTRAMUSCULAR | Status: AC | PRN
  Administered 2022-03-01: 80 mL via INTRAVENOUS

## 2022-03-12 ENCOUNTER — Other Ambulatory Visit: Payer: Self-pay | Admitting: Obstetrics and Gynecology

## 2022-04-30 ENCOUNTER — Encounter
Admission: RE | Admit: 2022-04-30 | Discharge: 2022-04-30 | Disposition: A | Discharge status: Home / Self Care | Payer: BC Managed Care – PPO | Source: Ambulatory Visit | Attending: Obstetrics and Gynecology | Admitting: Obstetrics and Gynecology

## 2022-04-30 ENCOUNTER — Other Ambulatory Visit: Payer: Self-pay

## 2022-04-30 DIAGNOSIS — Z01812 Encounter for preprocedural laboratory examination: Secondary | ICD-10-CM

## 2022-04-30 HISTORY — DX: Pneumonia, unspecified organism: J18.9

## 2022-04-30 HISTORY — DX: Cyst of kidney, acquired: N28.1

## 2022-04-30 HISTORY — DX: Anemia, unspecified: D64.9

## 2022-04-30 NOTE — Patient Instructions (Addendum)
Your procedure is scheduled on: 05/08/22 - Wednesday Report to the Registration Desk on the 1st floor of the [facility]. To find out your arrival time, please call [phone] between 1PM - 3PM on: [birth year] - Tuesday If your arrival time is 6:00 am, do not arrive prior to that time as the [facility] entrance doors do not open until 6:00 am.  REMEMBER: Instructions that are not followed completely may result in serious medical risk, up to and including death; or upon the discretion of your surgeon and anesthesiologist your surgery may need to be rescheduled.  Do not eat food after midnight the night before surgery.  No gum chewing, lozengers or hard candies.  You may however, drink CLEAR liquids up to 2 hours before you are scheduled to arrive for your surgery. Do not drink anything within 2 hours of your scheduled arrival time.  Clear liquids include: - water  - apple juice without pulp - gatorade (not RED colors) - black coffee or tea (Do NOT add milk or creamers to the coffee or tea) Do NOT drink anything that is not on this list.  TAKE THESE MEDICATIONS THE MORNING OF SURGERY WITH A SIP OF WATER: NONE  Use VENTOLIN HFA  on the day of surgery and bring to the hospital.  One week prior to surgery: Stop Anti-inflammatories (NSAIDS) such as Advil, Aleve, Ibuprofen, Motrin, Naproxen, Naprosyn and Aspirin based products such as Excedrin, Goodys Powder, BC Powder.  Stop ANY OVER THE COUNTER supplements until after surgery.  You may however, continue to take Tylenol if needed for pain up until the day of surgery.  No Alcohol for 24 hours before or after surgery.  No Smoking including e-cigarettes for 24 hours prior to surgery.  No chewable tobacco products for at least 6 hours prior to surgery.  No nicotine patches on the day of surgery.  Do not use any "recreational" drugs for at least a week prior to your surgery.  Please be advised that the combination of cocaine and  anesthesia may have negative outcomes, up to and including death. If you test positive for cocaine, your surgery will be cancelled.  On the morning of surgery brush your teeth with toothpaste and water, you may rinse your mouth with mouthwash if you wish. Do not swallow any toothpaste or mouthwash.  Use CHG Soap or wipes as directed on instruction sheet.  Do not wear jewelry, make-up, hairpins, clips or nail polish.  Do not wear lotions, powders, or perfumes.   Do not shave body from the neck down 48 hours prior to surgery just in case you cut yourself which could leave a site for infection.  Also, freshly shaved skin may become irritated if using the CHG soap.  Contact lenses, hearing aids and dentures may not be worn into surgery.  Do not bring valuables to the hospital. [facility] is not responsible for any missing/lost belongings or valuables.   Notify your doctor if there is any change in your medical condition (cold, fever, infection).  Wear comfortable clothing (specific to your surgery type) to the hospital.  After surgery, you can help prevent lung complications by doing breathing exercises.  Take deep breaths and cough every 1-2 hours. Your doctor may order a device called an Incentive Spirometer to help you take deep breaths. When coughing or sneezing, hold a pillow firmly against your incision with both hands. This is called "splinting." Doing this helps protect your incision. It also decreases belly discomfort.  If you are being admitted to the hospital overnight, leave your suitcase in the car. After surgery it may be brought to your room.  If you are being discharged the day of surgery, you will not be allowed to drive home. You will need a responsible adult (18 years or older) to drive you home and stay with you that night.   If you are taking public transportation, you will need to have a responsible adult (18 years or older) with you. Please confirm with your  physician that it is acceptable to use public transportation.   Please call the [place] Dept. at [phone] if you have any questions about these instructions.  Surgery Visitation Policy:  Patients undergoing a surgery or procedure may have two family members or support persons with them as long as the person is not COVID-19 positive or experiencing its symptoms.   Inpatient Visitation:    Visiting hours are 7 a.m. to 8 p.m. Up to four visitors are allowed at one time in a patient room, including children. The visitors may rotate out with other people during the day. One designated support person (adult) may remain overnight.

## 2022-05-08 ENCOUNTER — Ambulatory Visit: Payer: BC Managed Care – PPO | Admitting: Anesthesiology

## 2022-05-08 ENCOUNTER — Encounter: Payer: Self-pay | Admitting: Obstetrics and Gynecology

## 2022-05-08 ENCOUNTER — Encounter
Admission: RE | Disposition: A | Discharge status: Home / Self Care | Payer: Self-pay | Source: Home / Self Care | Attending: Obstetrics and Gynecology

## 2022-05-08 ENCOUNTER — Other Ambulatory Visit: Payer: Self-pay

## 2022-05-08 ENCOUNTER — Ambulatory Visit
Admission: RE | Admit: 2022-05-08 | Discharge: 2022-05-08 | Disposition: A | Discharge status: Home / Self Care | Payer: BC Managed Care – PPO | Attending: Obstetrics and Gynecology | Admitting: Obstetrics and Gynecology

## 2022-05-08 DIAGNOSIS — J45909 Unspecified asthma, uncomplicated: Secondary | ICD-10-CM | POA: Insufficient documentation

## 2022-05-08 DIAGNOSIS — G8929 Other chronic pain: Secondary | ICD-10-CM | POA: Diagnosis not present

## 2022-05-08 DIAGNOSIS — Z87891 Personal history of nicotine dependence: Secondary | ICD-10-CM | POA: Diagnosis not present

## 2022-05-08 DIAGNOSIS — N808 Other endometriosis: Secondary | ICD-10-CM | POA: Insufficient documentation

## 2022-05-08 DIAGNOSIS — R102 Pelvic and perineal pain: Secondary | ICD-10-CM | POA: Insufficient documentation

## 2022-05-08 DIAGNOSIS — R3989 Other symptoms and signs involving the genitourinary system: Secondary | ICD-10-CM | POA: Diagnosis not present

## 2022-05-08 DIAGNOSIS — Z01812 Encounter for preprocedural laboratory examination: Secondary | ICD-10-CM

## 2022-05-08 DIAGNOSIS — N711 Chronic inflammatory disease of uterus: Secondary | ICD-10-CM | POA: Insufficient documentation

## 2022-05-08 DIAGNOSIS — N838 Other noninflammatory disorders of ovary, fallopian tube and broad ligament: Secondary | ICD-10-CM | POA: Insufficient documentation

## 2022-05-08 DIAGNOSIS — N898 Other specified noninflammatory disorders of vagina: Secondary | ICD-10-CM

## 2022-05-08 HISTORY — PX: XI ROBOT ASSISTED DIAGNOSTIC LAPAROSCOPY: SHX6815

## 2022-05-08 HISTORY — PX: HYSTEROSCOPY WITH D & C: SHX1775

## 2022-05-08 LAB — POCT PREGNANCY, URINE: Preg Test, Ur: NEGATIVE

## 2022-05-08 LAB — TYPE AND SCREEN
ABO/RH(D): O POS
Antibody Screen: NEGATIVE

## 2022-05-08 LAB — ABO/RH: ABO/RH(D): O POS

## 2022-05-08 SURGERY — LAPAROSCOPY, DIAGNOSTIC, ROBOT-ASSISTED
Anesthesia: General

## 2022-05-08 MED ORDER — ONDANSETRON HCL 4 MG/2ML IJ SOLN
INTRAMUSCULAR | Status: AC
  Filled 2022-05-08: qty 2

## 2022-05-08 MED ORDER — 0.9 % SODIUM CHLORIDE (POUR BTL) OPTIME
TOPICAL | Status: DC | PRN
  Administered 2022-05-08: 500 mL

## 2022-05-08 MED ORDER — DEXMEDETOMIDINE HCL IN NACL 200 MCG/50ML IV SOLN
INTRAVENOUS | Status: DC | PRN
  Administered 2022-05-08 (×2): 4 ug via INTRAVENOUS

## 2022-05-08 MED ORDER — MIDAZOLAM HCL 2 MG/2ML IJ SOLN
INTRAMUSCULAR | Status: DC | PRN
  Administered 2022-05-08: 2 mg via INTRAVENOUS

## 2022-05-08 MED ORDER — PHENYLEPHRINE HCL-NACL 20-0.9 MG/250ML-% IV SOLN
INTRAVENOUS | Status: AC
  Filled 2022-05-08: qty 250

## 2022-05-08 MED ORDER — ONDANSETRON HCL 4 MG/2ML IJ SOLN
INTRAMUSCULAR | Status: DC | PRN
  Administered 2022-05-08: 4 mg via INTRAVENOUS

## 2022-05-08 MED ORDER — FENTANYL CITRATE (PF) 100 MCG/2ML IJ SOLN
INTRAMUSCULAR | Status: DC | PRN
  Administered 2022-05-08 (×2): 50 ug via INTRAVENOUS

## 2022-05-08 MED ORDER — FAMOTIDINE 20 MG PO TABS: 20.0000 mg | ORAL_TABLET | Freq: Once | ORAL | Status: AC

## 2022-05-08 MED ORDER — POVIDONE-IODINE 10 % EX SWAB
2.0000 | Freq: Once | CUTANEOUS | Status: AC
  Administered 2022-05-08: 2 via TOPICAL

## 2022-05-08 MED ORDER — HYDROMORPHONE HCL 1 MG/ML IJ SOLN
INTRAMUSCULAR | Status: AC
  Filled 2022-05-08: qty 1

## 2022-05-08 MED ORDER — CHLORHEXIDINE GLUCONATE 0.12 % MT SOLN: 15.0000 mL | Freq: Once | OROMUCOSAL | Status: AC

## 2022-05-08 MED ORDER — ROCURONIUM BROMIDE 100 MG/10ML IV SOLN
INTRAVENOUS | Status: DC | PRN
  Administered 2022-05-08: 60 mg via INTRAVENOUS
  Administered 2022-05-08 (×2): 10 mg via INTRAVENOUS

## 2022-05-08 MED ORDER — ROCURONIUM BROMIDE 10 MG/ML (PF) SYRINGE
PREFILLED_SYRINGE | INTRAVENOUS | Status: AC
  Filled 2022-05-08: qty 10

## 2022-05-08 MED ORDER — SILVER NITRATE-POT NITRATE 75-25 % EX MISC
CUTANEOUS | Status: AC
  Filled 2022-05-08: qty 10

## 2022-05-08 MED ORDER — CHLORHEXIDINE GLUCONATE 0.12 % MT SOLN
OROMUCOSAL | Status: AC
  Administered 2022-05-08: 15 mL via OROMUCOSAL
  Filled 2022-05-08: qty 15

## 2022-05-08 MED ORDER — PHENYLEPHRINE 80 MCG/ML (10ML) SYRINGE FOR IV PUSH (FOR BLOOD PRESSURE SUPPORT)
PREFILLED_SYRINGE | INTRAVENOUS | Status: AC
  Filled 2022-05-08: qty 10

## 2022-05-08 MED ORDER — FENTANYL CITRATE (PF) 100 MCG/2ML IJ SOLN
INTRAMUSCULAR | Status: AC
  Filled 2022-05-08: qty 2

## 2022-05-08 MED ORDER — ACETAMINOPHEN 10 MG/ML IV SOLN
INTRAVENOUS | Status: DC | PRN
  Administered 2022-05-08: 1000 mg via INTRAVENOUS

## 2022-05-08 MED ORDER — OXYCODONE-ACETAMINOPHEN 5-325 MG PO TABS: 1.0000 | ORAL_TABLET | Freq: Four times a day (QID) | ORAL | 0 refills | Status: DC | PRN

## 2022-05-08 MED ORDER — HYDROMORPHONE HCL 1 MG/ML IJ SOLN
0.2500 mg | INTRAMUSCULAR | Status: DC | PRN
  Administered 2022-05-08 (×2): 0.5 mg via INTRAVENOUS

## 2022-05-08 MED ORDER — OXYCODONE HCL 5 MG/5ML PO SOLN: 5.0000 mg | Freq: Once | ORAL | Status: AC | PRN

## 2022-05-08 MED ORDER — PROMETHAZINE HCL 25 MG/ML IJ SOLN: 6.2500 mg | Freq: Once | INTRAMUSCULAR | Status: DC | PRN

## 2022-05-08 MED ORDER — PHENYLEPHRINE HCL (PRESSORS) 10 MG/ML IV SOLN
INTRAVENOUS | Status: DC | PRN
  Administered 2022-05-08 (×4): 160 ug via INTRAVENOUS
  Administered 2022-05-08: 80 ug via INTRAVENOUS

## 2022-05-08 MED ORDER — SUGAMMADEX SODIUM 200 MG/2ML IV SOLN
INTRAVENOUS | Status: DC | PRN
  Administered 2022-05-08: 200 mg via INTRAVENOUS

## 2022-05-08 MED ORDER — DEXAMETHASONE SODIUM PHOSPHATE 10 MG/ML IJ SOLN
INTRAMUSCULAR | Status: AC
  Filled 2022-05-08: qty 1

## 2022-05-08 MED ORDER — LIDOCAINE HCL (CARDIAC) PF 100 MG/5ML IV SOSY
PREFILLED_SYRINGE | INTRAVENOUS | Status: DC | PRN
  Administered 2022-05-08: 60 mg via INTRAVENOUS

## 2022-05-08 MED ORDER — BUPIVACAINE HCL (PF) 0.5 % IJ SOLN
INTRAMUSCULAR | Status: AC
  Filled 2022-05-08: qty 30

## 2022-05-08 MED ORDER — PROPOFOL 10 MG/ML IV BOLUS
INTRAVENOUS | Status: AC
  Filled 2022-05-08: qty 40

## 2022-05-08 MED ORDER — ONDANSETRON HCL 4 MG/2ML IJ SOLN
4.0000 mg | Freq: Once | INTRAMUSCULAR | Status: AC
  Administered 2022-05-08: 4 mg via INTRAVENOUS

## 2022-05-08 MED ORDER — OXYCODONE HCL 5 MG PO TABS
ORAL_TABLET | ORAL | Status: AC
  Filled 2022-05-08: qty 1

## 2022-05-08 MED ORDER — HYDROCODONE-ACETAMINOPHEN 5-325 MG PO TABS: 1.0000 | ORAL_TABLET | ORAL | 0 refills | Status: DC | PRN

## 2022-05-08 MED ORDER — SILVER NITRATE-POT NITRATE 75-25 % EX MISC
CUTANEOUS | Status: DC | PRN
  Administered 2022-05-08: 3

## 2022-05-08 MED ORDER — ORAL CARE MOUTH RINSE: 15.0000 mL | Freq: Once | OROMUCOSAL | Status: AC

## 2022-05-08 MED ORDER — BUPIVACAINE HCL 0.5 % IJ SOLN
INTRAMUSCULAR | Status: DC | PRN
  Administered 2022-05-08: 15 mL

## 2022-05-08 MED ORDER — LACTATED RINGERS IV SOLN: INTRAVENOUS | Status: DC

## 2022-05-08 MED ORDER — LIDOCAINE HCL (PF) 2 % IJ SOLN
INTRAMUSCULAR | Status: AC
  Filled 2022-05-08: qty 5

## 2022-05-08 MED ORDER — IBUPROFEN 600 MG PO TABS: 600.0000 mg | ORAL_TABLET | Freq: Four times a day (QID) | ORAL | 0 refills | Status: DC

## 2022-05-08 MED ORDER — FAMOTIDINE 20 MG PO TABS
ORAL_TABLET | ORAL | Status: AC
  Administered 2022-05-08: 20 mg via ORAL
  Filled 2022-05-08: qty 1

## 2022-05-08 MED ORDER — SODIUM CHLORIDE 0.9 % IR SOLN
Status: DC | PRN
  Administered 2022-05-08: 575 mL

## 2022-05-08 MED ORDER — ACETAMINOPHEN 10 MG/ML IV SOLN
INTRAVENOUS | Status: AC
  Filled 2022-05-08: qty 100

## 2022-05-08 MED ORDER — KETOROLAC TROMETHAMINE 30 MG/ML IJ SOLN
INTRAMUSCULAR | Status: DC | PRN
  Administered 2022-05-08: 30 mg via INTRAVENOUS

## 2022-05-08 MED ORDER — HYDROMORPHONE HCL 1 MG/ML IJ SOLN
INTRAMUSCULAR | Status: AC
  Administered 2022-05-08: 0.5 mg via INTRAVENOUS
  Filled 2022-05-08: qty 1

## 2022-05-08 MED ORDER — PROPOFOL 10 MG/ML IV BOLUS
INTRAVENOUS | Status: DC | PRN
  Administered 2022-05-08: 140 mg via INTRAVENOUS

## 2022-05-08 MED ORDER — KETOROLAC TROMETHAMINE 30 MG/ML IJ SOLN
INTRAMUSCULAR | Status: AC
  Filled 2022-05-08: qty 1

## 2022-05-08 MED ORDER — OXYCODONE HCL 5 MG PO TABS
5.0000 mg | ORAL_TABLET | Freq: Once | ORAL | Status: AC | PRN
  Administered 2022-05-08: 5 mg via ORAL

## 2022-05-08 MED ORDER — MIDAZOLAM HCL 2 MG/2ML IJ SOLN
INTRAMUSCULAR | Status: AC
  Filled 2022-05-08: qty 2

## 2022-05-08 MED ORDER — PHENYLEPHRINE HCL-NACL 20-0.9 MG/250ML-% IV SOLN
INTRAVENOUS | Status: DC | PRN
  Administered 2022-05-08: 20 ug/min via INTRAVENOUS

## 2022-05-08 MED ORDER — DEXAMETHASONE SODIUM PHOSPHATE 10 MG/ML IJ SOLN
INTRAMUSCULAR | Status: DC | PRN
  Administered 2022-05-08: 10 mg via INTRAVENOUS

## 2022-05-08 SURGICAL SUPPLY — 59 items
BAG URINE DRAIN 2000ML AR STRL (UROLOGICAL SUPPLIES) ×1 IMPLANT
BARRIER ADHS 3X4 INTERCEED (GAUZE/BANDAGES/DRESSINGS) IMPLANT
BLADE SURG SZ11 CARB STEEL (BLADE) ×1 IMPLANT
CATH FOLEY 2WAY  5CC 16FR (CATHETERS) ×1
CATH URTH 16FR FL 2W BLN LF (CATHETERS) ×1 IMPLANT
COVER TIP SHEARS 8 DVNC (MISCELLANEOUS) ×1 IMPLANT
COVER TIP SHEARS 8MM DA VINCI (MISCELLANEOUS) ×1
DERMABOND ADVANCED .7 DNX12 (GAUZE/BANDAGES/DRESSINGS) ×1 IMPLANT
DRAPE 3/4 80X56 (DRAPES) ×1 IMPLANT
DRAPE ARM DVNC X/XI (DISPOSABLE) ×3 IMPLANT
DRAPE COLUMN DVNC XI (DISPOSABLE) ×1 IMPLANT
DRAPE DA VINCI XI ARM (DISPOSABLE) ×4
DRAPE DA VINCI XI COLUMN (DISPOSABLE) ×1
DRAPE UNDER BUTTOCK W/FLU (DRAPES) ×1 IMPLANT
DRSG TELFA 3X8 NADH STRL (GAUZE/BANDAGES/DRESSINGS) IMPLANT
ELECT REM PT RETURN 9FT ADLT (ELECTROSURGICAL) ×1
ELECTRODE REM PT RTRN 9FT ADLT (ELECTROSURGICAL) ×1 IMPLANT
GLOVE BIO SURGEON STRL SZ7 (GLOVE) ×2 IMPLANT
GLOVE BIOGEL PI IND STRL 7.5 (GLOVE) ×1 IMPLANT
GLOVE SURG UNDER POLY LF SZ7.5 (GLOVE) ×3 IMPLANT
GOWN STRL REUS W/ TWL LRG LVL3 (GOWN DISPOSABLE) ×3 IMPLANT
GOWN STRL REUS W/TWL LRG LVL3 (GOWN DISPOSABLE) ×7
IRRIGATION STRYKERFLOW (MISCELLANEOUS) IMPLANT
IRRIGATOR STRYKERFLOW (MISCELLANEOUS) ×1
IV NS 1000ML (IV SOLUTION) ×1
IV NS 1000ML BAXH (IV SOLUTION) ×1 IMPLANT
IV NS IRRIG 3000ML ARTHROMATIC (IV SOLUTION) ×1 IMPLANT
KIT PINK PAD W/HEAD ARE REST (MISCELLANEOUS) ×1
KIT PINK PAD W/HEAD ARM REST (MISCELLANEOUS) ×1 IMPLANT
KIT PROCEDURE FLUENT (KITS) ×1 IMPLANT
LABEL OR SOLS (LABEL) ×1 IMPLANT
MANIFOLD NEPTUNE II (INSTRUMENTS) ×1 IMPLANT
NEEDLE HYPO 22GX1.5 SAFETY (NEEDLE) ×1 IMPLANT
NS IRRIG 1000ML POUR BTL (IV SOLUTION) ×2 IMPLANT
OBTURATOR OPTICAL STANDARD 8MM (TROCAR) ×1
OBTURATOR OPTICAL STND 8 DVNC (TROCAR) ×1
OBTURATOR OPTICALSTD 8 DVNC (TROCAR) ×1 IMPLANT
PACK DNC HYST (MISCELLANEOUS) ×1 IMPLANT
PACK LAP CHOLECYSTECTOMY (MISCELLANEOUS) ×1 IMPLANT
PAD OB MATERNITY 4.3X12.25 (PERSONAL CARE ITEMS) ×1 IMPLANT
PAD PREP 24X41 OB/GYN DISP (PERSONAL CARE ITEMS) ×1 IMPLANT
SCRUB CHG 4% DYNA-HEX 4OZ (MISCELLANEOUS) ×1 IMPLANT
SEAL CANN UNIV 5-8 DVNC XI (MISCELLANEOUS) ×3 IMPLANT
SEAL ROD LENS SCOPE MYOSURE (ABLATOR) ×1 IMPLANT
SEAL XI 5MM-8MM UNIVERSAL (MISCELLANEOUS) ×3
SEALER VESSEL DA VINCI XI (MISCELLANEOUS) ×1
SEALER VESSEL EXT DVNC XI (MISCELLANEOUS) IMPLANT
SOL PREP PVP 2OZ (MISCELLANEOUS) ×1
SOLUTION ELECTROLUBE (MISCELLANEOUS) ×1 IMPLANT
SOLUTION PREP PVP 2OZ (MISCELLANEOUS) IMPLANT
SURGILUBE 2OZ TUBE FLIPTOP (MISCELLANEOUS) ×1 IMPLANT
SUT MNCRL 4-0 (SUTURE) ×1
SUT MNCRL 4-0 27XMFL (SUTURE) ×1
SUTURE MNCRL 4-0 27XMF (SUTURE) ×1 IMPLANT
SYR 10ML LL (SYRINGE) ×1 IMPLANT
SYR TOOMEY IRRIG 70ML (MISCELLANEOUS) ×1
SYRINGE TOOMEY IRRIG 70ML (MISCELLANEOUS) IMPLANT
TUBING EVAC SMOKE HEATED PNEUM (TUBING) ×1 IMPLANT
WATER STERILE IRR 500ML POUR (IV SOLUTION) ×1 IMPLANT

## 2022-05-08 NOTE — Anesthesia Procedure Notes (Addendum)
Procedure Name: Intubation Date/Time: 05/08/2022 7:41 AM  Performed by: Debe Coder, CRNAPre-anesthesia Checklist: Patient identified, Emergency Drugs available, Suction available and Patient being monitored Patient Re-evaluated:Patient Re-evaluated prior to induction Oxygen Delivery Method: Circle system utilized Preoxygenation: Pre-oxygenation with 100% oxygen Induction Type: IV induction Ventilation: Mask ventilation without difficulty Laryngoscope Size: McGraph and 3 Tube type: Oral Tube size: 7.0 mm Number of attempts: 1 Airway Equipment and Method: Stylet Placement Confirmation: ETT inserted through vocal cords under direct vision, positive ETCO2 and breath sounds checked- equal and bilateral Secured at: 21 cm Tube secured with: Tape Dental Injury: Teeth and Oropharynx as per pre-operative assessment

## 2022-05-08 NOTE — Discharge Instructions (Signed)
AMBULATORY SURGERY  ?DISCHARGE INSTRUCTIONS ? ? ?The drugs that you were given will stay in your system until tomorrow so for the next 24 hours you should not: ? ?Drive an automobile ?Make any legal decisions ?Drink any alcoholic beverage ? ? ?You may resume regular meals tomorrow.  Today it is better to start with liquids and gradually work up to solid foods. ? ?You may eat anything you prefer, but it is better to start with liquids, then soup and crackers, and gradually work up to solid foods. ? ? ?Please notify your doctor immediately if you have any unusual bleeding, trouble breathing, redness and pain at the surgery site, drainage, fever, or pain not relieved by medication. ? ? ? ?Additional Instructions: ? ? ? ?Please contact your physician with any problems or Same Day Surgery at [phone], Monday through Friday 6 am to 4 pm, or [place] [facility] [place] Main number at [phone].  ?

## 2022-05-08 NOTE — Transfer of Care (Signed)
Immediate Anesthesia Transfer of Care Note  Patient: Ebony Lewis  Procedure(s) Performed: XI ROBOT ASSISTED DIAGNOSTIC LAPAROSCOPY, BILATERAL SALPINGECTOMY DILATATION AND CURETTAGE /HYSTEROSCOPY  Patient Location: PACU  Anesthesia Type:General  Level of Consciousness: awake, alert  and oriented  Airway & Oxygen Therapy: Patient Spontanous Breathing and Patient connected to face mask oxygen  Post-op Assessment: Report given to RN and Post -op Vital signs reviewed and stable  Post vital signs: Reviewed and stable  Last Vitals:  Vitals Value Taken Time  BP 104/68 05/08/22 0957  Temp    Pulse 85 05/08/22 1002  Resp 15 05/08/22 1002  SpO2 100 % 05/08/22 1002  Vitals shown include unvalidated device data.  Last Pain:  Vitals:   05/08/22 0621  TempSrc: Temporal  PainSc: 0-No pain         Complications: No notable events documented.

## 2022-05-08 NOTE — Anesthesia Postprocedure Evaluation (Signed)
Anesthesia Post Note  Patient: Ebony Lewis  Procedure(s) Performed: XI ROBOT ASSISTED DIAGNOSTIC LAPAROSCOPY, BILATERAL SALPINGECTOMY DILATATION AND CURETTAGE /HYSTEROSCOPY  Patient location during evaluation: PACU Anesthesia Type: General Level of consciousness: awake and alert Pain management: pain level controlled Vital Signs Assessment: post-procedure vital signs reviewed and stable Respiratory status: spontaneous breathing, nonlabored ventilation, respiratory function stable and patient connected to nasal cannula oxygen Cardiovascular status: blood pressure returned to baseline and stable Postop Assessment: no apparent nausea or vomiting Anesthetic complications: no   No notable events documented.   Last Vitals:  Vitals:   05/08/22 1058 05/08/22 1111  BP: 97/67 94/77  Pulse: 83 86  Resp: (!) 22 16  Temp: 36.5 C 36.8 C  SpO2: 99% 100%    Last Pain:  Vitals:   05/08/22 1111  TempSrc: Tympanic  PainSc: 0-No pain                 Dimas Millin

## 2022-05-08 NOTE — Anesthesia Preprocedure Evaluation (Signed)
Anesthesia Evaluation  Patient identified by MRN, date of birth, ID band Patient awake    Reviewed: Allergy & Precautions, NPO status , Patient's Chart, lab work & pertinent test results  History of Anesthesia Complications Negative for: history of anesthetic complications  Airway Mallampati: I  TM Distance: >3 FB Neck ROM: full    Dental  (+) Teeth Intact   Pulmonary asthma , former smoker,    Pulmonary exam normal        Cardiovascular negative cardio ROS Normal cardiovascular exam     Neuro/Psych PSYCHIATRIC DISORDERS Anxiety Depression negative neurological ROS     GI/Hepatic negative GI ROS, Neg liver ROS,   Endo/Other  negative endocrine ROS  Renal/GU      Musculoskeletal   Abdominal   Peds  Hematology negative hematology ROS (+)   Anesthesia Other Findings Past Medical History: No date: Allergy No date: Anemia No date: Anxiety     Comment:  Pt states PTSD s/p post delivery hemorrhage. No date: Asthma No date: Blood transfusion without reported diagnosis 01/2017: BRCA negative No date: Depression No date: Family history of breast cancer 01/2017: Genetic testing of female     Comment:  MyRisk neg; IBIS=22.9%/riskscore=34.6% No date: Heart murmur 01/2017: Increased risk of breast cancer     Comment:  IBIS=22.9%/riskscore=34.6% No date: Pneumonia No date: Renal cyst  Past Surgical History: 08/13/2018: BREAST BIOPSY; Right     Comment:  PSEUDO-ANGIOMATOUS STROMAL HYPERPLASIA AND FOCAL               ASSOCIATED  No date: DILATION AND CURETTAGE OF UTERUS     Comment:  EMERGENCY 11/30/2014: ENDOSCOPIC TURBINATE REDUCTION; Bilateral     Comment:  Procedure: ENDOSCOPIC TURBINATE REDUCTION;  Surgeon:               Carloyn Manner, MD;  Location: [place];                Service: ENT;  Laterality: Bilateral; 11/30/2014: FRONTAL SINUS EXPLORATION; Left     Comment:  Procedure: FRONTAL SINUS  EXPLORATION;  Surgeon:               Carloyn Manner, MD;  Location: [facility];                Service: ENT;  Laterality: Left; 11/30/2014: IMAGE GUIDED SINUS SURGERY; N/A     Comment:  Procedure: IMAGE GUIDED SINUS SURGERY;  Surgeon:               Carloyn Manner, MD;  Location: [place];                Service: ENT;  Laterality: N/A; 11/2014: NASAL SINUS SURGERY 11/30/2014: SEPTOPLASTY WITH ETHMOIDECTOMY, AND MAXILLARY ANTROSTOMY;  Bilateral     Comment:  Procedure: SEPTOPLASTY WITH ETHMOIDECTOMY, AND MAXILLARY              ANTROSTOMY;  Surgeon: Carloyn Manner, MD;  Location:               [place];  Service: ENT;  Laterality:               Bilateral; 11/30/2014: SPHENOIDECTOMY; Bilateral     Comment:  Procedure: SPHENOIDECTOMY;  Surgeon: Carloyn Manner,               MD;  Location: [place];  Service: ENT;                Laterality: Bilateral; No date:  TUBAL LIGATION  BMI    Body Mass Index: 24.22 kg/m      Reproductive/Obstetrics negative OB ROS                            Anesthesia Physical Anesthesia Plan  ASA: 2  Anesthesia Plan: General ETT   Post-op Pain Management: Ofirmev IV (intra-op)*, Toradol IV (intra-op)*, Dilaudid IV, Ketamine IV* and Precedex   Induction: Intravenous  PONV Risk Score and Plan: 4 or greater and Ondansetron, Dexamethasone, Midazolam and Treatment may vary due to age or medical condition  Airway Management Planned: Oral ETT  Additional Equipment:   Intra-op Plan:   Post-operative Plan: Extubation in OR  Informed Consent: I have reviewed the patients History and Physical, chart, labs and discussed the procedure including the risks, benefits and alternatives for the proposed anesthesia with the patient or authorized representative who has indicated his/her understanding and acceptance.     Dental Advisory Given  Plan Discussed with: Anesthesiologist, CRNA and  Surgeon  Anesthesia Plan Comments: (Patient consented for risks of anesthesia including but not limited to:  - adverse reactions to medications - damage to eyes, teeth, lips or other oral mucosa - nerve damage due to positioning  - sore throat or hoarseness - Damage to heart, brain, nerves, lungs, other parts of body or loss of life  Patient voiced understanding.)        Anesthesia Quick Evaluation

## 2022-05-08 NOTE — Op Note (Signed)
Operative Note    Name: Stephannie Peters  Date of Service: 05/08/2022  DOB: [birth year]  MRN: [id number]   Pre-Operative Diagnosis:  1. Chronic Pelvic Pain, female [R10.2, G89.29] 2. Abnormal vaginal discharge [R39.89]  Post-Operative Diagnosis:  1. Chronic Pelvic Pain, female [R10.2, G89.29] 2. Abnormal vaginal discharge [R39.89]  Procedures:  1. Hysteroscopy, dilation and curettage  2.  Robot-assisted diagnostic laparoscopy 3.  Robot-assisted laparoscopic bilateral salpingectomy  Primary Surgeon: Prentice Docker, MD   EBL: 10 mL   IVF: 900 mL   Urine output: 300 mL clear urine at end of case  FLUID DEFICIT: 140 mL  Specimens:  1. Endometrial curettings 2. Endocervical curettings 3. Right ovarian fossa peritoneum biopsy 4. Right pelvic brim peritoneal biopsy 5. Right fallopian tube with Falope Ring in place 6. Left fallopian tube with Falope Ring in place  Drains: None  Complications: None   Disposition: PACU   Condition: Stable   Findings:  1. pelvic exam revealed normal-appearing external female genitalia, normal vaginal epithelium with normal rugae, normal-appearing cervix, normal-appearing endocervical canal, normal-appearing uterine cavity. 2.  Normal-appearing liver and gallbladder. 3.  Normal-appearing appendix with light stringy adhesion to right fallopian tube. 4.  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  Mild adhesions of epiploica of the sigmoid colon to the left pelvic brim at the level of the infundibulopelvic ligament 6.  Otherwise normal-appearing uterus, anterior cul-de-sac, posterior cul-de-sac, and abdominal inspection.  7. No evidence of active infection or inflammation noted.  A large proportion of the pelvic tissue was mildly hyperemic without obvious inflammation. 8. There was also mild dilatation of the infundibulopelvic vessels as well as the uterine lateral vessels.  Procedure Summary:   After informed consent was obtained, the patient was taken to the operating room where anesthesia was obtained without difficulty. The patient was positioned in the dorsal lithotomy position in [place].  A foley catheter was placed without difficulty.  The patient was examined under anesthesia, with the above noted findings.  The bi-valved speculum was placed inside the patient's vagina, and the the anterior lip of the cervix was grasped with the tenaculum.  Then the cervix was progressively dilated to a 7 mm Hegar dilator.  The hysteroscope was introduced, with the above noted findings. The hystersocope was removed and the uterine cavity was curetted until a gritty texture was noted, yielding small endometrial curettings.  The hysteroscope was reintroduced with no apparent damage to the uterus and satisfactory global sampling.  The hysteroscope was removed.  The ZUMI uterine manipulator was placed in accordance to the manufacturer's recommendations.  The tenaculum was removed and the entry sites to the cervix were made hemostatic with silver nitrate.    Attention was turned to the abdomen where after injection of local anesthetic, an 8 mm infraumbilical incision was made with the scalpel. Entry into the abdomen was obtained via Optiview trocar technique (a blunt entry technique with camera visualization through the obturator upon entry). Verification of entry into the abdomen was obtained using opening pressures. The abdomen was insufflated with CO2. The camera was introduced through the trocar with verification of atraumatic entry.  Right and left abdominal entry sites were created after injection of local anesthetic about 9 cm away from the umbilical port in accordance with the Intuitive manufacturer's recommendations.  The port sites were 8 mm.  The intuitive trochars were introduced under intra-abdominal camera visualization without difficulty.  The XI robot was docked on the patient's left.  Clearance was verified from the patient's legs.  Through the umbilical port (arm 3) the camera was placed.  Through the port attached to arm 4 the forced bipolar forceps were placed.  Through the port attached to arm 4 the vessel sealer was was placed.    An inspection of the abdomen and pelvis was undertaken at this point.  With the main significant finding being the stellate contracture in the left ovarian fossa, this area was targeted for biopsy.  After identifying the ureter and noting that it was well away from this area, the peritoneum was elevated and after scoring the peritoneum the monopolar scissors were used to excise the peritoneum in a circumferential fashion around the contracture.  Great care was taken to verify that no underlying structures were present prior to cutting the peritoneum.  Hemostasis was noted at the edges of the biopsy site.  The biopsy was removed through the right lower quadrant port.  Adhesions were taken down at the left pelvic brim where the epiploica from the sigmoid colon were mildly adherent to the infundibulopelvic ligament and slightly lateral.  This was hemostatic.  All other areas had been assessed with no obvious findings of abnormal tissue or endometriosis.  There was a small cyst noted on the left ovary that was ruptured for clear fluid, and was likely functional.  The adhesive tissue between the appendix and the right fallopian tube was cauterized and transected.  This was a fairly lengthy piece of adhesive tissue and therefore was well away from the appendix.  I random biopsy of the right pelvic brim peritoneum was taken by elevating the peritoneum, scoring the peritoneum with the scissors, and transecting the peritoneum with the scissors after ensuring no underlying tissue was included.  This biopsy specimen was also removed through the right lower quadrant port.  Hemostasis was noted at this site as well as the adhesion to the appendix.  Interceed was placed at  the 2 biopsy sites, as well as where the epiploica was separated from the pelvic sidewall.  The abdominal pressure was lowered to 5 mmHg and hemostasis was confirmed.  At this point the robot was undocked after all instruments were removed.  500 mL of sterile warm saline was introduced into the pelvic cavity.  The abdomen was emptied of CO2 with the aid of 5 deep breaths from anesthesia.  The trocars were removed.  The incision sites were closed in a subcuticular fashion using 4-0 Monocryl after placing a subcutaneous reinforcement stitch at each incision.  Surgical skin glue was used to reapproximate and reinforce the skin closures.  Attention was returned to the pelvis.  The Foley catheter was removed.  The ZUMI uterine manipulator was removed.  A sterile speculum was placed in the vagina and verification that hemostasis was noted at the tenaculum entry sites was undertaken and was satisfactory.  There was no active bleeding from the cervix.  The vagina was verified to be free of instruments and sponges.  The speculum was removed.  The patient tolerated the procedure well.  Sponge, lap, needle, and instrument counts were correct x 2.  VTE prophylaxis: SCDs. Antibiotic prophylaxis: none indicated and none given. She was awakened in the operating room and was taken to the PACU in stable condition.   Prentice Docker, MD 05/08/2022 10:05 AM

## 2022-05-08 NOTE — H&P (Addendum)
Preoperative History and Physical  Ebony Lewis is a 36 y.o. [id number] here for surgical management of chronic pelvic pain in female, abnormal vaginal discharge.   No significant preoperative concerns.  History of Present Illness: 36 y.o. G28P2002 female who presents with continued abnormal discharge. She has also been having pain that is in her uterus, both ovaries, and now radiates around her right side to her low back. This is where she experiences most of her pain. She describes her discharge as yellow and has a yellow fluid (watery). Sometimes yellow that is thick and sticky. She hasn't had normal discharge since taking the first course of antibiotics for PID. Her husband has taken doxycycline for two weeks, though he didn't test positive for anything.  She has pain and fatigue. She has internal pain. She could barely put in a tampon with her most recent period. Her periods have gotten worse. Her periods still come regularly, though her most recent one was delayed by 2 days and lasted a full 7 days. The period was heavy and she was passing lots of clots. She denies having an odor that is foul, though there is a stronger smell than she has had. She has had a normal CT and pelvic ultrasound.   The pain is constant. Aggravating factors: exercising the muscles of this area. Alleviating: lying down. Associated symptoms: fatigue. She rates the pain 6/10 and constant. She describes the pain as aching. Her menstrual pain is worse than it used to be and her baseline pain is worse with menses.  She has already tried to self treat with some leftover oral metronidazole, which she took for 5 days. This made her sick.   She is tired of these symptoms and is ready to have something more aggressive done. She does have pain with voiding (sharp pain in her uterus). This doesn't happen all the time. She denies malodorous urine, hematuria, hematochezia.   Negative urine culture 01/2022 Wet prep 01/23/2022: Many  bacteria, few WBCs, no clue cells, no trichomas, no yeast 01/23/2022: neg for mycoplasma genitalium, mycoplasma hominis, ureaplasma, chlamydia, gonorrhea  Wet prep 10/04/2021: no clue cells, few WBCs, no trichomonas, few bacteria, no yeast 10/09/2021: NuSwab - no BV, neg candida albicans and glabrata, neg trich, neg chlamydia, neg gonorrhea 08/27/2021: negative urine culture  08/27/2021: pap smear - NILM, HPV negative 08/27/2021: trichomonas NAAT negative  08/01/2021: negative syphilis screen 08/01/2021: negative chlamydia, negative gonorrhea 08/01/2021: wet prep: neg clue cells, few WBCs, neg trich, bacteria Many, no yeast 08/01/2021: neg HiV screen  CT scan: 03/01/2022: No acute abnormality in the abdomen/pelvis. 2. A 8 mm hypodensity/cyst in the right lateral aspect of the lower vagina or labia may represent a Bartholin's cyst. 3. No evidence of pelvic inflammation, hydro/pyosalpinx or fluid collection. 4. Moderate colonic stool burden, can be seen with constipation.   Pelvic u/s 08/02/2021: Ut wnl  Endometrium=14.27 mm Lt ov complex cyst= 1.84 cm Rt ov Collapsed follicle cyst No free fluid No adnexal mass   About a month ago she had an upper respiratory symptoms. She was tested for a lot of infections. She was not tested for mononucleosis. She had multiple antibiotics and prednisone. At one point she had a headache and then developed a stiff neck. So, she had a spinal tap and this was negative. She has had a headache for about 6 weeks. She no longer has a stiff neck.   She has now started bleeding after intercourse. She is still having yellow-green discharge. She has  a constant cramping and like period cramps. Sometimes this gets a little more intense.   HER PAIN IS MOSTLY ON HER RIGHT SIDE IN THE AREA OF HER OVARY AND AT HER HIP BONE UP TO HER RIBS.  Proposed surgery: Hysteroscopy, dilation and curettage, robot assisted diagnostic laparoscopy, bilateral salpingectomy, removal of  abnormal tissue  Past Medical History:  Diagnosis Date   Allergy    Anemia    Anxiety    Pt states PTSD s/p post delivery hemorrhage.   Asthma    Blood transfusion without reported diagnosis    BRCA negative 01/2017   Depression    Family history of breast cancer    Genetic testing of female 01/2017   MyRisk neg; IBIS=22.9%/riskscore=34.6%   Heart murmur    Increased risk of breast cancer 01/2017   IBIS=22.9%/riskscore=34.6%   Pneumonia    Renal cyst    Past Surgical History:  Procedure Laterality Date   BREAST BIOPSY Right 08/13/2018   PSEUDO-ANGIOMATOUS STROMAL HYPERPLASIA AND FOCAL ASSOCIATED    DILATION AND CURETTAGE OF UTERUS     EMERGENCY   ENDOSCOPIC TURBINATE REDUCTION Bilateral 11/30/2014   Procedure: ENDOSCOPIC TURBINATE REDUCTION;  Surgeon: Carloyn Manner, MD;  Location: [place];  Service: ENT;  Laterality: Bilateral;   FRONTAL SINUS EXPLORATION Left 11/30/2014   Procedure: FRONTAL SINUS EXPLORATION;  Surgeon: Carloyn Manner, MD;  Location: [place];  Service: ENT;  Laterality: Left;   IMAGE GUIDED SINUS SURGERY N/A 11/30/2014   Procedure: IMAGE GUIDED SINUS SURGERY;  Surgeon: Carloyn Manner, MD;  Location: [place];  Service: ENT;  Laterality: N/A;   NASAL SINUS SURGERY  11/2014   SEPTOPLASTY WITH ETHMOIDECTOMY, AND MAXILLARY ANTROSTOMY Bilateral 11/30/2014   Procedure: SEPTOPLASTY WITH ETHMOIDECTOMY, AND MAXILLARY ANTROSTOMY;  Surgeon: Carloyn Manner, MD;  Location: [place];  Service: ENT;  Laterality: Bilateral;   SPHENOIDECTOMY Bilateral 11/30/2014   Procedure: Coralee P[place];  Surgeon: Carloyn Manner, MD;  Location: [place];  Service: ENT;  Laterality: Bilateral;   TUBAL LIGATION     OB History  Gravida Para Term Preterm AB Living  _0 SAB IAB Ectopic Multiple Live Births          2    # Outcome Date GA Lbr Len/2nd Weight Sex Delivery Anes PTL Lv  2 Term 02/26/13    F Vag-Spont  N LIV   1 Term 07/24/08    F Vag-Spont  N LIV  Patient denies any other pertinent gynecologic issues.   No current facility-administered medications on file prior to encounter.   Current Outpatient Medications on File Prior to Encounter  Medication Sig Dispense Refill   VENTOLIN HFA 108 (90 Base) MCG/ACT inhaler INHALE 2 PUFFS INTO THE LUNGS AS NEEDED. 18 g 6   Allergies  Allergen Reactions   Bactrim [Sulfamethoxazole-Trimethoprim] Hives   Amoxicillin Diarrhea and Rash    Social History:   reports that she quit smoking about 10 years ago. Her smoking use included cigarettes. She has never used smokeless tobacco. She reports current alcohol use of about 1.0 standard drink of alcohol per week. She reports that she does not use drugs.  Family History  Problem Relation Age of Onset   Heart murmur Father    Hypertension Father    Anxiety disorder Father    Hyperlipidemia Father    Heart disease Father        stent    Coronary artery disease Maternal Grandmother  Fibromyalgia Maternal Grandmother    Cancer Maternal Grandmother 22       Melanoma, cervical, ovarian   Cancer Mother 77       Lung Cancer--question mets from breast   Diabetes Paternal Grandfather    Heart disease Paternal Grandfather    Diabetes Maternal Grandfather    Hypertension Maternal Grandfather    Cancer Paternal Grandmother        Stage III lung cancer   Multiple sclerosis Paternal Grandmother    Breast cancer Other 2   Cancer Other        blood; tested positive for breast cancer genetic mutation--? type    Review of Systems: Noncontributory  PHYSICAL EXAM: Blood pressure 115/73, pulse 97, temperature (!) 97.2 F (36.2 C), temperature source Temporal, resp. rate 18, height 5' 9" (1.753 m), weight 74.4 kg, last menstrual period 04/05/2022, SpO2 100 %. CONSTITUTIONAL: Well-developed, well-nourished female in no acute distress.  HENT:  Normocephalic, atraumatic, External right and left ear normal. Oropharynx  is clear and moist EYES: Conjunctivae and EOM are normal. Pupils are equal, round, and reactive to light. No scleral icterus.  NECK: Normal range of motion, supple, no masses SKIN: Skin is warm and dry. No rash noted. Not diaphoretic. No erythema. No pallor. [place]: Alert and oriented to person, place, and time. Normal reflexes, muscle tone coordination. No cranial nerve deficit noted. PSYCHIATRIC: Normal mood and affect. Normal behavior. Normal judgment and thought content. CARDIOVASCULAR: Normal heart rate noted, regular rhythm RESPIRATORY: Effort and breath sounds normal, no problems with respiration noted ABDOMEN: Soft, nontender, nondistended. PELVIC: Deferred MUSCULOSKELETAL: Normal range of motion. No edema and no tenderness. 2+ distal pulses.  Labs: Results for orders placed or performed during the hospital encounter of 05/08/22 (from the past 336 hour(s))  Pregnancy, urine POC   Collection Time: 05/08/22  6:11 AM  Result Value Ref Range   Preg Test, Ur NEGATIVE NEGATIVE    Imaging Studies: No results found.  Assessment: Chronic pelvic pain in female Abnormal vaginal discharge  Plan: Patient will undergo surgical management with the above surgery.   The risks of surgery were discussed in detail with the patient including but not limited to: bleeding which may require transfusion or reoperation; infection which may require antibiotics; injury to surrounding organs which may involve bowel, bladder, ureters ; need for additional procedures including laparoscopy or laparotomy; thromboembolic phenomenon, surgical site problems and other postoperative/anesthesia complications. Likelihood of success in alleviating the patient's condition was discussed. Routine postoperative instructions will be reviewed with the patient and her family in detail after surgery.  The patient concurred with the proposed plan, giving informed written consent for the surgery.  Patient has been NPO since  last night she will remain NPO for procedure.  Anesthesia and OR aware.  Preoperative prophylactic antibiotics, as indicated, and SCDs ordered on call to the OR.  To OR when ready.  Leave 0.5 liters warm saline  Prentice Docker, MD 05/08/2022 7:18 AM

## 2022-05-09 ENCOUNTER — Encounter: Payer: Self-pay | Admitting: Obstetrics and Gynecology

## 2022-05-09 LAB — SURGICAL PATHOLOGY

## 2022-07-29 ENCOUNTER — Other Ambulatory Visit (HOSPITAL_COMMUNITY): Payer: BC Managed Care – PPO

## 2022-07-29 ENCOUNTER — Other Ambulatory Visit (HOSPITAL_COMMUNITY): Payer: Self-pay | Admitting: Obstetrics and Gynecology

## 2022-07-29 DIAGNOSIS — Z789 Other specified health status: Secondary | ICD-10-CM

## 2022-07-31 ENCOUNTER — Ambulatory Visit (HOSPITAL_COMMUNITY)
Admission: RE | Admit: 2022-07-31 | Discharge: 2022-07-31 | Disposition: A | Discharge status: Home / Self Care | Payer: 59 | Source: Ambulatory Visit | Attending: Obstetrics and Gynecology | Admitting: Obstetrics and Gynecology

## 2022-07-31 DIAGNOSIS — Z789 Other specified health status: Secondary | ICD-10-CM

## 2022-07-31 DIAGNOSIS — I878 Other specified disorders of veins: Secondary | ICD-10-CM | POA: Diagnosis not present

## 2022-07-31 DIAGNOSIS — N719 Inflammatory disease of uterus, unspecified: Secondary | ICD-10-CM | POA: Diagnosis not present

## 2022-07-31 MED ORDER — LIDOCAINE HCL 1 % IJ SOLN
INTRAMUSCULAR | Status: AC
  Administered 2022-07-31: 3 mL via INTRADERMAL
  Filled 2022-07-31: qty 20

## 2022-07-31 MED ORDER — HEPARIN SOD (PORK) LOCK FLUSH 100 UNIT/ML IV SOLN
INTRAVENOUS | Status: AC
  Administered 2022-07-31: 5 [IU]
  Filled 2022-07-31: qty 5

## 2022-07-31 NOTE — Procedures (Signed)
Successful placement of single lumen PICC line to right brachial vein. Length 35 cm Tip at lower SVC/RA PICC capped No complications Ready for use.  EBL < 5 mL   Tyson Alias, AGNP 07/31/2022 4:20 PM

## 2022-08-04 ENCOUNTER — Emergency Department (HOSPITAL_COMMUNITY)
Admission: EM | Admit: 2022-08-04 | Discharge: 2022-08-04 | Disposition: A | Discharge status: Home / Self Care | Payer: 59 | Attending: Emergency Medicine | Admitting: Emergency Medicine

## 2022-08-04 ENCOUNTER — Encounter (HOSPITAL_COMMUNITY): Payer: Self-pay | Admitting: Emergency Medicine

## 2022-08-04 DIAGNOSIS — T82594A Other mechanical complication of infusion catheter, initial encounter: Secondary | ICD-10-CM | POA: Insufficient documentation

## 2022-08-04 DIAGNOSIS — Y69 Unspecified misadventure during surgical and medical care: Secondary | ICD-10-CM | POA: Insufficient documentation

## 2022-08-04 DIAGNOSIS — Z87891 Personal history of nicotine dependence: Secondary | ICD-10-CM | POA: Diagnosis not present

## 2022-08-04 DIAGNOSIS — Z452 Encounter for adjustment and management of vascular access device: Secondary | ICD-10-CM

## 2022-08-04 MED ORDER — HEPARIN SOD (PORK) LOCK FLUSH 100 UNIT/ML IV SOLN
500.0000 [IU] | Freq: Once | INTRAVENOUS | Status: AC
  Administered 2022-08-04: 500 [IU]
  Filled 2022-08-04: qty 5

## 2022-08-04 NOTE — Discharge Instructions (Addendum)
Note the visit to the emergency department today was overall reassuring.  As discussed, continue your at home treatment regimen with subsequent flushing of PICC line.  Recommend reevaluation by primary care/OB/GYN for further management of your PICC line.  Please do not hesitate to return to emergency department for worrisome signs and symptoms we discussed become apparent.

## 2022-08-04 NOTE — ED Provider Notes (Signed)
[place] AT [facility] Provider Note   CSN: [id number] Arrival date & time: 08/04/22  2035     History  No chief complaint on file.   Ebony Lewis is a 37 y.o. female.  HPI   37 year old female presents emergency department with complaints of blocked PICC line.  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  States that she was trying to administer medications at home earlier today and noticed "bubbling" and was concerned about potential blockage.  Home health nurse subsequently came over and was able to draw back blood and sent to emergency department for further evaluation.  Upon initial presentation, nurse was able to flush and draw back blood from PICC line.  Patient is concerned about the potential clot was pushed into her artery.  Patient currently not complaining of symptoms.  Patient has had multiple life stressors recently with recent loss of spouse dealing with chronic infection as well as multiple kids at home.  Denies slurred speech, facial droop, weakness/sensory deficits in upper or lower extremities, gait abnormalities  Past medical history significant for PID, anxiety, asthma, allergy  Home Medications Prior to Admission medications   Medication Sig Start Date End Date Taking? Authorizing Provider  cetirizine (ZYRTEC) 10 MG tablet Take 10 mg by mouth daily.    [provider]  ibuprofen (ADVIL) 600 MG tablet Take 1 tablet (600 mg total) by mouth every 6 (six) hours. 05/08/22   Will Bonnet, MD  oxyCODONE-acetaminophen (PERCOCET) 5-325 MG tablet Take 1 tablet by mouth every 6 (six) hours as needed for severe pain. 05/08/22 05/08/23  Will Bonnet, MD  VENTOLIN HFA 108 [phone] Base) MCG/ACT inhaler INHALE 2 PUFFS INTO THE LUNGS AS NEEDED. 06/27/17   Nahser, [name], MD      Allergies    Bactrim [sulfamethoxazole-trimethoprim] and Amoxicillin    Review of Systems   Review of Systems  All other systems  reviewed and are negative.   Physical Exam Updated Vital Signs BP 108/87   Pulse 96   Temp 98.6 F (37 C)   Resp 18   SpO2 100%  Physical Exam Vitals and nursing note reviewed.  Constitutional:      General: She is not in acute distress.    Appearance: She is well-developed.  HENT:     Head: Normocephalic and atraumatic.  Eyes:     Conjunctiva/sclera: Conjunctivae normal.  Cardiovascular:     Rate and Rhythm: Normal rate and regular rhythm.     Heart sounds: No murmur heard. Pulmonary:     Effort: Pulmonary effort is normal. No respiratory distress.     Breath sounds: Normal breath sounds.  Abdominal:     Palpations: Abdomen is soft.     Tenderness: There is no abdominal tenderness.  Musculoskeletal:        General: No swelling.     Cervical back: Neck supple.  Skin:    General: Skin is warm and dry.     Capillary Refill: Capillary refill takes less than 2 seconds.  Neurological:     Mental Status: She is alert.  Psychiatric:        Mood and Affect: Mood normal.     ED Results / Procedures / Treatments   Labs (all labs ordered are listed, but only abnormal results are displayed) Labs Reviewed - No data to display  EKG None  Radiology No results found.  Procedures Procedures    Medications Ordered in ED Medications  heparin lock flush 100 unit/mL (500 Units Intracatheter Given 08/04/22 2200)    ED Course/ Medical Decision Making/ A&P                             Medical Decision Making  This patient presents to the ED for concern of PICC line malfunction, this involves an extensive number of treatment options, and is a complaint that carries with it a high risk of complications and morbidity.  The differential diagnosis includes PICC line malfunction   Co morbidities that complicate the patient evaluation  See HPI   Additional history obtained:  Additional history obtained from EMR External records from outside source obtained and reviewed  including hospital records   Lab Tests:  N/a   Imaging Studies ordered:  N/a   Cardiac Monitoring: / EKG:  The patient was maintained on a cardiac monitor.  I personally viewed and interpreted the cardiac monitored which showed an underlying rhythm of: Sinus rhythm   Consultations Obtained:  N/a   Problem List / ED Course / Critical interventions / Medication management  PICC line malfunction I ordered medication including heparin flush   Reevaluation of the patient after these medicines showed that the patient improved I have reviewed the patients home medicines and have made adjustments as needed   Social Determinants of Health:  Former cigarette use.  Denies illicit drug use.   Test / Admission - Considered:  PICC line malfunction Vitals signs within normal range and stable throughout visit. Patient with evidence of PICC line malfunction.  Line was flushed as well as drew back blood by nursing staff.  Patient most concerned about potential clot that was pushed into her circulation.  Patient currently neurologically intact with no evidence of arterial blockage.  Patient given heparin lock flush while emergency department.  Recommend continued outpatient management of PID using PICC line.  Patient to be staying with friend who is nurse practitioner will be checking outpatient for repeatedly overnight.  Patient with stressful life circumstances increasing anxiety around situation currently.  Patient with heparin flushed of PICC line.  Recommend continued outpatient therapy for PID.  Treatment plan discussed at length with patient and she acknowledged understanding was agreeable to said plan. Worrisome signs and symptoms were discussed with the patient, and the patient acknowledged understanding to return to the ED if noticed. Patient was stable upon discharge.          Final Clinical Impression(s) / ED Diagnoses Final diagnoses:  PIC line (peripherally inserted  central catheter) flush    Rx / DC Orders ED Discharge Orders     None         Wilnette Kales, [place] 08/04/22 2212    Tretha Sciara, MD 08/04/22 2228

## 2022-08-04 NOTE — ED Triage Notes (Signed)
Pt here from home with  c/o picc line not flushing , upon arrival to the ED Picc  line flushes and draws  back blood .,

## 2022-08-15 ENCOUNTER — Emergency Department (HOSPITAL_COMMUNITY)
Admission: EM | Admit: 2022-08-15 | Discharge: 2022-08-16 | Disposition: A | Discharge status: Home / Self Care | Payer: 59 | Attending: Emergency Medicine | Admitting: Emergency Medicine

## 2022-08-15 ENCOUNTER — Encounter (HOSPITAL_COMMUNITY): Payer: Self-pay

## 2022-08-15 DIAGNOSIS — M79621 Pain in right upper arm: Secondary | ICD-10-CM | POA: Diagnosis present

## 2022-08-15 DIAGNOSIS — J45909 Unspecified asthma, uncomplicated: Secondary | ICD-10-CM | POA: Diagnosis not present

## 2022-08-15 DIAGNOSIS — Z452 Encounter for adjustment and management of vascular access device: Secondary | ICD-10-CM | POA: Insufficient documentation

## 2022-08-15 DIAGNOSIS — D72829 Elevated white blood cell count, unspecified: Secondary | ICD-10-CM | POA: Diagnosis not present

## 2022-08-15 DIAGNOSIS — Z95828 Presence of other vascular implants and grafts: Secondary | ICD-10-CM

## 2022-08-15 LAB — CBC WITH DIFFERENTIAL/PLATELET
Abs Immature Granulocytes: 0.02 10*3/uL (ref 0.00–0.07)
Basophils Absolute: 0.1 10*3/uL (ref 0.0–0.1)
Basophils Relative: 1 %
Eosinophils Absolute: 0.4 10*3/uL (ref 0.0–0.5)
Eosinophils Relative: 4 %
HCT: 41.8 % (ref 36.0–46.0)
Hemoglobin: 13.8 g/dL (ref 12.0–15.0)
Immature Granulocytes: 0 %
Lymphocytes Relative: 24 %
Lymphs Abs: 2.7 10*3/uL (ref 0.7–4.0)
MCH: 30.4 pg (ref 26.0–34.0)
MCHC: 33 g/dL (ref 30.0–36.0)
MCV: 92.1 fL (ref 80.0–100.0)
Monocytes Absolute: 1.1 10*3/uL — ABNORMAL HIGH (ref 0.1–1.0)
Monocytes Relative: 9 %
Neutro Abs: 7.2 10*3/uL (ref 1.7–7.7)
Neutrophils Relative %: 62 %
Platelets: 279 10*3/uL (ref 150–400)
RBC: 4.54 MIL/uL (ref 3.87–5.11)
RDW: 12.2 % (ref 11.5–15.5)
WBC: 11.5 10*3/uL — ABNORMAL HIGH (ref 4.0–10.5)
nRBC: 0 % (ref 0.0–0.2)

## 2022-08-15 NOTE — ED Triage Notes (Signed)
Patient arrived stating that today her PICC line is bleeding around the site and some right axilla tenderness that started today. States she is already on Rocephin.

## 2022-08-15 NOTE — ED Provider Notes (Signed)
[facility] Provider Note  CSN: [id number] Arrival date & time: 08/15/22 2116  Chief Complaint(s) Vascular Access Problem  HPI Ebony Lewis is a 37 y.o. female With a past medical history listed below including chronic endometritis currently IV antibiotics through a PICC line that was placed 1 week ago.  She presents to the emergency department for swelling and mild tenderness to right axilla (of the arm where the PICC line is placed).  This began earlier today.  Patient also noted small amount of bleeding through the insertion site.  She reports that this morning there was mild erythema that has since subsided.  She reports that the PICC line is working just fine. Dressing was changed earlier in the day.  She denies any fevers or chills.  No chest pain or shortness of breath.  No other physical complaints.  The history is provided by the patient.    Past Medical History Past Medical History:  Diagnosis Date   Allergy    Anemia    Anxiety    Pt states PTSD s/p post delivery hemorrhage.   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 Authorizing Provider  cetirizine (ZYRTEC) 10 MG tablet Take 10 mg by mouth daily.    [provider]  ibuprofen (ADVIL) 600 MG tablet Take 1 tablet (600 mg total) by mouth every 6 (six) hours. 05/08/22   Will Bonnet, MD  oxyCODONE-acetaminophen (PERCOCET) 5-325 MG tablet Take 1 tablet by mouth every 6 (six) hours as needed for severe pain. 05/08/22 05/08/23  Will Bonnet, MD  VENTOLIN HFA 108 [phone] Base) MCG/ACT inhaler INHALE 2 PUFFS INTO THE LUNGS AS NEEDED. 06/27/17   Nahser, [name], MD  Allergies Influenza virus vaccine, Bactrim [sulfamethoxazole-trimethoprim], and Amoxicillin  Review of Systems Review of Systems As noted in HPI  Physical Exam Vital Signs  I have reviewed the triage vital signs BP 125/88 (BP Location: Right Wrist)   Pulse 79   Temp (!) 97.5 F (36.4 C) (Oral)   Resp 18   Ht '5\' 9"'$  (1.753 m)   Wt 74.8 kg   LMP  (LMP Unknown)   SpO2 100%   BMI 24.37 kg/m   Physical Exam Vitals reviewed.  Constitutional:      General: She is not in acute distress.    Appearance: She is well-developed. She is not diaphoretic.  HENT:     Head: Normocephalic and atraumatic.     Right Ear: External ear normal.     Left Ear: External ear normal.     Nose: Nose normal.  Eyes:     General: No scleral icterus.    Conjunctiva/sclera: Conjunctivae normal.  Neck:     Trachea: Phonation normal.  Cardiovascular:     Rate and Rhythm: Normal rate and regular rhythm.  Pulmonary:     Effort: Pulmonary effort is normal. No respiratory distress.     Breath sounds: No stridor.  Chest:     Chest wall: Tenderness present.    Abdominal:      General: There is no distension.  Musculoskeletal:        General: Normal range of motion.       Arms:     Cervical back: Normal range of motion.  Lymphadenopathy:     Upper Body:     Right upper body: No axillary or pectoral adenopathy.     Left upper body: No axillary or pectoral adenopathy.  Neurological:     Mental Status: She is alert and oriented to person, place, and time.  Psychiatric:        Behavior: Behavior normal.     ED Results and Treatments Labs (all labs ordered are listed, but only abnormal results are displayed) Labs Reviewed  CBC WITH DIFFERENTIAL/PLATELET - Abnormal; Notable for the following components:      Result Value   WBC 11.5 (*)    Monocytes Absolute 1.1 (*)    All other components within normal limits  COMPREHENSIVE METABOLIC PANEL - Abnormal; Notable for the following components:   CO2 21 (*)    Calcium 8.7 (*)    All other components within normal limits  D-DIMER, QUANTITATIVE - Abnormal; Notable for the following components:   D-Dimer, Quant 0.81 (*)    All other components within normal limits                                                                                                                         EKG  EKG Interpretation  Date/Time:    Ventricular Rate:    PR Interval:    QRS Duration:   QT Interval:    QTC Calculation:   R Axis:  Text Interpretation:         Radiology DG Chest Port 1 View  Result Date: 08/16/2022 CLINICAL DATA:  PICC line placement. EXAM: PORTABLE CHEST 1 VIEW COMPARISON:  09/08/2019. FINDINGS: The heart size and mediastinal contours are within normal limits. Both lungs are clear. No acute osseous abnormality. A right PICC line terminates over the superior vena cava. IMPRESSION: Right PICC line terminates over the superior vena cava. Electronically Signed   By: Brett Fairy M.D.   On: 08/16/2022 00:24    Medications Ordered in ED Medications  enoxaparin (LOVENOX) injection 75 mg (75 mg  Subcutaneous Given 08/16/22 0125)                                                                                                                                     Procedures Procedures  (including critical care time)  Medical Decision Making / ED Course   Medical Decision Making Amount and/or Complexity of Data Reviewed Labs: ordered. Decision-making details documented in ED Course. Radiology: ordered and independent interpretation performed. Decision-making details documented in ED Course.  Risk Prescription drug management.   Patient presents with right axilla swelling and tenderness. PICC line in place.  Differential includes but not limited to DVT, thrombophlebitis, line infection though I feel this is less likely.  CBC with mild leukocytosis.  No anemia.  No thrombocytopenia. CMP without significant electrolyte derangement or renal insufficiency. Dimer slightly elevated at 0.81  Chest x-ray shows that the PICC line is in appropriate position.  No pneumonia, pneumothorax, pulmonary edema or pleural effusions noted.  At this time a night, vascular ultrasound is not available to assess for DVT. PICC team consulted to assess the line itself. They believe line is well appearing. Not concerning for infection.  Will treat for possible DVT with dose of Lovenox and have return for [place] in the morning.      Final Clinical Impression(s) / ED Diagnoses Final diagnoses:  S/P PICC central line placement  Axillary tenderness, right   The patient appears reasonably screened and/or stabilized for discharge and I doubt any other medical condition or other [facility] requiring further screening, evaluation, or treatment in the ED at this time. I have discussed the findings, Dx and Tx plan with the patient/family who expressed understanding and agree(s) with the plan. Discharge instructions discussed at length. The patient/family was given strict return precautions who verbalized understanding of  the instructions. No further questions at time of discharge.  Disposition: Discharge  Condition: Good  ED Discharge Orders          Ordered    UE VENOUS DUPLEX        08/16/22 0130             Follow Up: Dian Queen, [place] [address] [place] [address] [phone]  Call  as needed           This chart  was dictated using voice recognition software.  Despite best efforts to proofread,  errors can occur which can change the documentation meaning.    Fatima Blank, MD 08/16/22 [phone]

## 2022-08-16 ENCOUNTER — Emergency Department (HOSPITAL_COMMUNITY): Payer: 59

## 2022-08-16 ENCOUNTER — Ambulatory Visit (HOSPITAL_BASED_OUTPATIENT_CLINIC_OR_DEPARTMENT_OTHER)
Admission: RE | Admit: 2022-08-16 | Discharge: 2022-08-16 | Disposition: A | Discharge status: Home / Self Care | Payer: 59 | Source: Ambulatory Visit | Attending: Emergency Medicine | Admitting: Emergency Medicine

## 2022-08-16 DIAGNOSIS — M7989 Other specified soft tissue disorders: Secondary | ICD-10-CM

## 2022-08-16 DIAGNOSIS — Z452 Encounter for adjustment and management of vascular access device: Secondary | ICD-10-CM | POA: Insufficient documentation

## 2022-08-16 LAB — COMPREHENSIVE METABOLIC PANEL
ALT: 14 U/L (ref 0–44)
AST: 23 U/L (ref 15–41)
Albumin: 4.6 g/dL (ref 3.5–5.0)
Alkaline Phosphatase: 47 U/L (ref 38–126)
Anion gap: 11 (ref 5–15)
BUN: 9 mg/dL (ref 6–20)
CO2: 21 mmol/L — ABNORMAL LOW (ref 22–32)
Calcium: 8.7 mg/dL — ABNORMAL LOW (ref 8.9–10.3)
Chloride: 105 mmol/L (ref 98–111)
Creatinine, Ser: 0.93 mg/dL (ref 0.44–1.00)
GFR, Estimated: >60 mL/min (ref >60)
Glucose, Bld: 93 mg/dL (ref 70–99)
Potassium: 4.4 mmol/L (ref 3.5–5.1)
Sodium: 137 mmol/L (ref 135–145)
Total Bilirubin: 0.7 mg/dL (ref 0.3–1.2)
Total Protein: 7.6 g/dL (ref 6.5–8.1)

## 2022-08-16 LAB — D-DIMER, QUANTITATIVE: D-Dimer, Quant: 0.81 ug/mL-FEU — ABNORMAL HIGH (ref 0.00–0.50)

## 2022-08-16 MED ORDER — ENOXAPARIN SODIUM 80 MG/0.8ML IJ SOSY
1.0000 mg/kg | PREFILLED_SYRINGE | Freq: Once | INTRAMUSCULAR | Status: AC
  Administered 2022-08-16: 75 mg via SUBCUTANEOUS
  Filled 2022-08-16: qty 0.75

## 2022-08-16 NOTE — Progress Notes (Signed)
Consulted to evaluate PICC on Pt with complaints of R axilla tenderness. X-ray shows PICC in good position. No redness at insertion site. Dressing changed 2/1 per patient ,CDI.,M.D. aware of findings.

## 2022-08-26 ENCOUNTER — Encounter (HOSPITAL_COMMUNITY): Payer: Self-pay

## 2022-08-26 ENCOUNTER — Emergency Department (HOSPITAL_BASED_OUTPATIENT_CLINIC_OR_DEPARTMENT_OTHER)
Admit: 2022-08-26 | Discharge: 2022-08-26 | Disposition: A | Discharge status: Home / Self Care | Payer: 59 | Attending: Emergency Medicine | Admitting: Emergency Medicine

## 2022-08-26 ENCOUNTER — Emergency Department (HOSPITAL_COMMUNITY)
Admission: EM | Admit: 2022-08-26 | Discharge: 2022-08-26 | Disposition: A | Discharge status: Home / Self Care | Payer: 59 | Attending: Emergency Medicine | Admitting: Emergency Medicine

## 2022-08-26 ENCOUNTER — Other Ambulatory Visit: Payer: Self-pay

## 2022-08-26 ENCOUNTER — Emergency Department (HOSPITAL_COMMUNITY): Payer: 59

## 2022-08-26 DIAGNOSIS — I82621 Acute embolism and thrombosis of deep veins of right upper extremity: Secondary | ICD-10-CM

## 2022-08-26 DIAGNOSIS — Z7901 Long term (current) use of anticoagulants: Secondary | ICD-10-CM | POA: Diagnosis not present

## 2022-08-26 DIAGNOSIS — E876 Hypokalemia: Secondary | ICD-10-CM | POA: Insufficient documentation

## 2022-08-26 DIAGNOSIS — M7989 Other specified soft tissue disorders: Secondary | ICD-10-CM | POA: Diagnosis not present

## 2022-08-26 DIAGNOSIS — T829XXA Unspecified complication of cardiac and vascular prosthetic device, implant and graft, initial encounter: Secondary | ICD-10-CM

## 2022-08-26 DIAGNOSIS — R200 Anesthesia of skin: Secondary | ICD-10-CM | POA: Diagnosis present

## 2022-08-26 DIAGNOSIS — T82594A Other mechanical complication of infusion catheter, initial encounter: Secondary | ICD-10-CM | POA: Insufficient documentation

## 2022-08-26 LAB — CBC WITH DIFFERENTIAL/PLATELET
Abs Immature Granulocytes: 0.04 10*3/uL (ref 0.00–0.07)
Basophils Absolute: 0.1 10*3/uL (ref 0.0–0.1)
Basophils Relative: 1 %
Eosinophils Absolute: 0.1 10*3/uL (ref 0.0–0.5)
Eosinophils Relative: 1 %
HCT: 40.8 % (ref 36.0–46.0)
Hemoglobin: 13.6 g/dL (ref 12.0–15.0)
Immature Granulocytes: 0 %
Lymphocytes Relative: 17 %
Lymphs Abs: 1.7 10*3/uL (ref 0.7–4.0)
MCH: 30.5 pg (ref 26.0–34.0)
MCHC: 33.3 g/dL (ref 30.0–36.0)
MCV: 91.5 fL (ref 80.0–100.0)
Monocytes Absolute: 0.6 10*3/uL (ref 0.1–1.0)
Monocytes Relative: 6 %
Neutro Abs: 7.9 10*3/uL — ABNORMAL HIGH (ref 1.7–7.7)
Neutrophils Relative %: 75 %
Platelets: 267 10*3/uL (ref 150–400)
RBC: 4.46 MIL/uL (ref 3.87–5.11)
RDW: 11.8 % (ref 11.5–15.5)
WBC: 10.4 10*3/uL (ref 4.0–10.5)
nRBC: 0 % (ref 0.0–0.2)

## 2022-08-26 LAB — BASIC METABOLIC PANEL
Anion gap: 6 (ref 5–15)
BUN: 8 mg/dL (ref 6–20)
CO2: 25 mmol/L (ref 22–32)
Calcium: 9.1 mg/dL (ref 8.9–10.3)
Chloride: 106 mmol/L (ref 98–111)
Creatinine, Ser: 0.65 mg/dL (ref 0.44–1.00)
GFR, Estimated: >60 mL/min (ref >60)
Glucose, Bld: 98 mg/dL (ref 70–99)
Potassium: 3.3 mmol/L — ABNORMAL LOW (ref 3.5–5.1)
Sodium: 137 mmol/L (ref 135–145)

## 2022-08-26 LAB — MAGNESIUM: Magnesium: 1.9 mg/dL (ref 1.7–2.4)

## 2022-08-26 MED ORDER — LORAZEPAM 1 MG PO TABS
1.0000 mg | ORAL_TABLET | Freq: Once | ORAL | Status: AC
  Administered 2022-08-26: 1 mg via ORAL
  Filled 2022-08-26: qty 1

## 2022-08-26 MED ORDER — APIXABAN (ELIQUIS) VTE STARTER PACK (10MG AND 5MG): ORAL_TABLET | ORAL | 0 refills | Status: DC

## 2022-08-26 MED ORDER — APIXABAN (ELIQUIS) EDUCATION KIT FOR DVT/PE PATIENTS: PACK | Freq: Once | Status: DC

## 2022-08-26 MED ORDER — POTASSIUM CHLORIDE CRYS ER 20 MEQ PO TBCR
40.0000 meq | EXTENDED_RELEASE_TABLET | Freq: Once | ORAL | Status: AC
  Administered 2022-08-26: 40 meq via ORAL
  Filled 2022-08-26: qty 2

## 2022-08-26 MED ORDER — APIXABAN 5 MG PO TABS: 5.0000 mg | ORAL_TABLET | Freq: Two times a day (BID) | ORAL | 0 refills | Status: DC

## 2022-08-26 MED ORDER — APIXABAN 5 MG PO TABS
10.0000 mg | ORAL_TABLET | Freq: Once | ORAL | Status: AC
  Administered 2022-08-26: 10 mg via ORAL
  Filled 2022-08-26: qty 2

## 2022-08-26 MED ORDER — IOHEXOL 300 MG/ML  SOLN
100.0000 mL | Freq: Once | INTRAMUSCULAR | Status: AC | PRN
  Administered 2022-08-26: 100 mL via INTRAVENOUS

## 2022-08-26 NOTE — ED Notes (Signed)
Pt ambulatory to bathroom

## 2022-08-26 NOTE — Progress Notes (Signed)
Right upper extremity venous duplex has been completed. Preliminary results can be found in CV Proc through chart review.  Results were given to Dr. Regenia Skeeter.  08/26/22 4:52 PM Ebony Lewis RVT

## 2022-08-26 NOTE — Discharge Instructions (Addendum)
Your ultrasound today shows a DVT (blood clot). You are being given anticoagulation called Eliquis. Do NOT take NSAIDs such as ibuprofen, Advil, Aleve, naproxen, etc.  You may still take Tylenol for any type of pain, fever, discomfort.  If you develop chest pain, shortness of breath, or any other new/concerning symptoms then return to the ER or call 911.

## 2022-08-26 NOTE — ED Triage Notes (Signed)
Right arm notably darker than right. Hands equally cool

## 2022-08-26 NOTE — ED Notes (Signed)
US at bedside

## 2022-08-26 NOTE — ED Notes (Signed)
Dr. Regenia Skeeter at beside

## 2022-08-26 NOTE — ED Triage Notes (Signed)
Pt arrives with right arm numbness and tingling starting a few hours ago, states it feels like there's something tight all around her arm where the picc line is, pain primarily in forearm. Has had it for a month, was possible going to get it out today. No hx of blood clots

## 2022-08-26 NOTE — ED Notes (Signed)
Pt ambulatory to bathroom. Pt is anxious regarding right arm issue and concern for blood clot in right arm where PICC line is. MD notified.

## 2022-08-26 NOTE — ED Notes (Signed)
Pulses remain present in right arm. Right hand is still cool to touch

## 2022-08-26 NOTE — Progress Notes (Signed)
Order to discontinue R arm PICC line. Discontinued intact. Manual pressure x 11 minutes. Vaseline pressure gauze dressing applied. Patient instructed to remain in supine position x 30 minutes post procedure and to remove bandage 24 hours for placement. Site WNL post procedure.

## 2022-08-26 NOTE — ED Provider Notes (Addendum)
[place] EMERGENCY DEPARTMENT AT [facility] Provider Note   CSN: [id number] Arrival date & time: 08/26/22  1342     History  Chief Complaint  Patient presents with   Extremity Weakness    Right arm    Ebony Lewis is a 37 y.o. female.  HPI 37 year old female presents with right arm numbness and tingling.  Seems to wax and wane.  Started about 2 hours prior to arrival, which at the time I am seeing her is about 4 and half hours ago.  No chest pain or shortness of breath.  She has a PICC line in her right upper extremity for IV antibiotics.  Is been working fine.  She is having some axillary pain and was seen here almost a week and a half ago for that and had a negative DVT ultrasound.  However she is concerned about a DVT on the lateral aspect of her upper arm she is also been having some pain and discomfort in her upper bicep on the lateral aspect.  No swelling to her arm.  No weakness.  No chest pain or shortness of breath.  She states that the times today she has seen her arm turned blue, primarily over the elbow and distal upper arm and proximal lower arm.  Home Medications Prior to Admission medications   Medication Sig Start Date End Date Taking? Authorizing Provider  apixaban (ELIQUIS) 5 MG TABS tablet Take 1 tablet (5 mg total) by mouth 2 (two) times daily. start with two-69m tablets twice daily for 7 days. On day 8, switch to one-585mtablet twice daily. 08/26/22  Yes GoSherwood GamblerMD  cetirizine (ZYRTEC) 10 MG tablet Take 10 mg by mouth daily.    [provider]  oxyCODONE-acetaminophen (PERCOCET) 5-325 MG tablet Take 1 tablet by mouth every 6 (six) hours as needed for severe pain. 05/08/22 05/08/23  JaWill BonnetMD  VENTOLIN HFA 108 ([id number]) MCG/ACT inhaler INHALE 2 PUFFS INTO THE LUNGS AS NEEDED. 06/27/17   Nahser, PhWonda ChengMD      Allergies    Influenza virus vaccine, Bactrim [sulfamethoxazole-trimethoprim], and Amoxicillin    Review of  Systems   Review of Systems  Respiratory:  Negative for shortness of breath.   Cardiovascular:  Negative for chest pain.  Musculoskeletal:  Positive for myalgias.  Skin:  Positive for color change.  Neurological:  Positive for numbness.    Physical Exam Updated Vital Signs BP 112/75   Pulse 73   Temp 98.1 F (36.7 C) (Oral)   Resp 17   LMP  (LMP Unknown)   SpO2 99%  Physical Exam Vitals and nursing note reviewed.  Constitutional:      Appearance: She is well-developed.  HENT:     Head: Normocephalic and atraumatic.  Cardiovascular:     Rate and Rhythm: Normal rate and regular rhythm.     Pulses:          Radial pulses are 2+ on the right side.  Pulmonary:     Effort: Pulmonary effort is normal.  Abdominal:     General: There is no distension.  Musculoskeletal:     Comments: Right upper extremity appears symmetric in size to the left. The right PICC is in place without tenderness or swelling or erythema. Mild tenderness to right lateral upper arm just inferior to shoulder. No swelling, bruising. Right hand and forearm/upper arm with intact sensation compared to left. Normal strength in right hand  Skin:  General: Skin is warm and dry.  Neurological:     Mental Status: She is alert.  Psychiatric:        Mood and Affect: Mood is anxious.     ED Results / Procedures / Treatments   Labs (all labs ordered are listed, but only abnormal results are displayed) Labs Reviewed  BASIC METABOLIC PANEL - Abnormal; Notable for the following components:      Result Value   Potassium 3.3 (*)    All other components within normal limits  CBC WITH DIFFERENTIAL/PLATELET - Abnormal; Notable for the following components:   Neutro Abs 7.9 (*)    All other components within normal limits  MAGNESIUM    EKG None  Radiology CT ANGIO UP EXTREM RIGHT W &/OR WO CONTRAST  Result Date: 08/26/2022 CLINICAL DATA:  Diminished pulses. Right arm numbness and tingling. PICC line in place.  EXAM: CT ANGIOGRAPHY UPPER RIGHT EXTREMITY CONTRAST:  173m OMNIPAQUE IOHEXOL 300 MG/ML  SOLN COMPARISON:  None Available. FINDINGS: No evidence of arterial occlusive disease. Radial and ulnar arteries are patent all the way into the hand. PICC line in place entering in the midportion of the upper arm. No complicating feature in the portion imaged. Soft tissues of the region appear normal. No evidence of abscess or unexpected finding. Bones and joints of the region appear normal. Review of the MIP images confirms the above findings. IMPRESSION: Normal examination. No evidence of arterial occlusive disease. PICC line in place without complicating feature. Electronically Signed   By: MNelson ChimesM.D.   On: 08/26/2022 18:14   UE VENOUS DUPLEX (7am - 7pm)  Result Date: 08/26/2022 UPPER VENOUS STUDY  Patient Name:  Ebony Lewis Date of Exam:   08/26/2022 Medical Rec #: [id number]      Accession #:    [id number] of Birth: [birth year]      Patient Gender: F Patient Age:   89years [facility]      VAS [place]PPER EXTREMITY VENOUS DUPLEX Referring Phys: SNicki ReaperGOLDSTON --------------------------------------------------------------------------------  Indications: Swelling Risk Factors: None identified. Limitations: PICC Line. Comparison Study: 08/16/2022 - Right:                   No evidence of deep vein thrombosis in the upper extremity. No                   evidence of                   superficial vein thrombosis in the upper extremity. No                   evidence of                   thrombosis in                   the subclavian. Performing Technologist: GOliver HumRVT  Examination Guidelines: A complete evaluation includes B-mode imaging, spectral Doppler, color Doppler, and power Doppler as needed of all accessible portions of each vessel. Bilateral testing is considered an integral part of a complete examination. Limited examinations for reoccurring indications may be  performed as noted.  Right Findings: +----------+------------+---------+-----------+----------+-------+ RIGHT     CompressiblePhasicitySpontaneousPropertiesSummary +----------+------------+---------+-----------+----------+-------+ IJV           Full       Yes       Yes                      +----------+------------+---------+-----------+----------+-------+  Subclavian  Partial      Yes       Yes               Acute  +----------+------------+---------+-----------+----------+-------+ Axillary      Full       Yes       Yes                      +----------+------------+---------+-----------+----------+-------+ Brachial      Full       Yes       Yes                      +----------+------------+---------+-----------+----------+-------+ Radial        Full                                          +----------+------------+---------+-----------+----------+-------+ Ulnar         Full                                          +----------+------------+---------+-----------+----------+-------+ Cephalic      Full                                          +----------+------------+---------+-----------+----------+-------+ Basilic       Full                                          +----------+------------+---------+-----------+----------+-------+  Left Findings: +----------+------------+---------+-----------+----------+-------+ LEFT      CompressiblePhasicitySpontaneousPropertiesSummary +----------+------------+---------+-----------+----------+-------+ Subclavian    Full       Yes       Yes                      +----------+------------+---------+-----------+----------+-------+  Summary:  Right: No evidence of superficial vein thrombosis in the upper extremity. Findings consistent with acute deep vein thrombosis involving the right subclavian vein.  Left: No evidence of thrombosis in the subclavian.  *See table(s) above for measurements and observations.     Preliminary    DG Chest Port 1 View  Result Date: 08/26/2022 CLINICAL DATA:  Evaluate PICC line position. EXAM: PORTABLE CHEST 1 VIEW COMPARISON:  Chest radiograph 08/16/2022 FINDINGS: The PICC line tip is unchanged in position projecting over the superior SVC. The PICC line tubing appears intact. The heart size and mediastinal contours are within normal limits. The lungs are clear. No pneumothorax or pleural effusion. No acute finding in the visualized skeleton. IMPRESSION: Unchanged position of the PICC line with tip projecting over the superior SVC. Electronically Signed   By: Audie Pinto M.D.   On: 08/26/2022 14:30    Procedures Procedures    Medications Ordered in ED Medications  potassium chloride SA (KLOR-CON M) CR tablet 40 mEq (40 mEq Oral Given 08/26/22 1721)  iohexol (OMNIPAQUE) 300 MG/ML solution 100 mL (100 mLs Intravenous Contrast Given 08/26/22 1749)  apixaban (ELIQUIS) tablet 10 mg (10 mg Oral Given 08/26/22 1955)    ED Course/ Medical Decision Making/ A&P  Medical Decision Making Amount and/or Complexity of Data Reviewed Labs: ordered.    Details: Mild hypokalemia.  Otherwise hemoglobin and WBC and magnesium are normal. Radiology: ordered and independent interpretation performed.    Details: CTA without arterial occlusion. DVT study shows a small thrombus in the subclavian vein  Risk Prescription drug management.   Given the concern of her arm turning blue despite a normal pulse, CTA was obtained but is unremarkable.  DVT ultrasound does seem to show a catheter associated DVT.  Discussed with Dr. Virl Cagey who recommends catheter removal and anticoagulation.  Discussed with the patient, she is otherwise well-appearing, has normal vitals, and no chest pain/shortness of breath.  She notes that she still has the PICC line though that is because her provider is waiting on a final biopsy to see if she will need further treatments.  However right  now she is not currently using it for treatments and would like it to be removed which I think is reasonable.  Will give her her first dose of Eliquis here.  Will give return precautions but she otherwise appears stable for discharge.  No history of significant bleeding.  Prior to discharge patient is asking for anxiety medicine. She has had a lot going on in her life and stressors and feels like it's all hitting at once. Is asking for something po such as ativan x 1. Will give a dose and have her follow up with PCP.      Final Clinical Impression(s) / ED Diagnoses Final diagnoses:  Complication associated with peripherally inserted central catheter (PICC), initial encounter  Acute deep vein thrombosis (DVT) of other vein of right upper extremity ([place])    Rx / DC Orders ED Discharge Orders          Ordered    APIXABAN (ELIQUIS) VTE STARTER PACK (10MG AND 5MG)  Status:  Discontinued        08/26/22 1929    apixaban (ELIQUIS) 5 MG TABS tablet  2 times daily        08/26/22 1948              Sherwood Gambler, MD 08/26/22 2018    Sherwood Gambler, MD 08/26/22 2055

## 2022-08-27 ENCOUNTER — Other Ambulatory Visit (HOSPITAL_COMMUNITY): Payer: Self-pay

## 2022-08-29 ENCOUNTER — Ambulatory Visit (HOSPITAL_COMMUNITY)
Admission: RE | Admit: 2022-08-29 | Discharge: 2022-08-29 | Disposition: A | Discharge status: Home / Self Care | Payer: 59 | Source: Ambulatory Visit | Attending: Vascular Surgery | Admitting: Vascular Surgery

## 2022-08-29 VITALS — BP 111/83 | HR 76

## 2022-08-29 DIAGNOSIS — I82621 Acute embolism and thrombosis of deep veins of right upper extremity: Secondary | ICD-10-CM

## 2022-08-29 DIAGNOSIS — I82409 Acute embolism and thrombosis of unspecified deep veins of unspecified lower extremity: Secondary | ICD-10-CM | POA: Insufficient documentation

## 2022-08-29 MED ORDER — APIXABAN 5 MG PO TABS: 5.0000 mg | ORAL_TABLET | Freq: Two times a day (BID) | ORAL | 1 refills | Status: DC

## 2022-08-29 NOTE — Patient Instructions (Signed)
-  Continue Eliquis 10 mg (2 tablets) twice daily to complete 7 days then decrease to 5 mg (1 tablet) for the remainder of your treatment.  -Your refills have been sent to [facility]. You will likely need to call the pharmacy to ask them to fill this when you start to run low on your current supply.  -It is important to take your medication around the same time every day.  -Avoid NSAIDs like ibuprofen (Advil, Motrin) and naproxen (Aleve) as well as aspirin doses over 100 mg daily. -Tylenol (acetaminophen) is the preferred over the counter pain medication to lower the risk of bleeding. -Be sure to alert all of your health care providers that you are taking an anticoagulant prior to starting a new medication or having a procedure. -Monitor for signs and symptoms of bleeding (abnormal bruising, prolonged bleeding, nose bleeds, bleeding from gums, discolored urine, black tarry stools). If you have fallen and hit your head OR if your bleeding is severe or not stopping, seek emergency care.  -Go to the emergency room if emergent signs and symptoms of new clot occur (new or worse swelling and pain in an arm or leg, shortness of breath, chest pain, fast or irregular heartbeats, lightheadedness, dizziness, fainting, coughing up blood) or if you experience a significant color change (pale or blue) in the extremity that has the DVT.   Your next visit is on March 14 at [facility] [place], [place], [place] [address] Enter the hospital through Entrance [address] off [facility] and pull up to the [place] entrance to the free [place] parking.  Check in for your appointment at the [facility] [place].   If you have any questions or need to reschedule an appointment, please call [phone] [facility].  If you are having an emergency, call 911 or present to the nearest emergency room.   What is a DVT?  -Deep vein thrombosis (DVT) is a condition in which  a blood clot forms in a vein of the deep venous system which can occur in the lower leg, thigh, pelvis, arm, or neck. This condition is serious and can be life-threatening if the clot travels to the arteries of the lungs and causing a blockage (pulmonary embolism, PE). A DVT can also damage veins in the leg, which can lead to long-term venous disease, leg pain, swelling, discoloration, and ulcers or sores (post-thrombotic syndrome).  -Treatment may include taking an anticoagulant medication to prevent more clots from forming and the current clot from growing, wearing compression stockings, and/or surgical procedures to remove or dissolve the clot.

## 2022-08-29 NOTE — Progress Notes (Signed)
DVT Clinic Note  Name: Ebony Lewis     MRN: [id number]     DOB: 1985-09-13     Sex: female  PCP: Rhea Bleacher, NP  Today's Visit: Visit Information: Initial Visit  Referred to CPP by: Dr. Virl Cagey Reason for referral:  Chief Complaint  Patient presents with   Med Management - DVT   HISTORY OF PRESENT ILLNESS:  Ebony Lewis is a 37 y.o. female who presents after diagnosis of DVT for medication management. Patient had been receiving IV antibiotics through a PICC line that was placed 07/31/22 in her R arm for chronic endometritis. Around the same time she was started on high dose Prometrium to try to stop her menstrual cycle, which the patient self-discontinued after DVT diagnosis. Notes she sees her gynecologist in 2 weeks. She started having some muscle pain and weakness in her upper arm and went to the ED 08/15/22 but was not found to have a DVT. She then started noticing more heaviness, swelling, and bluish color at times in her arm as well as lymph node swelling and returned to the ED 08/26/22 at which time she was found to have DVT involving her R subclavian vein. The ED provider consulted Dr. Virl Cagey who recommended catheter removal and anticoagulation with follow up medication management in DVT Clinic. Her PICC line was removed at that time, and Eliquis was started.   Today, patient reports adherence with Eliquis. No abnormal bleeding or bruising. Has noticed her swelling has gone down. Endorses mild to moderate pain and tingling in the R arm. She does have some tingling at baseline. Did not have any trouble affording Eliquis at her pharmacy.   Positive Thrombotic Risk Factors: Recent surgery (within 3 months), Central venous catheterization, Other (comment) (high dose progesterone) Bleeding Risk Factors: Anticoagulant therapy  Negative Thrombotic Risk Factors: Previous VTE, Recent trauma (within 3 months), Estrogen therapy, Testosterone therapy, Older age, Obesity, Active cancer, Recent  cesarean section (within 3 months), Sedentary journey lasting >8 hours within 4 weeks, Erythropoiesis-stimulating agent, Smoking, Recent COVID diagnosis (within 3 months), Within 6 weeks postpartum, Bed rest >72 hours within 3 months, Paralysis, paresis, or recent plaster cast immobilization of lower extremity, Pregnancy, Recent admission to hospital with acute illness (within 3 months), Non-malignant, chronic inflammatory condition, Known thrombophilic condition  Rx Insurance Coverage: Commercial Rx Affordability: Eliquis $10/month Preferred Pharmacy: [facility]  Past Medical History:  Diagnosis Date   Allergy    Anemia    Anxiety    Pt states PTSD s/p post delivery hemorrhage.   Asthma    Blood transfusion without reported diagnosis    BRCA negative 01/2017   Depression    Family history of breast cancer    Genetic testing of female 01/2017   MyRisk neg; IBIS=22.9%/riskscore=34.6%   Heart murmur    Increased risk of breast cancer 01/2017   IBIS=22.9%/riskscore=34.6%   Pneumonia    Renal cyst     Past Surgical History:  Procedure Laterality Date   BREAST BIOPSY Right 08/13/2018   PSEUDO-ANGIOMATOUS STROMAL HYPERPLASIA AND FOCAL ASSOCIATED    DILATION AND CURETTAGE OF UTERUS     EMERGENCY   ENDOSCOPIC TURBINATE REDUCTION Bilateral 11/30/2014   Procedure: ENDOSCOPIC TURBINATE REDUCTION;  Surgeon: Carloyn Manner, MD;  Location: [facility];  Service: ENT;  Laterality: Bilateral;   FRONTAL SINUS EXPLORATION Left 11/30/2014   Procedure: FRONTAL SINUS EXPLORATION;  Surgeon: Carloyn Manner, MD;  Location: [place];  Service: ENT;  Laterality: Left;   HYSTEROSCOPY  WITH D & C N/A 05/08/2022   Procedure: DILATATION AND CURETTAGE /HYSTEROSCOPY;  Surgeon: Will Bonnet, MD;  Location: [facility];  Service: Gynecology;  Laterality: N/A;   IMAGE GUIDED SINUS SURGERY N/A 11/30/2014   Procedure: IMAGE GUIDED SINUS SURGERY;  Surgeon: Carloyn Manner, MD;   Location: [facility];  Service: ENT;  Laterality: N/A;   NASAL SINUS SURGERY  11/2014   SEPTOPLASTY WITH ETHMOIDECTOMY, AND MAXILLARY ANTROSTOMY Bilateral 11/30/2014   Procedure: SEPTOPLASTY WITH ETHMOIDECTOMY, AND MAXILLARY ANTROSTOMY;  Surgeon: Carloyn Manner, MD;  Location: [facility];  Service: ENT;  Laterality: Bilateral;   SPHENOIDECTOMY Bilateral 11/30/2014   Procedure: Coralee Pesa;  Surgeon: Carloyn Manner, MD;  Location: [facility];  Service: ENT;  Laterality: Bilateral;   TUBAL LIGATION     XI ROBOT ASSISTED DIAGNOSTIC LAPAROSCOPY N/A 05/08/2022   Procedure: XI ROBOT ASSISTED DIAGNOSTIC LAPAROSCOPY, BILATERAL SALPINGECTOMY;  Surgeon: Will Bonnet, MD;  Location: [facility];  Service: Gynecology;  Laterality: N/A;    Social History   Socioeconomic History   Marital status: Married    Spouse name: ,Shanon Brow   Number of children: 2   Years of education: Not on file   Highest education level: Not on file  Occupational History   Not on file  Tobacco Use   Smoking status: Former    Types: Cigarettes    Quit date: 11/13/2011    Years since quitting: 10.8   Smokeless tobacco: Never  Vaping Use   Vaping Use: Never used  Substance and Sexual Activity   Alcohol use: Yes    Alcohol/week: 1.0 standard drink of alcohol    Types: 1 Glasses of wine per week    Comment: Socially    Drug use: No   Sexual activity: Yes    Partners: Male    Birth control/protection: Surgical    Comment: Fiance  Other Topics Concern   Not on file  Social History Narrative   [employer] 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    2 Children- both female (age 94 and 89 months)    Enjoys being with children   Social Determinants of Health   Financial Resource Strain: Not on file  Food Insecurity: Not on file  Transportation Needs: Not on file  Physical Activity: Not on file  Stress: Not on file  Social Connections: Not on file  Intimate Partner Violence: Not on file     Family History  Problem Relation Age of Onset   Heart murmur Father    Hypertension Father    Anxiety disorder Father    Hyperlipidemia Father    Heart disease Father        stent    Coronary artery disease Maternal Grandmother    Fibromyalgia Maternal Grandmother    Cancer Maternal Grandmother 22       Melanoma, cervical, ovarian   Cancer Mother 57       Lung Cancer--question mets from breast   Diabetes Paternal Grandfather    Heart disease Paternal Grandfather    Diabetes Maternal Grandfather    Hypertension Maternal Grandfather    Cancer Paternal Grandmother        Stage III lung cancer   Multiple sclerosis Paternal Grandmother    Breast cancer Other 57   Cancer Other        blood; tested positive for breast cancer genetic mutation--? type    Allergies as of 08/29/2022 - Review Complete 08/29/2022  Allergen Reaction Noted   Influenza virus vaccine Anaphylaxis 08/15/2022  Bactrim [sulfamethoxazole-trimethoprim] Hives 04/02/2016   Amoxicillin Diarrhea and Rash 10/02/2014    Current Outpatient Medications on File Prior to Encounter  Medication Sig Dispense Refill   apixaban (ELIQUIS) 5 MG TABS tablet Take 1 tablet (5 mg total) by mouth 2 (two) times daily. start with two-32m tablets twice daily for 7 days. On day 8, switch to one-564mtablet twice daily. 74 tablet 0   levofloxacin (LEVAQUIN) 500 MG tablet Take 500 mg by mouth daily.     LORazepam (ATIVAN) 1 MG tablet Take 1 mg by mouth daily as needed for anxiety.     ondansetron (ZOFRAN) 4 MG tablet Take 4 mg by mouth daily as needed for nausea or vomiting.     valACYclovir (VALTREX) 1000 MG tablet Take 1,000 mg by mouth daily as needed (cold sores).     VENTOLIN HFA 108 (90 Base) MCG/ACT inhaler INHALE 2 PUFFS INTO THE LUNGS AS NEEDED. 18 g 6   Vitamin E 670 MG (1000 UT) CAPS Take 1 capsule by mouth daily.     PROMETRIUM 200 MG capsule Take 300 mg by mouth daily. (Patient not taking: Reported on 08/29/2022)     No current  facility-administered medications on file prior to encounter.   REVIEW OF SYSTEMS:  Review of Systems  Respiratory:  Negative for shortness of breath.   Cardiovascular:  Negative for chest pain and palpitations.  Musculoskeletal:  Negative for myalgias.  Neurological:  Positive for dizziness and tingling.   PHYSICAL EXAMINATION:  Vitals:   08/29/22 0928  BP: 111/83  Pulse: 76  SpO2: 100%    Physical Exam Vitals reviewed.  Cardiovascular:     Rate and Rhythm: Normal rate.  Pulmonary:     Effort: Pulmonary effort is normal.  Musculoskeletal:        General: No swelling or tenderness.  Psychiatric:        Mood and Affect: Mood normal.        Behavior: Behavior normal.        Thought Content: Thought content normal.   LABS:  CBC     Component Value Date/Time   WBC 10.4 08/26/2022 1638   RBC 4.46 08/26/2022 1638   HGB 13.6 08/26/2022 1638   HGB WILL FOLLOW 03/10/2017 1027   HGB 13.7 03/10/2017 1027   HCT 40.8 08/26/2022 1638   HCT WILL FOLLOW 03/10/2017 1027   HCT 40.3 03/10/2017 1027   PLT 267 08/26/2022 1638   PLT WILL FOLLOW 03/10/2017 1027   PLT 322 03/10/2017 1027   MCV 91.5 08/26/2022 1638   MCV WILL FOLLOW 03/10/2017 1027   MCV 89 03/10/2017 1027   MCV 91 12/25/2013 2259   MCH 30.5 08/26/2022 1638   MCHC 33.3 08/26/2022 1638   RDW 11.8 08/26/2022 1638   RDW WILL FOLLOW 03/10/2017 1027   RDW 12.6 03/10/2017 1027   RDW 12.6 12/25/2013 2259   LYMPHSABS 1.7 08/26/2022 1638   LYMPHSABS WILL FOLLOW 03/10/2017 1027   LYMPHSABS 2.9 03/10/2017 1027   LYMPHSABS 2.2 02/27/2013 1311   MONOABS 0.6 08/26/2022 1638   MONOABS 1.6 (H) 02/27/2013 1311   EOSABS 0.1 08/26/2022 1638   EOSABS WILL FOLLOW 03/10/2017 1027   EOSABS 0.6 (H) 03/10/2017 1027   EOSABS 0.0 02/27/2013 1311   BASOSABS 0.1 08/26/2022 1638   BASOSABS WILL FOLLOW 03/10/2017 1027   BASOSABS 0.1 03/10/2017 1027   BASOSABS 0.0 02/27/2013 1311    Hepatic Function      Component Value Date/Time    PROT 7.6  08/15/2022 2342   PROT 7.0 03/10/2017 1027   PROT 7.5 12/24/2013 1212   ALBUMIN 4.6 08/15/2022 2342   ALBUMIN 4.1 12/24/2013 1212   AST 23 08/15/2022 2342   AST 25 12/24/2013 1212   ALT 14 08/15/2022 2342   ALT 18 12/24/2013 1212   ALKPHOS 47 08/15/2022 2342   ALKPHOS 90 12/24/2013 1212   BILITOT 0.7 08/15/2022 2342   BILITOT 0.5 12/24/2013 1212   BILIDIR 0.1 08/23/2014 1042    Renal Function   Lab Results  Component Value Date   CREATININE 0.65 08/26/2022   CREATININE 0.93 08/15/2022   CREATININE 0.79 08/23/2021    Estimated Creatinine Clearance: 103.6 mL/min (by C-G formula based on SCr of 0.65 mg/dL).   [facility] Vascular Lab Studies:  08/26/22 VAS [place] UPPER EXTREMITY VENOUS DUPLEX RIGHT   Right:  No evidence of superficial vein thrombosis in the upper extremity.  Findings consistent with acute deep vein thrombosis involving the right subclavian  vein.    Left:  No evidence of thrombosis in the subclavian.   ASSESSMENT: Location of DVT: Right upper extremity Cause of DVT: provoked by a transient risk factor - PICC line (removed 08/26/22)  PLAN: -Continue apixaban (Eliquis) 10 mg twice daily for 7 days followed by 5 mg twice daily. -Expected duration of therapy: 3 months s/p catheter removal (08/26/22). Therapy started on 08/26/22. -Patient educated on purpose, proper use and potential adverse effects of apixaban (Eliquis). -Discussed importance of taking medication around the same time every day. -Advised patient of medications to avoid (NSAIDs, aspirin doses >100 mg daily). -Educated that Tylenol (acetaminophen) is the preferred analgesic to lower the risk of bleeding. -Advised patient to alert all providers of anticoagulation therapy prior to starting a new medication or having a procedure. -Emphasized importance of monitoring for signs and symptoms of bleeding (abnormal bruising, prolonged bleeding, nose bleeds, bleeding from gums, discolored urine, black tarry  stools). -Educated patient to present to the ED if emergent signs and symptoms of new thrombosis occur. -Answered all of the patient's questions.   Follow up: 1 month in DVT [place], PharmD, [place], CPP Deep Vein Thrombosis Clinic Clinical Pharmacist Practitioner Office: [phone]

## 2022-09-11 ENCOUNTER — Other Ambulatory Visit: Payer: Self-pay | Admitting: Obstetrics and Gynecology

## 2022-09-11 DIAGNOSIS — R599 Enlarged lymph nodes, unspecified: Secondary | ICD-10-CM

## 2022-09-25 ENCOUNTER — Other Ambulatory Visit: Payer: 59

## 2022-09-26 ENCOUNTER — Ambulatory Visit (HOSPITAL_COMMUNITY): Payer: 59

## 2022-09-27 ENCOUNTER — Encounter: Payer: Self-pay | Admitting: Obstetrics and Gynecology

## 2022-09-30 ENCOUNTER — Encounter (HOSPITAL_COMMUNITY): Payer: Self-pay

## 2022-09-30 ENCOUNTER — Ambulatory Visit (HOSPITAL_COMMUNITY)
Admission: RE | Admit: 2022-09-30 | Discharge: 2022-09-30 | Disposition: A | Discharge status: Home / Self Care | Payer: 59 | Source: Ambulatory Visit | Attending: Vascular Surgery | Admitting: Vascular Surgery

## 2022-09-30 VITALS — BP 112/74 | HR 74

## 2022-09-30 DIAGNOSIS — I82621 Acute embolism and thrombosis of deep veins of right upper extremity: Secondary | ICD-10-CM | POA: Insufficient documentation

## 2022-09-30 NOTE — Patient Instructions (Signed)
-  Continue apixaban (Eliquis) 5 mg twice daily. -It is important to take your medication around the same time every day.  -Avoid NSAIDs like ibuprofen (Advil, Motrin) and naproxen (Aleve) as well as aspirin doses over 100 mg daily. -Tylenol (acetaminophen) is the preferred over the counter pain medication to lower the risk of bleeding. -Be sure to alert all of your health care providers that you are taking an anticoagulant prior to starting a new medication or having a procedure. -Monitor for signs and symptoms of bleeding (abnormal bruising, prolonged bleeding, nose bleeds, bleeding from gums, discolored urine, black tarry stools). If you have fallen and hit your head OR if your bleeding is severe or not stopping, seek emergency care.  -Go to the emergency room if emergent signs and symptoms of new clot occur (new or worse swelling and pain in an arm or leg, shortness of breath, chest pain, fast or irregular heartbeats, lightheadedness, dizziness, fainting, coughing up blood) or if you experience a significant color change (pale or blue) in the extremity that has the DVT.   Your next visit will be in 2 months. I'll call you to make this.  [facility] [place], [place], [place] [address] Enter the hospital through [address]tr[address] off [address] and pull up to the [place] entrance to the free [place] parking.  Check in for your appointment at the [facility].   If you have any questions or need to reschedule an appointment, please call [phone] [facility].  If you are having an emergency, call 911 or present to the nearest emergency room.   What is a DVT?  -Deep vein thrombosis (DVT) is a condition in which a blood clot forms in a vein of the deep venous system which can occur in the lower leg, thigh, pelvis, arm, or neck. This condition is serious and can be life-threatening if the clot travels to the arteries of the lungs and causing a  blockage (pulmonary embolism, PE). A DVT can also damage veins in the leg, which can lead to long-term venous disease, leg pain, swelling, discoloration, and ulcers or sores (post-thrombotic syndrome).  -Treatment may include taking an anticoagulant medication to prevent more clots from forming and the current clot from growing, wearing compression stockings, and/or surgical procedures to remove or dissolve the clot.

## 2022-09-30 NOTE — Progress Notes (Signed)
DVT Clinic Note  Name: Ebony Lewis     MRN: [id number]     DOB: Jul 20, 1985     Sex: female  PCP: Rhea Bleacher, NP  Today's Visit: Visit Information: Follow Up Visit  Referred to CPP by: Dr. Carlis Abbott Reason for referral:  Chief Complaint  Patient presents with   Med Management - DVT   HISTORY OF PRESENT ILLNESS:  Ebony Lewis is a 37 y.o. 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 Patient had been receiving IV antibiotics through a PICC line that was placed 07/31/22 in her R arm for chronic endometritis. Around the same time she was started on high dose Prometrium to try to stop her menstrual cycle, which the patient self-discontinued after DVT diagnosis. Dr. Virl Cagey was consulted in the ED 08/26/22 at diagnosis who recommended catheter removal and anticoagulation with follow up medication management in the DVT Clinic. Her PICC line was removed at that time, and Eliquis was started. Last seen in DVT Clinic 08/29/22 at which time her swelling as improved and Eliquis was continued.   Today patient reports her symptoms have improved but she does still intermittently have pain in her arm which she describes as a deep muscle pain. Endorses tingling of her R hand. She says she isn't sure if this is related to the blood clot or from taking frequent antibiotics including high dose fluoroquinolones as she did have some tingling at baseline. She plans to ask her PCP for a repeat US of her arm as this would give her peace of mind prior to coming off Eliquis. Denies abnormal bleeding or bruising. Reports she is thinking about getting otox and lip filler but the doctor would need to hold Eliquis, asks if this is okay.   Positive Thrombotic Risk Factors: Recent surgery (within 3 months), Other (comment), Central venous catheterization (high dose progesterone started shortly before DVT diagnosis) Bleeding Risk Factors: Anticoagulant  therapy  Negative Thrombotic Risk Factors: Recent trauma (within 3 months), Recent admission to hospital with acute illness (within 3 months), Paralysis, paresis, or recent plaster cast immobilization of lower extremity, Bed rest >72 hours within 3 months, Sedentary journey lasting >8 hours within 4 weeks, Pregnancy, Estrogen therapy, Testosterone therapy, Active cancer, Smoking, Obesity, Older age, Known thrombophilic condition, Recent cesarean section (within 3 months), Recent COVID diagnosis (within 3 months), Non-malignant, chronic inflammatory condition, Erythropoiesis-stimulating agent, Within 6 weeks postpartum  Rx Insurance Coverage: Commercial Rx Affordability: Eliquis is $10/month Preferred Pharmacy: Refills have been sent to [facility]  Past Medical History:  Diagnosis Date   Allergy    Anemia    Anxiety    Pt states PTSD s/p post delivery hemorrhage.   Asthma    Blood transfusion without reported diagnosis    BRCA negative 01/2017   Depression    Family history of breast cancer    Genetic testing of female 01/2017   MyRisk neg; IBIS=22.9%/riskscore=34.6%   Heart murmur    Increased risk of breast cancer 01/2017   IBIS=22.9%/riskscore=34.6%   Pneumonia    Renal cyst     Past Surgical History:  Procedure Laterality Date   BREAST BIOPSY Right 08/13/2018   PSEUDO-ANGIOMATOUS STROMAL HYPERPLASIA AND FOCAL ASSOCIATED    DILATION AND CURETTAGE OF UTERUS     EMERGENCY   ENDOSCOPIC TURBINATE REDUCTION Bilateral 11/30/2014   Procedure: ENDOSCOPIC TURBINATE REDUCTION;  Surgeon: Carloyn Manner, MD;  Location: [facility];  Service: ENT;  Laterality: Bilateral;   FRONTAL SINUS EXPLORATION Left 11/30/2014   Procedure: FRONTAL SINUS EXPLORATION;  Surgeon: Carloyn Manner, MD;  Location: [place];  Service: ENT;  Laterality: Left;   HYSTEROSCOPY WITH D & C N/A 05/08/2022   Procedure: DILATATION AND CURETTAGE /HYSTEROSCOPY;  Surgeon: Will Bonnet,  MD;  Location: [facility];  Service: Gynecology;  Laterality: N/A;   IMAGE GUIDED SINUS SURGERY N/A 11/30/2014   Procedure: IMAGE GUIDED SINUS SURGERY;  Surgeon: Carloyn Manner, MD;  Location: [place];  Service: ENT;  Laterality: N/A;   NASAL SINUS SURGERY  11/2014   SEPTOPLASTY WITH ETHMOIDECTOMY, AND MAXILLARY ANTROSTOMY Bilateral 11/30/2014   Procedure: SEPTOPLASTY WITH ETHMOIDECTOMY, AND MAXILLARY ANTROSTOMY;  Surgeon: Carloyn Manner, MD;  Location: [facility];  Service: ENT;  Laterality: Bilateral;   SPHENOIDECTOMY Bilateral 11/30/2014   Procedure: Coralee Pesa;  Surgeon: Carloyn Manner, MD;  Location: [place];  Service: ENT;  Laterality: Bilateral;   TUBAL LIGATION     XI ROBOT ASSISTED DIAGNOSTIC LAPAROSCOPY N/A 05/08/2022   Procedure: XI ROBOT ASSISTED DIAGNOSTIC LAPAROSCOPY, BILATERAL SALPINGECTOMY;  Surgeon: Will Bonnet, MD;  Location: [facility];  Service: Gynecology;  Laterality: N/A;    Social History   Socioeconomic History   Marital status: Married    Spouse name: ,Shanon Brow   Number of children: 2   Years of education: Not on file   Highest education level: Not on file  Occupational History   Not on file  Tobacco Use   Smoking status: Former    Types: Cigarettes    Quit date: 11/13/2011    Years since quitting: 10.8   Smokeless tobacco: Never  Vaping Use   Vaping Use: Never used  Substance and Sexual Activity   Alcohol use: Yes    Alcohol/week: 1.0 standard drink of alcohol    Types: 1 Glasses of wine per week    Comment: Socially    Drug use: No   Sexual activity: Yes    Partners: Male    Birth control/protection: Surgical    Comment: Fiance  Other Topics Concern   Not on file  Social History Narrative   [employer] 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    2 Children- both female (age 67 and 61 months)    Enjoys being with children   Social Determinants of Health   Financial Resource Strain: Not on file  Food  Insecurity: Not on file  Transportation Needs: Not on file  Physical Activity: Not on file  Stress: Not on file  Social Connections: Not on file  Intimate Partner Violence: Not on file    Family History  Problem Relation Age of Onset   Heart murmur Father    Hypertension Father    Anxiety disorder Father    Hyperlipidemia Father    Heart disease Father        stent    Coronary artery disease Maternal Grandmother    Fibromyalgia Maternal Grandmother    Cancer Maternal Grandmother 22       Melanoma, cervical, ovarian   Cancer Mother 47       Lung Cancer--question mets from breast   Diabetes Paternal Grandfather    Heart disease Paternal Grandfather    Diabetes Maternal Grandfather    Hypertension Maternal Grandfather    Cancer Paternal Grandmother        Stage III lung cancer   Multiple sclerosis Paternal Grandmother    Breast cancer Other 23   Cancer Other  blood; tested positive for breast cancer genetic mutation--? type    Allergies as of 09/30/2022 - Review Complete 09/30/2022  Allergen Reaction Noted   Influenza virus vaccine Anaphylaxis 08/15/2022   Bactrim [sulfamethoxazole-trimethoprim] Hives 04/02/2016   Amoxicillin Diarrhea and Rash 10/02/2014    Current Outpatient Medications on File Prior to Encounter  Medication Sig Dispense Refill   aluminum chloride (DRYSOL) 20 % external solution Apply 1 Application topically daily as needed.     apixaban (ELIQUIS) 5 MG TABS tablet Take 1 tablet (5 mg total) by mouth 2 (two) times daily. Start taking after completion of starter pack. 60 tablet 1   clindamycin (CLEOCIN T) 1 % lotion Apply 1 Application topically daily as needed.     LORazepam (ATIVAN) 1 MG tablet Take 1 mg by mouth daily as needed for anxiety.     ondansetron (ZOFRAN) 4 MG tablet Take 4 mg by mouth daily as needed for nausea or vomiting.     valACYclovir (VALTREX) 1000 MG tablet Take 1,000 mg by mouth daily as needed (cold sores).     VENTOLIN HFA 108  (90 Base) MCG/ACT inhaler INHALE 2 PUFFS INTO THE LUNGS AS NEEDED. 18 g 6   No current facility-administered medications on file prior to encounter.   REVIEW OF SYSTEMS:  Review of Systems  Respiratory:  Negative for shortness of breath.   Cardiovascular:  Negative for chest pain and palpitations.  Musculoskeletal:  Positive for myalgias.  Neurological:  Positive for tingling.   PHYSICAL EXAMINATION:  Vitals:   09/30/22 0855  BP: 112/74  Pulse: 74  SpO2: 98%    Physical Exam Vitals reviewed.  Cardiovascular:     Rate and Rhythm: Normal rate.  Pulmonary:     Effort: Pulmonary effort is normal.  Psychiatric:        Mood and Affect: Mood normal.        Behavior: Behavior normal.        Thought Content: Thought content normal.   LABS:  CBC     Component Value Date/Time   WBC 10.4 08/26/2022 1638   RBC 4.46 08/26/2022 1638   HGB 13.6 08/26/2022 1638   HGB WILL FOLLOW 03/10/2017 1027   HGB 13.7 03/10/2017 1027   HCT 40.8 08/26/2022 1638   HCT WILL FOLLOW 03/10/2017 1027   HCT 40.3 03/10/2017 1027   PLT 267 08/26/2022 1638   PLT WILL FOLLOW 03/10/2017 1027   PLT 322 03/10/2017 1027   MCV 91.5 08/26/2022 1638   MCV WILL FOLLOW 03/10/2017 1027   MCV 89 03/10/2017 1027   MCV 91 12/25/2013 2259   MCH 30.5 08/26/2022 1638   MCHC 33.3 08/26/2022 1638   RDW 11.8 08/26/2022 1638   RDW WILL FOLLOW 03/10/2017 1027   RDW 12.6 03/10/2017 1027   RDW 12.6 12/25/2013 2259   LYMPHSABS 1.7 08/26/2022 1638   LYMPHSABS WILL FOLLOW 03/10/2017 1027   LYMPHSABS 2.9 03/10/2017 1027   LYMPHSABS 2.2 02/27/2013 1311   MONOABS 0.6 08/26/2022 1638   MONOABS 1.6 (H) 02/27/2013 1311   EOSABS 0.1 08/26/2022 1638   EOSABS WILL FOLLOW 03/10/2017 1027   EOSABS 0.6 (H) 03/10/2017 1027   EOSABS 0.0 02/27/2013 1311   BASOSABS 0.1 08/26/2022 1638   BASOSABS WILL FOLLOW 03/10/2017 1027   BASOSABS 0.1 03/10/2017 1027   BASOSABS 0.0 02/27/2013 1311    Hepatic Function      Component Value  Date/Time   PROT 7.6 08/15/2022 2342   PROT 7.0 03/10/2017 1027   PROT  7.5 12/24/2013 1212   ALBUMIN 4.6 08/15/2022 2342   ALBUMIN 4.1 12/24/2013 1212   AST 23 08/15/2022 2342   AST 25 12/24/2013 1212   ALT 14 08/15/2022 2342   ALT 18 12/24/2013 1212   ALKPHOS 47 08/15/2022 2342   ALKPHOS 90 12/24/2013 1212   BILITOT 0.7 08/15/2022 2342   BILITOT 0.5 12/24/2013 1212   BILIDIR 0.1 08/23/2014 1042    Renal Function   Lab Results  Component Value Date   CREATININE 0.65 08/26/2022   CREATININE 0.93 08/15/2022   CREATININE 0.79 08/23/2021    CrCl cannot be calculated (Patient's most recent lab result is older than the maximum 21 days allowed.).   VVS Vascular Lab Studies:  08/26/22 VAS [place] UPPER EXTREMITY VENOUS DUPLEX RIGHT   Right:  No evidence of superficial vein thrombosis in the upper extremity.  Findings consistent with acute deep vein thrombosis involving the right subclavian  vein.    Left:  No evidence of thrombosis in the subclavian.   ASSESSMENT: Location of DVT: Right upper extremity Cause of DVT: provoked by a transient risk factor - PICC line (removed 08/26/22 after DVT diagnosis)  PLAN: -Continue apixaban (Eliquis) 5 mg twice daily. -Expected duration of therapy: 3 months. Therapy started on 08/26/22. -Patient educated on purpose, proper use and potential adverse effects of apixaban (Eliquis). -Discussed importance of taking medication around the same time every day. -Advised patient of medications to avoid (NSAIDs, aspirin doses >100 mg daily). -Educated that Tylenol (acetaminophen) is the preferred analgesic to lower the risk of bleeding. -Advised patient to alert all providers of anticoagulation therapy prior to starting a new medication or having a procedure. -Emphasized importance of monitoring for signs and symptoms of bleeding (abnormal bruising, prolonged bleeding, nose bleeds, bleeding from gums, discolored urine, black tarry stools). -Educated patient  to present to the ED if emergent signs and symptoms of new thrombosis occur. -Patient provided with refills of Eliquis to complete 3 months of treatment.  -Counseled patient that we would ideally delay elective procedures to avoid holding anticoagulation during the first three months of treatment, and she confirms understanding.   Follow up: 2 months in DVT Clinic for final visit  Rebbeca Paul, PharmD, [place], CPP Deep [facility] Thrombosis [facility] Clinical Pharmacist Practitioner Office: [phone]

## 2022-10-01 ENCOUNTER — Other Ambulatory Visit (HOSPITAL_COMMUNITY): Payer: Self-pay | Admitting: Student

## 2022-10-01 DIAGNOSIS — L049 Acute lymphadenitis, unspecified: Secondary | ICD-10-CM

## 2022-10-04 ENCOUNTER — Other Ambulatory Visit (HOSPITAL_COMMUNITY): Payer: 59

## 2022-10-04 ENCOUNTER — Encounter (HOSPITAL_COMMUNITY): Payer: Self-pay

## 2022-10-04 ENCOUNTER — Ambulatory Visit (HOSPITAL_COMMUNITY)
Admission: RE | Admit: 2022-10-04 | Discharge: 2022-10-04 | Disposition: A | Discharge status: Home / Self Care | Payer: 59 | Source: Ambulatory Visit | Attending: Student | Admitting: Student

## 2022-10-04 DIAGNOSIS — L049 Acute lymphadenitis, unspecified: Secondary | ICD-10-CM | POA: Insufficient documentation

## 2022-10-25 ENCOUNTER — Other Ambulatory Visit: Payer: Self-pay | Admitting: Student-PharmD

## 2022-10-25 NOTE — Telephone Encounter (Signed)
Pt will discontinue Eliquis after 3 months of treatment (starter pack + 2 months of refills which have previously been sent).

## 2022-11-15 ENCOUNTER — Other Ambulatory Visit (HOSPITAL_COMMUNITY): Payer: Self-pay | Admitting: Student

## 2022-11-15 DIAGNOSIS — I82621 Acute embolism and thrombosis of deep veins of right upper extremity: Secondary | ICD-10-CM

## 2022-11-20 ENCOUNTER — Ambulatory Visit (HOSPITAL_COMMUNITY)
Admission: RE | Admit: 2022-11-20 | Discharge: 2022-11-20 | Disposition: A | Discharge status: Home / Self Care | Payer: 59 | Source: Ambulatory Visit | Attending: Student | Admitting: Student

## 2022-11-20 DIAGNOSIS — I82621 Acute embolism and thrombosis of deep veins of right upper extremity: Secondary | ICD-10-CM | POA: Insufficient documentation

## 2022-11-28 ENCOUNTER — Encounter (HOSPITAL_COMMUNITY): Payer: Self-pay

## 2022-11-28 ENCOUNTER — Ambulatory Visit (HOSPITAL_COMMUNITY)
Admission: RE | Admit: 2022-11-28 | Discharge: 2022-11-28 | Disposition: A | Discharge status: Home / Self Care | Payer: 59 | Source: Ambulatory Visit | Attending: Vascular Surgery | Admitting: Vascular Surgery

## 2022-11-28 VITALS — BP 107/72 | HR 78

## 2022-11-28 DIAGNOSIS — I82621 Acute embolism and thrombosis of deep veins of right upper extremity: Secondary | ICD-10-CM | POA: Diagnosis not present

## 2022-11-28 NOTE — Patient Instructions (Signed)
You have been discharged from the DVT Clinic! No further follow up in the DVT Clinic is needed.  -Finish your current supply of Eliquis, then discontinue.   Please reach out if any questions come up. [phone] [facility].

## 2022-11-28 NOTE — Progress Notes (Signed)
DVT Clinic Note  Name: Ebony Lewis     MRN: [id number]     DOB: 1985-10-16     Sex: female  PCP: Erskine Emery, NP  Today's Visit: Visit Information: Discharge Visit  Referred to CPP by: Dr. Lenell Antu Reason for referral:  Chief Complaint  Patient presents with   Med Management - DVT   HISTORY OF PRESENT ILLNESS:  Ebony Lewis is a 37 y.o. 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 Patient had been receiving IV antibiotics through a PICC line that was placed 07/31/22 in her R arm for chronic endometritis. Around the same time she was started on high dose Prometrium to try to stop her menstrual cycle, which the patient self-discontinued after DVT diagnosis. Dr. Karin Lieu was consulted in the ED 08/26/22 at diagnosis who recommended catheter removal and anticoagulation with follow up medication management in the DVT Clinic. Her PICC line was removed at that time, and Eliquis was started. Last seen in DVT Clinic 09/30/22 at which time her swelling as improved and Eliquis was continued.   Today patient reports her symptoms have essentially returned to baseline. Denies abnormal bleeding. Does note easy bruising while on Eliquis. Denies missed doses of Eliquis. She had her PCP order a repeat duplex which was completed on 11/20/22 and showed resolution of DVT.   Positive Thrombotic Risk Factors: Recent surgery (within 3 months), Central venous catheterization Bleeding Risk Factors: Anticoagulant therapy  Negative Thrombotic Risk Factors: Previous VTE, Recent trauma (within 3 months), Recent admission to hospital with acute illness (within 3 months), Paralysis, paresis, or recent plaster cast immobilization of lower extremity, Sedentary journey lasting >8 hours within 4 weeks, Bed rest >72 hours within 3 months, Pregnancy, Testosterone therapy, Estrogen therapy, Recent COVID diagnosis (within 3 months), Within 6 weeks  postpartum, Recent cesarean section (within 3 months), Erythropoiesis-stimulating agent, Non-malignant, chronic inflammatory condition, Known thrombophilic condition, Active cancer, Smoking, Obesity, Older age  Rx Insurance Coverage: Commercial Rx Affordability: Eliquis was $10/month  Past Medical History:  Diagnosis Date   Allergy    Anemia    Anxiety    Pt states PTSD s/p post delivery hemorrhage.   Asthma    Blood transfusion without reported diagnosis    BRCA negative 01/2017   Depression    Family history of breast cancer    Genetic testing of female 01/2017   MyRisk neg; IBIS=22.9%/riskscore=34.6%   Heart murmur    Increased risk of breast cancer 01/2017   IBIS=22.9%/riskscore=34.6%   Pneumonia    Renal cyst     Past Surgical History:  Procedure Laterality Date   BREAST BIOPSY Right 08/13/2018   PSEUDO-ANGIOMATOUS STROMAL HYPERPLASIA AND FOCAL ASSOCIATED    DILATION AND CURETTAGE OF UTERUS     EMERGENCY   ENDOSCOPIC TURBINATE REDUCTION Bilateral 11/30/2014   Procedure: ENDOSCOPIC TURBINATE REDUCTION;  Surgeon: Bud Face, MD;  Location: [facility];  Service: ENT;  Laterality: Bilateral;   FRONTAL SINUS EXPLORATION Left 11/30/2014   Procedure: FRONTAL SINUS EXPLORATION;  Surgeon: Bud Face, MD;  Location: [facility];  Service: ENT;  Laterality: Left;   HYSTEROSCOPY WITH D & C N/A 05/08/2022   Procedure: DILATATION AND CURETTAGE /HYSTEROSCOPY;  Surgeon: Conard Novak, MD;  Location: [facility] ORS;  Service: Gynecology;  Laterality: N/A;   IMAGE GUIDED SINUS SURGERY N/A 11/30/2014   Procedure: IMAGE GUIDED SINUS SURGERY;  Surgeon: Bud Face, MD;  Location: [facility];  Service: ENT;  Laterality: N/A;   NASAL SINUS SURGERY  11/2014   SEPTOPLASTY WITH ETHMOIDECTOMY, AND MAXILLARY ANTROSTOMY Bilateral 11/30/2014   Procedure: SEPTOPLASTY WITH ETHMOIDECTOMY, AND MAXILLARY ANTROSTOMY;  Surgeon: Bud Face, MD;  Location: [facility];  Service: ENT;  Laterality: Bilateral;   SPHENOIDECTOMY Bilateral 11/30/2014   Procedure: Selina Cooley;  Surgeon: Bud Face, MD;  Location: [facility];  Service: ENT;  Laterality: Bilateral;   TUBAL LIGATION     XI ROBOT ASSISTED DIAGNOSTIC LAPAROSCOPY N/A 05/08/2022   Procedure: XI ROBOT ASSISTED DIAGNOSTIC LAPAROSCOPY, BILATERAL SALPINGECTOMY;  Surgeon: Conard Novak, MD;  Location: [facility] ORS;  Service: Gynecology;  Laterality: N/A;    Social History   Socioeconomic History   Marital status: Married    Spouse name: ,Onalee Hua   Number of children: 2   Years of education: Not on file   Highest education level: Not on file  Occupational History   Not on file  Tobacco Use   Smoking status: Former    Types: Cigarettes    Quit date: 11/13/2011    Years since quitting: 11.0   Smokeless tobacco: Never  Vaping Use   Vaping Use: Never used  Substance and Sexual Activity   Alcohol use: Yes    Alcohol/week: 1.0 standard drink of alcohol    Types: 1 Glasses of wine per week    Comment: Socially    Drug use: No   Sexual activity: Yes    Partners: Male    Birth control/protection: Surgical    Comment: Fiance  Other Topics Concern   Not on file  Social History Narrative   [employer]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    2 Children- both female (age 40 and 78 months)    Enjoys being with children   Social Determinants of Health   Financial Resource Strain: Not on file  Food Insecurity: Not on file  Transportation Needs: Not on file  Physical Activity: Not on file  Stress: Not on file  Social Connections: Not on file  Intimate Partner Violence: Not on file    Family History  Problem Relation Age of Onset   Heart murmur Father    Hypertension Father    Anxiety disorder Father    Hyperlipidemia Father    Heart disease Father        stent    Coronary artery disease Maternal Grandmother    Fibromyalgia Maternal Grandmother    Cancer Maternal  Grandmother 22       Melanoma, cervical, ovarian   Cancer Mother 73       Lung Cancer--question mets from breast   Diabetes Paternal Grandfather    Heart disease Paternal Grandfather    Diabetes Maternal Grandfather    Hypertension Maternal Grandfather    Cancer Paternal Grandmother        Stage III lung cancer   Multiple sclerosis Paternal Grandmother    Breast cancer Other 5   Cancer Other        blood; tested positive for breast cancer genetic mutation--? type    Allergies as of 11/28/2022 - Review Complete 11/28/2022  Allergen Reaction Noted   Influenza virus vaccine Anaphylaxis 08/15/2022   Bactrim [sulfamethoxazole-trimethoprim] Hives 04/02/2016   Amoxicillin Diarrhea and Rash 10/02/2014    Current Outpatient Medications on File Prior to Encounter  Medication Sig Dispense Refill   aluminum chloride (DRYSOL) 20 % external solution Apply 1 Application topically daily as needed.     apixaban (ELIQUIS) 5 MG TABS tablet  Take 1 tablet (5 mg total) by mouth 2 (two) times daily. Start taking after completion of starter pack. 60 tablet 1   clindamycin (CLEOCIN T) 1 % lotion Apply 1 Application topically daily as needed.     LORazepam (ATIVAN) 1 MG tablet Take 1 mg by mouth daily as needed for anxiety.     ondansetron (ZOFRAN) 4 MG tablet Take 4 mg by mouth daily as needed for nausea or vomiting.     valACYclovir (VALTREX) 1000 MG tablet Take 1,000 mg by mouth daily as needed (cold sores).     VENTOLIN HFA 108 (90 Base) MCG/ACT inhaler INHALE 2 PUFFS INTO THE LUNGS AS NEEDED. 18 g 6   No current facility-administered medications on file prior to encounter.   REVIEW OF SYSTEMS:  Review of Systems  Respiratory:  Negative for shortness of breath.   Cardiovascular:  Negative for chest pain and palpitations.  Neurological:  Positive for tingling. Negative for dizziness.   PHYSICAL EXAMINATION:  Vitals:   11/28/22 1436  BP: 107/72  Pulse: 78  SpO2: 99%   Physical Exam Vitals  reviewed.  Cardiovascular:     Rate and Rhythm: Normal rate.  Pulmonary:     Effort: Pulmonary effort is normal.  Musculoskeletal:        General: No swelling.  Skin:    Findings: No erythema.  Psychiatric:        Mood and Affect: Mood normal.        Behavior: Behavior normal.        Thought Content: Thought content normal.   Villalta Score for Post-Thrombotic Syndrome: Pain: Absent Cramps: Mild Heaviness: Absent Paresthesia: Mild Pruritus: Absent Pretibial Edema: Absent Skin Induration: Absent Hyperpigmentation: Absent Redness: Absent Venous Ectasia: Absent Pain on calf compression: Absent Villalta Preliminary Score: 2 Is venous ulcer present?: No If venous ulcer is present and score is <15, then 15 points total are assigned: Absent Villalta Total Score: 2  LABS:  CBC     Component Value Date/Time   WBC 10.4 08/26/2022 1638   RBC 4.46 08/26/2022 1638   HGB 13.6 08/26/2022 1638   HGB WILL FOLLOW 03/10/2017 1027   HGB 13.7 03/10/2017 1027   HCT 40.8 08/26/2022 1638   HCT WILL FOLLOW 03/10/2017 1027   HCT 40.3 03/10/2017 1027   PLT 267 08/26/2022 1638   PLT WILL FOLLOW 03/10/2017 1027   PLT 322 03/10/2017 1027   MCV 91.5 08/26/2022 1638   MCV WILL FOLLOW 03/10/2017 1027   MCV 89 03/10/2017 1027   MCV 91 12/25/2013 2259   MCH 30.5 08/26/2022 1638   MCHC 33.3 08/26/2022 1638   RDW 11.8 08/26/2022 1638   RDW WILL FOLLOW 03/10/2017 1027   RDW 12.6 03/10/2017 1027   RDW 12.6 12/25/2013 2259   LYMPHSABS 1.7 08/26/2022 1638   LYMPHSABS WILL FOLLOW 03/10/2017 1027   LYMPHSABS 2.9 03/10/2017 1027   LYMPHSABS 2.2 02/27/2013 1311   MONOABS 0.6 08/26/2022 1638   MONOABS 1.6 (H) 02/27/2013 1311   EOSABS 0.1 08/26/2022 1638   EOSABS WILL FOLLOW 03/10/2017 1027   EOSABS 0.6 (H) 03/10/2017 1027   EOSABS 0.0 02/27/2013 1311   BASOSABS 0.1 08/26/2022 1638   BASOSABS WILL FOLLOW 03/10/2017 1027   BASOSABS 0.1 03/10/2017 1027   BASOSABS 0.0 02/27/2013 1311    Hepatic  Function      Component Value Date/Time   PROT 7.6 08/15/2022 2342   PROT 7.0 03/10/2017 1027   PROT 7.5 12/24/2013 1212   ALBUMIN 4.6  08/15/2022 2342   ALBUMIN 4.1 12/24/2013 1212   AST 23 08/15/2022 2342   AST 25 12/24/2013 1212   ALT 14 08/15/2022 2342   ALT 18 12/24/2013 1212   ALKPHOS 47 08/15/2022 2342   ALKPHOS 90 12/24/2013 1212   BILITOT 0.7 08/15/2022 2342   BILITOT 0.5 12/24/2013 1212   BILIDIR 0.1 08/23/2014 1042    Renal Function   Lab Results  Component Value Date   CREATININE 0.65 08/26/2022   CREATININE 0.93 08/15/2022   CREATININE 0.79 08/23/2021    CrCl cannot be calculated (Patient's most recent lab result is older than the maximum 21 days allowed.).   VVS Vascular Lab Studies:  08/26/22 VAS [place] UPPER EXTREMITY VENOUS DUPLEX RIGHT  Summary: Right:  No evidence of superficial vein thrombosis in the upper extremity.  Findings consistent with acute deep vein thrombosis involving the right subclavian  vein.    Left:  No evidence of thrombosis in the subclavian.   11/20/22 VAS [place] UPPER EXTREMITY VENOUS DUPLEX RIGHT  Summary:  Right:  No evidence of deep vein thrombosis in the upper extremity. No evidence of  superficial vein thrombosis in the upper extremity.    Left:  No evidence of thrombosis in the subclavian.   ASSESSMENT: Location of DVT: Right upper extremity Cause of DVT: provoked by a transient risk factor - PICC line (removed 08/26/22 after DVT diagnosis, anticoagulation started 08/26/22). Patient has now completed 3 months of anticoagulation after catheter removal for catheter associated upper extremity DVT. Symptoms have improved. Patient's PCP ordered repeat duplex at patient's request which showed resolution of DVT. Will discontinue anticoagulation at this time.   PLAN: -Patient is discharged from the DVT Clinic. -Discontinue anticoagulation with Eliquis, as patient has completed 3 months of treatment for provoked DVT.  -Counseled patient on  reducing risk factors for developing VTE in the future and to make all of her providers aware going forward that she has a history of DVT.   Follow up: with PCP as otherwise indicated. No need for further follow up in DVT Clinic.   Pervis Hocking, PharmD, [place], CPP Deep Vein Thrombosis Clinic Clinical Pharmacist Practitioner Office: [phone]

## 2023-01-01 ENCOUNTER — Other Ambulatory Visit: Payer: Self-pay

## 2023-01-01 ENCOUNTER — Emergency Department (HOSPITAL_BASED_OUTPATIENT_CLINIC_OR_DEPARTMENT_OTHER)
Admission: EM | Admit: 2023-01-01 | Discharge: 2023-01-01 | Disposition: A | Discharge status: Home / Self Care | Payer: 59 | Attending: Emergency Medicine | Admitting: Emergency Medicine

## 2023-01-01 ENCOUNTER — Emergency Department (HOSPITAL_BASED_OUTPATIENT_CLINIC_OR_DEPARTMENT_OTHER): Payer: 59

## 2023-01-01 ENCOUNTER — Encounter (HOSPITAL_BASED_OUTPATIENT_CLINIC_OR_DEPARTMENT_OTHER): Payer: Self-pay

## 2023-01-01 DIAGNOSIS — S298XXA Other specified injuries of thorax, initial encounter: Secondary | ICD-10-CM

## 2023-01-01 DIAGNOSIS — S299XXA Unspecified injury of thorax, initial encounter: Secondary | ICD-10-CM | POA: Diagnosis present

## 2023-01-01 DIAGNOSIS — S3991XA Unspecified injury of abdomen, initial encounter: Secondary | ICD-10-CM | POA: Insufficient documentation

## 2023-01-01 DIAGNOSIS — S3981XA Other specified injuries of abdomen, initial encounter: Secondary | ICD-10-CM

## 2023-01-01 LAB — COMPREHENSIVE METABOLIC PANEL
ALT: 17 U/L (ref 0–44)
AST: 21 U/L (ref 15–41)
Albumin: 4.6 g/dL (ref 3.5–5.0)
Alkaline Phosphatase: 45 U/L (ref 38–126)
Anion gap: 7 (ref 5–15)
BUN: 9 mg/dL (ref 6–20)
CO2: 25 mmol/L (ref 22–32)
Calcium: 9.4 mg/dL (ref 8.9–10.3)
Chloride: 105 mmol/L (ref 98–111)
Creatinine, Ser: 0.74 mg/dL (ref 0.44–1.00)
GFR, Estimated: >60 mL/min (ref >60)
Glucose, Bld: 94 mg/dL (ref 70–99)
Potassium: 3.6 mmol/L (ref 3.5–5.1)
Sodium: 137 mmol/L (ref 135–145)
Total Bilirubin: 0.5 mg/dL (ref 0.3–1.2)
Total Protein: 7.3 g/dL (ref 6.5–8.1)

## 2023-01-01 LAB — CBC
HCT: 37.6 % (ref 36.0–46.0)
Hemoglobin: 12.6 g/dL (ref 12.0–15.0)
MCH: 30.1 pg (ref 26.0–34.0)
MCHC: 33.5 g/dL (ref 30.0–36.0)
MCV: 89.7 fL (ref 80.0–100.0)
Platelets: 303 10*3/uL (ref 150–400)
RBC: 4.19 MIL/uL (ref 3.87–5.11)
RDW: 12.9 % (ref 11.5–15.5)
WBC: 8.9 10*3/uL (ref 4.0–10.5)
nRBC: 0 % (ref 0.0–0.2)

## 2023-01-01 MED ORDER — MORPHINE SULFATE (PF) 4 MG/ML IV SOLN
6.0000 mg | Freq: Once | INTRAVENOUS | Status: AC
  Administered 2023-01-01: 6 mg via INTRAVENOUS
  Filled 2023-01-01: qty 2

## 2023-01-01 MED ORDER — METHOCARBAMOL 500 MG PO TABS: 500.0000 mg | ORAL_TABLET | Freq: Two times a day (BID) | ORAL | 0 refills | Status: DC

## 2023-01-01 MED ORDER — NAPROXEN 500 MG PO TABS: 500.0000 mg | ORAL_TABLET | Freq: Two times a day (BID) | ORAL | 0 refills | Status: DC

## 2023-01-01 MED ORDER — IOHEXOL 300 MG/ML  SOLN
100.0000 mL | Freq: Once | INTRAMUSCULAR | Status: AC | PRN
  Administered 2023-01-01: 80 mL via INTRAVENOUS

## 2023-01-01 MED ORDER — HYDROCODONE-ACETAMINOPHEN 5-325 MG PO TABS
1.0000 | ORAL_TABLET | Freq: Once | ORAL | Status: AC
  Administered 2023-01-01: 1 via ORAL
  Filled 2023-01-01: qty 1
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
## 2023-03-22 ENCOUNTER — Emergency Department (HOSPITAL_COMMUNITY)
Admission: EM | Admit: 2023-03-22 | Discharge: 2023-03-23 | Discharge status: Against Medical Advice | Payer: No Typology Code available for payment source | Attending: Emergency Medicine | Admitting: Emergency Medicine

## 2023-03-22 ENCOUNTER — Encounter (HOSPITAL_COMMUNITY): Payer: Self-pay

## 2023-03-22 ENCOUNTER — Other Ambulatory Visit: Payer: Self-pay

## 2023-03-22 DIAGNOSIS — Z5321 Procedure and treatment not carried out due to patient leaving prior to being seen by health care provider: Secondary | ICD-10-CM | POA: Insufficient documentation

## 2023-03-22 DIAGNOSIS — S01111A Laceration without foreign body of right eyelid and periocular area, initial encounter: Secondary | ICD-10-CM | POA: Insufficient documentation

## 2023-03-22 DIAGNOSIS — S8011XA Contusion of right lower leg, initial encounter: Secondary | ICD-10-CM | POA: Insufficient documentation

## 2023-03-22 DIAGNOSIS — Y92410 Unspecified street and highway as the place of occurrence of the external cause: Secondary | ICD-10-CM | POA: Diagnosis not present

## 2023-03-22 NOTE — ED Notes (Signed)
No answer  sort thinks she may have left she was threatening to do so a few minutes  before I called her
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
## 2023-03-22 NOTE — ED Notes (Signed)
Pt called for triage, no response at this time

## 2023-06-23 ENCOUNTER — Other Ambulatory Visit: Payer: Self-pay | Admitting: Family

## 2023-06-23 DIAGNOSIS — N281 Cyst of kidney, acquired: Secondary | ICD-10-CM

## 2023-06-30 ENCOUNTER — Ambulatory Visit (INDEPENDENT_AMBULATORY_CARE_PROVIDER_SITE_OTHER): Payer: BC Managed Care – PPO | Admitting: Urology

## 2023-06-30 ENCOUNTER — Encounter: Payer: Self-pay | Admitting: Urology

## 2023-06-30 VITALS — BP 106/66 | HR 82 | Ht 68.5 in | Wt 165.0 lb

## 2023-06-30 DIAGNOSIS — M545 Low back pain, unspecified: Secondary | ICD-10-CM

## 2023-06-30 DIAGNOSIS — R399 Unspecified symptoms and signs involving the genitourinary system: Secondary | ICD-10-CM | POA: Diagnosis not present

## 2023-06-30 NOTE — Progress Notes (Signed)
Chief Complaint: No chief complaint on file.   History of Present Illness:  Ebony Lewis is a 37 y.o. female who is seen in consultation from Erskine Emery, NP for evaluation of right sided back pain.  She is attributing this to a renal cyst which was diagnosed in the past with a renal ultrasound.  The pain has been getting worse recently.  She feels it is worse with positional change, when she wakes up in the middle of night, and does associate the pain at times with numbness, especially when she wakes up at night.  There is no associated change in lower urinary tract symptoms or gross hematuria.  She has taken an extended course of Macrobid suppression for recurrent UTIs, but has not had a UTI in the past couple of years.  There is no significant left sided back or abdominal pain.   Past Medical History:  Past Medical History:  Diagnosis Date   Allergy    Anemia    Anxiety    Pt states PTSD s/p post delivery hemorrhage.   Asthma    Blood transfusion without reported diagnosis    BRCA negative 01/2017   Depression    Family history of breast cancer    Genetic testing of female 01/2017   MyRisk neg; IBIS=22.9%/riskscore=34.6%   Heart murmur    Increased risk of breast cancer 01/2017   IBIS=22.9%/riskscore=34.6%   Pneumonia    Renal cyst     Past Surgical History:  Past Surgical History:  Procedure Laterality Date   BREAST BIOPSY Right 08/13/2018   PSEUDO-ANGIOMATOUS STROMAL HYPERPLASIA AND FOCAL ASSOCIATED    DILATION AND CURETTAGE OF UTERUS     EMERGENCY   ENDOSCOPIC TURBINATE REDUCTION Bilateral 11/30/2014   Procedure: ENDOSCOPIC TURBINATE REDUCTION;  Surgeon: Bud Face, MD;  Location: [facility];  Service: ENT;  Laterality: Bilateral;   FRONTAL SINUS EXPLORATION Left 11/30/2014   Procedure: FRONTAL SINUS EXPLORATION;  Surgeon: Bud Face, MD;  Location: [facility];  Service: ENT;  Laterality: Left;   HYSTEROSCOPY WITH D & C N/A  05/08/2022   Procedure: DILATATION AND CURETTAGE /HYSTEROSCOPY;  Surgeon: Conard Novak, MD;  Location: [facility];  Service: Gynecology;  Laterality: N/A;   IMAGE GUIDED SINUS SURGERY N/A 11/30/2014   Procedure: IMAGE GUIDED SINUS SURGERY;  Surgeon: Bud Face, MD;  Location: [facility];  Service: ENT;  Laterality: N/A;   NASAL SINUS SURGERY  11/2014   SEPTOPLASTY WITH ETHMOIDECTOMY, AND MAXILLARY ANTROSTOMY Bilateral 11/30/2014   Procedure: SEPTOPLASTY WITH ETHMOIDECTOMY, AND MAXILLARY ANTROSTOMY;  Surgeon: Bud Face, MD;  Location: [facility];  Service: ENT;  Laterality: Bilateral;   SPHENOIDECTOMY Bilateral 11/30/2014   Procedure: Selina Cooley;  Surgeon: Bud Face, MD;  Location: [facility];  Service: ENT;  Laterality: Bilateral;   TUBAL LIGATION     XI ROBOT ASSISTED DIAGNOSTIC LAPAROSCOPY N/A 05/08/2022   Procedure: XI ROBOT ASSISTED DIAGNOSTIC LAPAROSCOPY, BILATERAL SALPINGECTOMY;  Surgeon: Conard Novak, MD;  Location: [facility];  Service: Gynecology;  Laterality: N/A;    Allergies:  Allergies  Allergen Reactions   Influenza Virus Vaccine Anaphylaxis   Bactrim [Sulfamethoxazole-Trimethoprim] Hives   Amoxicillin Diarrhea and Rash    Family History:  Family History  Problem Relation Age of Onset   Heart murmur Father    Hypertension Father    Anxiety disorder Father    Hyperlipidemia Father    Heart disease Father        stent  Coronary artery disease Maternal Grandmother    Fibromyalgia Maternal Grandmother    Cancer Maternal Grandmother 22       Melanoma, cervical, ovarian   Cancer Mother 26       Lung Cancer--question mets from breast   Diabetes Paternal Grandfather    Heart disease Paternal Grandfather    Diabetes Maternal Grandfather    Hypertension Maternal Grandfather    Cancer Paternal Grandmother        Stage III lung cancer   Multiple sclerosis Paternal Grandmother    Breast cancer Other 75   Cancer  Other        blood; tested positive for breast cancer genetic mutation--? type    Social History:  Social History   Tobacco Use   Smoking status: Former    Current packs/day: 0.00    Types: Cigarettes    Quit date: 11/13/2011    Years since quitting: 11.6   Smokeless tobacco: Never  Vaping Use   Vaping status: Never Used  Substance Use Topics   Alcohol use: Yes    Alcohol/week: 1.0 standard drink of alcohol    Types: 1 Glasses of wine per week    Comment: Socially    Drug use: No    Review of symptoms:  Constitutional:  Negative for unexplained weight loss, night sweats, fever, chills ENT:  Negative for nose bleeds, sinus pain, painful swallowing CV:  Negative for chest pain, shortness of breath, exercise intolerance, palpitations, loss of consciousness Resp:  Negative for cough, wheezing, shortness of breath GI:  Negative for nausea, vomiting, diarrhea, bloody stools GU:  Positives noted in HPI; otherwise negative for gross hematuria, dysuria, urinary incontinence Neuro:  Negative for seizures, poor balance, limb weakness, slurred speech Psych:  Negative for lack of energy, depression, anxiety Endocrine:  Negative for polydipsia, polyuria, symptoms of hypoglycemia (dizziness, hunger, sweating) Hematologic:  Negative for anemia, purpura, petechia, prolonged or excessive bleeding, use of anticoagulants  Allergic:  Negative for difficulty breathing or choking as a result of exposure to anything; no shellfish allergy; no allergic response (rash/itch) to materials, foods  Physical exam: There were no vitals taken for this visit. GENERAL APPEARANCE:  Well appearing, well developed, well nourished, NAD HEENT: Atraumatic, Normocephalic, oropharynx clear. NECK: Supple without lymphadenopathy or thyromegaly. LUNGS: Clear to auscultation bilaterally. HEART: Regular Rate ABDOMEN: Soft, non-tender, No Masses. EXTREMITIES: Moves all extremities well.  Without clubbing, cyanosis, or  edema. NEUROLOGIC:  Alert and oriented x 3, normal gait, CN II-XII grossly intact.  MENTAL STATUS:  Appropriate. BACK:  Non-tender to palpation.  There is generalized tenderness in her right lower costal margin posteriorly and laterally. SKIN:  Warm, dry and intact.    Results: No results found for this or any previous visit (from the past 24 hours).   I have reviewed referring/prior physicians records  I have reviewed urinalysis  I have reviewed prior urine cultures  I reviewed prior imaging studies--CT images from earlier this year as well as 2023 reviewed.  Renal appearances are normal, without mass or cyst.  No calculi.  Assessment: Right sided back pain.  I do not think that this is related to her kidneys, more than likely musculoskeletal   Plan: -I assured the patient that her kidneys appear normal and I do not think she has a renal source for her back pain  -I do not think she needs to proceed with CT scan which is scheduled to be done in the near future  -I did recommend she  seek a back specialist for further management  -Return as needed

## 2023-07-01 LAB — URINALYSIS, ROUTINE W REFLEX MICROSCOPIC
Bilirubin, UA: NEGATIVE
Glucose, UA: NEGATIVE
Ketones, UA: NEGATIVE
Leukocytes,UA: NEGATIVE
Nitrite, UA: NEGATIVE
Protein,UA: NEGATIVE
RBC, UA: NEGATIVE
Specific Gravity, UA: 1.015 (ref 1.005–1.030)
Urobilinogen, Ur: 0.2 mg/dL (ref 0.2–1.0)
pH, UA: 5.5 (ref 5.0–7.5)

## 2023-10-01 DIAGNOSIS — H8109 Meniere's disease, unspecified ear: Secondary | ICD-10-CM | POA: Insufficient documentation

## 2023-10-01 DIAGNOSIS — N920 Excessive and frequent menstruation with regular cycle: Secondary | ICD-10-CM | POA: Insufficient documentation

## 2023-10-01 DIAGNOSIS — B977 Papillomavirus as the cause of diseases classified elsewhere: Secondary | ICD-10-CM | POA: Insufficient documentation

## 2023-10-01 DIAGNOSIS — N3010 Interstitial cystitis (chronic) without hematuria: Secondary | ICD-10-CM | POA: Insufficient documentation

## 2023-10-01 DIAGNOSIS — J45909 Unspecified asthma, uncomplicated: Secondary | ICD-10-CM | POA: Insufficient documentation

## 2024-03-29 ENCOUNTER — Encounter (HOSPITAL_COMMUNITY): Payer: Self-pay

## 2024-03-29 ENCOUNTER — Emergency Department (HOSPITAL_COMMUNITY): Payer: Self-pay

## 2024-03-29 ENCOUNTER — Other Ambulatory Visit: Payer: Self-pay

## 2024-03-29 ENCOUNTER — Emergency Department (HOSPITAL_COMMUNITY)
Admission: EM | Admit: 2024-03-29 | Discharge: 2024-03-30 | Disposition: A | Discharge status: Home / Self Care | Payer: Self-pay | Attending: Emergency Medicine | Admitting: Emergency Medicine

## 2024-03-29 DIAGNOSIS — J45909 Unspecified asthma, uncomplicated: Secondary | ICD-10-CM | POA: Insufficient documentation

## 2024-03-29 DIAGNOSIS — M79601 Pain in right arm: Secondary | ICD-10-CM | POA: Insufficient documentation

## 2024-03-29 DIAGNOSIS — D72829 Elevated white blood cell count, unspecified: Secondary | ICD-10-CM | POA: Insufficient documentation

## 2024-03-29 LAB — CBC WITH DIFFERENTIAL/PLATELET
Abs Immature Granulocytes: 0.04 K/uL (ref 0.00–0.07)
Basophils Absolute: 0 K/uL (ref 0.0–0.1)
Basophils Relative: 0 %
Eosinophils Absolute: 0 K/uL (ref 0.0–0.5)
Eosinophils Relative: 0 %
HCT: 38.5 % (ref 36.0–46.0)
Hemoglobin: 12.6 g/dL (ref 12.0–15.0)
Immature Granulocytes: 0 %
Lymphocytes Relative: 11 %
Lymphs Abs: 1.4 K/uL (ref 0.7–4.0)
MCH: 30.4 pg (ref 26.0–34.0)
MCHC: 32.7 g/dL (ref 30.0–36.0)
MCV: 92.8 fL (ref 80.0–100.0)
Monocytes Absolute: 1.1 K/uL — ABNORMAL HIGH (ref 0.1–1.0)
Monocytes Relative: 8 %
Neutro Abs: 10.3 K/uL — ABNORMAL HIGH (ref 1.7–7.7)
Neutrophils Relative %: 81 %
Platelets: 306 K/uL (ref 150–400)
RBC: 4.15 MIL/uL (ref 3.87–5.11)
RDW: 11.9 % (ref 11.5–15.5)
WBC: 12.9 K/uL — ABNORMAL HIGH (ref 4.0–10.5)
nRBC: 0 % (ref 0.0–0.2)

## 2024-03-29 LAB — COMPREHENSIVE METABOLIC PANEL WITH GFR
ALT: 14 U/L (ref 0–44)
AST: 20 U/L (ref 15–41)
Albumin: 4.4 g/dL (ref 3.5–5.0)
Alkaline Phosphatase: 68 U/L (ref 38–126)
Anion gap: 10 (ref 5–15)
BUN: 8 mg/dL (ref 6–20)
CO2: 22 mmol/L (ref 22–32)
Calcium: 9 mg/dL (ref 8.9–10.3)
Chloride: 106 mmol/L (ref 98–111)
Creatinine, Ser: 0.84 mg/dL (ref 0.44–1.00)
GFR, Estimated: >60 mL/min (ref >60)
Glucose, Bld: 142 mg/dL — ABNORMAL HIGH (ref 70–99)
Potassium: 3.6 mmol/L (ref 3.5–5.1)
Sodium: 139 mmol/L (ref 135–145)
Total Bilirubin: 0.3 mg/dL (ref 0.0–1.2)
Total Protein: 7.2 g/dL (ref 6.5–8.1)

## 2024-03-29 LAB — D-DIMER, QUANTITATIVE: D-Dimer, Quant: 0.65 ug{FEU}/mL — ABNORMAL HIGH (ref 0.00–0.50)

## 2024-03-29 MED ORDER — SODIUM CHLORIDE 0.9 % IV BOLUS
500.0000 mL | Freq: Once | INTRAVENOUS | Status: AC
  Administered 2024-03-29: 500 mL via INTRAVENOUS

## 2024-03-29 NOTE — ED Triage Notes (Addendum)
 Pt. Arrives pov for right arm pain. Hx of DVT. States that she is having similar symptoms as before and wants to be checked out. Describes pain as a deep aching feel.  Denies CP states that over the past week she has had increased SOB, but also recently DX with a sinus infection.

## 2024-03-29 NOTE — ED Provider Notes (Incomplete)
 [place] EMERGENCY DEPARTMENT AT [facility] Provider Note  CSN: [id number] Arrival date & time: 03/29/24 2135  Chief Complaint(s) Arm Pain  HPI Ebony Lewis is a 38 y.o. female with a past medical history listed below including prior PICC line associated DVT who completed course of anticoagulation is here for 2 to 3 weeks of gradually progressive right arm discomfort similar to symptoms felt with prior DVT.  She denies any swelling.  No trauma.  Denies any associated chest pain.  She does report slight dyspnea on exertion over the past couple days.  Does report that she is currently being treated for upper respiratory infection with azithromycin  and prednisone   The history is provided by the patient.    Past Medical History Past Medical History:  Diagnosis Date  . Allergy   . Anemia   . Anxiety    Pt states PTSD s/p post delivery hemorrhage.  . Asthma   . Blood transfusion without reported diagnosis   . BRCA negative 01/2017  . Depression   . Family history of breast cancer   . Genetic testing of female 01/2017   MyRisk neg; IBIS=22.9%/riskscore=34.6%  . Heart murmur   . Increased risk of breast cancer 01/2017   IBIS=22.9%/riskscore=34.6%  . Pneumonia   . Renal cyst    Patient Active Problem List   Diagnosis Date Noted  . Deep venous thrombosis (HCC) 08/29/2022  . Chronic pelvic pain in female 05/08/2022  . Abnormal urogenital discharge 05/08/2022  . Anxiety 04/03/2017  . LGSIL on Pap smear of cervix 02/03/2017  . Near syncope 03/23/2015  . Temporomandibular joint (TMJ) pain 01/09/2015  . Encounter to establish care 09/19/2014  . Arthralgia 09/19/2014  . Chronic infection of sinus 09/19/2014  . Asthma, moderate persistent 06/28/2014  . Seasonal allergies 06/28/2014  . History of seizures 05/06/2014  . Abdominal pain 08/05/2013  . Edema 08/05/2013  . Family history of ischemic heart disease 08/05/2013  . PALPITATIONS 08/01/2010   Home  Medication(s) Prior to Admission medications   Medication Sig Start Date End Date Taking? Authorizing Provider  azithromycin  (ZITHROMAX ) 250 MG tablet Take by mouth. 03/17/24  Yes [provider]  predniSONE  (DELTASONE ) 20 MG tablet Take 40 mg by mouth daily. 03/17/24  Yes [provider]  aluminum chloride (DRYSOL) 20 % external solution Apply 1 Application topically daily as needed. 09/11/22   [provider]  clindamycin (CLEOCIN T) 1 % lotion Apply 1 Application topically daily as needed. Patient not taking: Reported on 06/30/2023 09/11/22   [provider]  LORazepam  (ATIVAN ) 1 MG tablet Take 1 mg by mouth daily as needed for anxiety. 08/28/22   [provider]  methocarbamol  (ROBAXIN ) 500 MG tablet Take 1 tablet (500 mg total) by mouth 2 (two) times daily. Patient not taking: Reported on 06/30/2023 01/01/23   Charlyn Sora, MD  naproxen  (NAPROSYN ) 500 MG tablet Take 1 tablet (500 mg total) by mouth 2 (two) times daily. 01/01/23   Charlyn Sora, MD  ondansetron  (ZOFRAN ) 4 MG tablet Take 4 mg by mouth daily as needed for nausea or vomiting. 05/08/22   [provider]  valACYclovir (VALTREX) 1000 MG tablet Take 1,000 mg by mouth daily as needed (cold sores). 04/21/18   [provider]  VENTOLIN  HFA 108 (90 Base) MCG/ACT inhaler INHALE 2 PUFFS INTO THE LUNGS AS NEEDED. 06/27/17   Nahser, Aleene PARAS, MD  Allergies Influenza virus vaccine, Bactrim  [sulfamethoxazole -trimethoprim ], and Amoxicillin   Review of Systems Review of Systems As noted in HPI  Physical Exam Vital Signs  I have reviewed the triage vital signs BP 137/75 (BP Location: Right Arm)   Pulse 86   Temp 98.6 F (37 C) (Oral)   Resp 17   SpO2 100%  *** Physical Exam Vitals reviewed.  Constitutional:      General: She is not in acute  distress.    Appearance: She is well-developed. She is not diaphoretic.  HENT:     Head: Normocephalic and atraumatic.     Nose: Nose normal.  Eyes:     General: No scleral icterus.       Right eye: No discharge.        Left eye: No discharge.     Conjunctiva/sclera: Conjunctivae normal.     Pupils: Pupils are equal, round, and reactive to light.  Cardiovascular:     Rate and Rhythm: Normal rate and regular rhythm.     Heart sounds: No murmur heard.    No friction rub. No gallop.  Pulmonary:     Effort: Pulmonary effort is normal. No respiratory distress.     Breath sounds: Normal breath sounds. No stridor. No rales.  Abdominal:     General: There is no distension.     Palpations: Abdomen is soft.     Tenderness: There is no abdominal tenderness.  Musculoskeletal:        General: No tenderness.     Right upper arm: No swelling or tenderness.     Cervical back: Normal range of motion and neck supple.  Skin:    General: Skin is warm and dry.     Findings: No erythema or rash.  Neurological:     Mental Status: She is alert and oriented to person, place, and time.     ED Results and Treatments Labs (all labs ordered are listed, but only abnormal results are displayed) Labs Reviewed  D-DIMER, QUANTITATIVE - Abnormal; Notable for the following components:      Result Value   D-Dimer, Quant 0.65 (*)    All other components within normal limits  CBC WITH DIFFERENTIAL/PLATELET - Abnormal; Notable for the following components:   WBC 12.9 (*)    Neutro Abs 10.3 (*)    Monocytes Absolute 1.1 (*)    All other components within normal limits  COMPREHENSIVE METABOLIC PANEL WITH GFR - Abnormal; Notable for the following components:   Glucose, Bld 142 (*)    All other components within normal limits                                                                                                                         EKG  EKG Interpretation Date/Time:    Ventricular Rate:    PR  Interval:    QRS Duration:    QT Interval:    QTC Calculation:   R Axis:  Text Interpretation:         Radiology No results found.  Medications Ordered in ED Medications  sodium chloride  0.9 % bolus 500 mL (500 mLs Intravenous New Bag/Given 03/29/24 2336)   Procedures Procedures  (including critical care time) Medical Decision Making / ED Course   Medical Decision Making Amount and/or Complexity of Data Reviewed Radiology: ordered.    Right arm pain and dyspnea on exertion differential diagnosis considered  Given patient prior history of DVT, will rule this out.  Given dyspnea on exertion, will assess for possible PE. CBC with leukocytosis which may be related to prednisone  use. CMP without significant electrolyte derangements.  Hyperglycemia without DKA.  No renal insufficiency.  No transaminitis. D-dimer was positive.  CTA ***    Final Clinical Impression(s) / ED Diagnoses Final diagnoses:  None    This chart was dictated using voice recognition software.  Despite best efforts to proofread,  errors can occur which can change the documentation meaning.

## 2024-03-29 NOTE — ED Provider Notes (Signed)
 [place] [facility] AT [facility] Provider Note  CSN: [id number] Arrival date & time: 03/29/24 2135  Chief Complaint(s) Arm Pain  HPI Ebony Lewis is a 38 y.o. female with a past medical history listed below including prior PICC line associated DVT who completed course of anticoagulation is here for 2 to 3 weeks of gradually progressive right arm discomfort similar to symptoms felt with prior DVT.  She denies any swelling.  No trauma.  Denies any associated chest pain.  She does report slight dyspnea on exertion over the past couple days.  Does report that she is currently being treated for upper respiratory infection with azithromycin  and prednisone   The history is provided by the patient.    Past Medical History Past Medical History:  Diagnosis Date   Allergy    Anemia    Anxiety    Pt states PTSD s/p post delivery hemorrhage.   Asthma    Blood transfusion without reported diagnosis    BRCA negative 01/2017   Depression    Family history of breast cancer    Genetic testing of female 01/2017   MyRisk neg; IBIS=22.9%/riskscore=34.6%   Heart murmur    Increased risk of breast cancer 01/2017   IBIS=22.9%/riskscore=34.6%   Pneumonia    Renal cyst    Patient Active Problem List   Diagnosis Date Noted   Deep venous thrombosis (HCC) 08/29/2022   Chronic pelvic pain in female 05/08/2022   Abnormal urogenital discharge 05/08/2022   Anxiety 04/03/2017   LGSIL on Pap smear of cervix 02/03/2017   Near syncope 03/23/2015   Temporomandibular joint (TMJ) pain 01/09/2015   Encounter to establish care 09/19/2014   Arthralgia 09/19/2014   Chronic infection of sinus 09/19/2014   Asthma, moderate persistent 06/28/2014   Seasonal allergies 06/28/2014   History of seizures 05/06/2014   Abdominal pain 08/05/2013   Edema 08/05/2013   Family history of ischemic heart disease 08/05/2013   PALPITATIONS 08/01/2010   Home Medication(s) Prior to Admission  medications   Medication Sig Start Date End Date Taking? Authorizing Provider  azithromycin  (ZITHROMAX ) 250 MG tablet Take by mouth. 03/17/24  Yes [provider]  predniSONE  (DELTASONE ) 20 MG tablet Take 40 mg by mouth daily. 03/17/24  Yes [provider]  aluminum chloride (DRYSOL) 20 % external solution Apply 1 Application topically daily as needed. 09/11/22   [provider]  clindamycin (CLEOCIN T) 1 % lotion Apply 1 Application topically daily as needed. Patient not taking: Reported on 06/30/2023 09/11/22   [provider]  LORazepam  (ATIVAN ) 1 MG tablet Take 1 mg by mouth daily as needed for anxiety. 08/28/22   [provider]  methocarbamol  (ROBAXIN ) 500 MG tablet Take 1 tablet (500 mg total) by mouth 2 (two) times daily. Patient not taking: Reported on 06/30/2023 01/01/23   Ebony Sora, MD  naproxen  (NAPROSYN ) 500 MG tablet Take 1 tablet (500 mg total) by mouth 2 (two) times daily. 01/01/23   Ebony Sora, MD  ondansetron  (ZOFRAN ) 4 MG tablet Take 4 mg by mouth daily as needed for nausea or vomiting. 05/08/22   [provider]  valACYclovir (VALTREX) 1000 MG tablet Take 1,000 mg by mouth daily as needed (cold sores). 04/21/18   [provider]  VENTOLIN  HFA 108 (90 Base) MCG/ACT inhaler INHALE 2 PUFFS INTO THE LUNGS AS NEEDED. 06/27/17   Nahser, Aleene PARAS, MD  Allergies Influenza virus vaccine, Bactrim  [sulfamethoxazole -trimethoprim ], and Amoxicillin   Review of Systems Review of Systems As noted in HPI  Physical Exam Vital Signs  I have reviewed the triage vital signs BP 118/81   Pulse 70   Temp 98.6 F (37 C) (Oral)   Resp 19   SpO2 100%   Physical Exam Vitals reviewed.  Constitutional:      General: She is not in acute distress.    Appearance: She is well-developed. She is not  diaphoretic.  HENT:     Head: Normocephalic and atraumatic.     Nose: Nose normal.  Eyes:     General: No scleral icterus.       Right eye: No discharge.        Left eye: No discharge.     Conjunctiva/sclera: Conjunctivae normal.     Pupils: Pupils are equal, round, and reactive to light.  Cardiovascular:     Rate and Rhythm: Normal rate and regular rhythm.     Heart sounds: No murmur heard.    No friction rub. No gallop.  Pulmonary:     Effort: Pulmonary effort is normal. No respiratory distress.     Breath sounds: Normal breath sounds. No stridor. No rales.  Abdominal:     General: There is no distension.     Palpations: Abdomen is soft.     Tenderness: There is no abdominal tenderness.  Musculoskeletal:        General: No tenderness.     Right upper arm: No swelling or tenderness.     Cervical back: Normal range of motion and neck supple.  Skin:    General: Skin is warm and dry.     Findings: No erythema or rash.  Neurological:     Mental Status: She is alert and oriented to person, place, and time.     ED Results and Treatments Labs (all labs ordered are listed, but only abnormal results are displayed) Labs Reviewed  D-DIMER, QUANTITATIVE - Abnormal; Notable for the following components:      Result Value   D-Dimer, Quant 0.65 (*)    All other components within normal limits  CBC WITH DIFFERENTIAL/PLATELET - Abnormal; Notable for the following components:   WBC 12.9 (*)    Neutro Abs 10.3 (*)    Monocytes Absolute 1.1 (*)    All other components within normal limits  COMPREHENSIVE METABOLIC PANEL WITH GFR - Abnormal; Notable for the following components:   Glucose, Bld 142 (*)    All other components within normal limits                                                                                                                         EKG  EKG Interpretation Date/Time:  Monday March 29 2024 23:49:09 EDT Ventricular Rate:  67 PR Interval:  171 QRS  Duration:  92 QT Interval:  418 QTC Calculation: 442 R Axis:   70  Text Interpretation: Sinus rhythm T wave  changes resolved from 09/08/2019 Confirmed by Trine Likes [phone]) on 03/29/2024 11:58:36 PM       Radiology CT Angio Chest PE W and/or Wo Contrast Result Date: 03/30/2024 CLINICAL DATA:  Positive D-dimer and chest pain EXAM: CT ANGIOGRAPHY CHEST WITH CONTRAST TECHNIQUE: Multidetector CT imaging of the chest was performed using the standard protocol during bolus administration of intravenous contrast. Multiplanar CT image reconstructions and MIPs were obtained to evaluate the vascular anatomy. RADIATION DOSE REDUCTION: This exam was performed according to the departmental dose-optimization program which includes automated exposure control, adjustment of the mA and/or kV according to patient size and/or use of iterative reconstruction technique. CONTRAST:  80mL OMNIPAQUE  IOHEXOL  350 MG/ML SOLN COMPARISON:  01/01/2023 FINDINGS: Cardiovascular: Thoracic aorta and its branches are within normal limits. Pulmonary artery shows a normal branching pattern without intraluminal filling defect to suggest pulmonary embolism. No coronary calcifications are seen. No significant cardiac enlargement is noted. Mediastinum/Nodes: Thoracic inlet is within normal limits. No hilar or mediastinal adenopathy is noted. The esophagus as visualized is within normal limits. Lungs/Pleura: The lungs are well aerated bilaterally. No focal infiltrate or effusion is seen. No parenchymal nodules are noted. Upper Abdomen: Visualized upper abdomen is within normal limits. Musculoskeletal: No chest wall abnormality. No acute or significant osseous findings. Review of the MIP images confirms the above findings. IMPRESSION: No evidence of pulmonary emboli. No acute abnormality noted. Electronically Signed   By: Oneil Devonshire M.D.   On: 03/30/2024 00:45    Medications Ordered in ED Medications  enoxaparin  (LOVENOX ) injection 75 mg  (has no administration in time range)  sodium chloride  0.9 % bolus 500 mL (0 mLs Intravenous Stopped 03/30/24 0100)  iohexol  (OMNIPAQUE ) 350 MG/ML injection 80 mL (80 mLs Intravenous Contrast Given 03/30/24 0017)   Procedures Procedures  (including critical care time) Medical Decision Making / ED Course   Medical Decision Making Amount and/or Complexity of Data Reviewed Labs: ordered. Decision-making details documented in ED Course. Radiology: ordered and independent interpretation performed. Decision-making details documented in ED Course. ECG/medicine tests: ordered and independent interpretation performed. Decision-making details documented in ED Course.  Risk Prescription drug management.    Right arm pain and dyspnea on exertion differential diagnosis considered  Given patient prior history of DVT, will rule this out.  Given dyspnea on exertion, will assess for possible PE. CBC with leukocytosis which may be related to prednisone  use. CMP without significant electrolyte derangements.  Hyperglycemia without DKA.  No renal insufficiency.  No transaminitis. D-dimer was positive.  CTA negative for PE, pneumothorax, pneumonia, pulmonary edema. No wheezing on exam concerning for asthma exacerbation.  Patient will return in the morning for ultrasound to rule out DVT. Given lovenox .    Final Clinical Impression(s) / ED Diagnoses Final diagnoses:  Right arm pain   The patient appears reasonably screened and/or stabilized for discharge and I doubt any other medical condition or other [facility] requiring further screening, evaluation, or treatment in the ED at this time. I have discussed the findings, Dx and Tx plan with the patient/family who expressed understanding and agree(s) with the plan. Discharge instructions discussed at length. The patient/family was given strict return precautions who verbalized understanding of the instructions. No further questions at time of  discharge.  Disposition: Discharge  Condition: Good  ED Discharge Orders          Ordered    UE Venous Duplex       Comments: IMPORTANT PATIENT INSTRUCTIONS: You have been scheduled for an Outpatient Vascular  Study at [facility].  If tomorrow is a Saturday, Sunday or holiday, please go to the [facility] Registration Desk at 11 am tomorrow morning and tell them you are there for a vascular study.  If tomorrow is a weekday (Monday-Friday), please go to the [facility] (address [address], [place]) at 8 am and report to the 4th floor registration Zone A.  Inform registration that you are there for a vascular study.   03/30/24 0059              This chart was dictated using voice recognition software.  Despite best efforts to proofread,  errors can occur which can change the documentation meaning.    Trine Raynell Moder, MD 03/30/24 [phone]

## 2024-03-30 ENCOUNTER — Ambulatory Visit (HOSPITAL_COMMUNITY)
Admission: RE | Admit: 2024-03-30 | Discharge: 2024-03-30 | Disposition: A | Discharge status: Home / Self Care | Payer: Self-pay | Source: Ambulatory Visit | Attending: Emergency Medicine | Admitting: Emergency Medicine

## 2024-03-30 ENCOUNTER — Encounter (HOSPITAL_COMMUNITY): Payer: Self-pay | Admitting: Radiology

## 2024-03-30 DIAGNOSIS — M25511 Pain in right shoulder: Secondary | ICD-10-CM | POA: Insufficient documentation

## 2024-03-30 DIAGNOSIS — Z86718 Personal history of other venous thrombosis and embolism: Secondary | ICD-10-CM | POA: Insufficient documentation

## 2024-03-30 MED ORDER — ENOXAPARIN SODIUM 80 MG/0.8ML IJ SOSY
1.0000 mg/kg | PREFILLED_SYRINGE | Freq: Once | INTRAMUSCULAR | Status: DC
  Filled 2024-03-30: qty 0.75

## 2024-03-30 MED ORDER — IOHEXOL 350 MG/ML SOLN
80.0000 mL | Freq: Once | INTRAVENOUS | Status: AC | PRN
  Administered 2024-03-30: 80 mL via INTRAVENOUS

## 2024-03-30 NOTE — ED Notes (Signed)
 Patient transported to CT

## 2024-06-09 ENCOUNTER — Other Ambulatory Visit: Payer: Self-pay | Admitting: Otolaryngology

## 2024-06-09 DIAGNOSIS — M2420 Disorder of ligament, unspecified site: Secondary | ICD-10-CM

## 2024-06-16 ENCOUNTER — Ambulatory Visit
Admission: EM | Admit: 2024-06-16 | Discharge: 2024-06-16 | Disposition: A | Discharge status: Home / Self Care | Payer: Self-pay

## 2024-06-16 DIAGNOSIS — J40 Bronchitis, not specified as acute or chronic: Secondary | ICD-10-CM

## 2024-06-16 DIAGNOSIS — J329 Chronic sinusitis, unspecified: Secondary | ICD-10-CM

## 2024-06-16 DIAGNOSIS — N730 Acute parametritis and pelvic cellulitis: Secondary | ICD-10-CM | POA: Insufficient documentation

## 2024-06-16 MED ORDER — PREDNISONE 50 MG PO TABS: ORAL_TABLET | ORAL | 0 refills | Status: DC

## 2024-06-16 MED ORDER — DOXYCYCLINE HYCLATE 100 MG PO CAPS: 100.0000 mg | ORAL_CAPSULE | Freq: Two times a day (BID) | ORAL | 0 refills | Status: AC

## 2024-06-16 MED ORDER — GUAIFENESIN ER 600 MG PO TB12: 600.0000 mg | ORAL_TABLET | Freq: Two times a day (BID) | ORAL | 0 refills | Status: AC

## 2024-06-16 MED ORDER — ALBUTEROL SULFATE HFA 108 (90 BASE) MCG/ACT IN AERS: 2.0000 | INHALATION_SPRAY | RESPIRATORY_TRACT | 0 refills | Status: AC | PRN

## 2024-06-16 MED ORDER — BENZONATATE 100 MG PO CAPS: 100.0000 mg | ORAL_CAPSULE | Freq: Three times a day (TID) | ORAL | 0 refills | Status: DC

## 2024-06-16 NOTE — Discharge Instructions (Signed)

## 2024-06-16 NOTE — ED Triage Notes (Signed)
 Pt states cough and congestion with some SOB for the past week.  States she has been taking OTC cold medicines at home.

## 2024-06-16 NOTE — ED Notes (Signed)
 Pt presented to the Urgent Care with shortness of breath as one of their chief complaints. Pt was assessed briefly to determine if patient would be able to wait safely in lobby while waiting to be triaged.

## 2024-06-16 NOTE — ED Provider Notes (Signed)
 [facility]    CSN: [id number] Arrival date & time: 06/16/24  1317      History   Chief Complaint Chief Complaint  Patient presents with   Cough    HPI Ebony Lewis is a 38 y.o. female.   Patient presents today due to 1 week of shortness of breath, purulent nasal drainage, sinus pressure, shortness of breath, chest tightness, cough productive of purulent sputum, and wheezing.  Patient states that she has been using over-the-counter cold medicine and inhaler, which she is out of.  Patient denies fever, chills, nausea, vomiting, or reduced appetite.  The history is provided by the patient.  Cough   Past Medical History:  Diagnosis Date   Allergy    Anemia    Anxiety    Pt states PTSD s/p post delivery hemorrhage.   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   Meniere's disease 10/01/2023   Menorrhagia with regular cycle 10/01/2023   Deep venous thrombosis (HCC) 08/29/2022   Chronic pelvic pain in female 05/08/2022   Abnormal urogenital discharge 05/08/2022   Closed fracture of fifth metacarpal bone of right hand 06/26/2021   Pain in right hand 06/26/2021   Anxiety 04/03/2017   LGSIL on Pap smear of cervix 02/03/2017   Near syncope 03/23/2015   Temporomandibular joint (TMJ) pain 01/09/2015   Encounter to establish care 09/19/2014   Arthralgia 09/19/2014   Chronic infection of sinus 09/19/2014   Asthma, moderate persistent 06/28/2014   Seasonal allergies  06/28/2014   History of seizures 05/06/2014   Abdominal pain 08/05/2013   Edema 08/05/2013   Family history of ischemic heart disease 08/05/2013   PALPITATIONS 08/01/2010    Past Surgical History:  Procedure Laterality Date   BREAST BIOPSY Right 08/13/2018   PSEUDO-ANGIOMATOUS STROMAL HYPERPLASIA AND FOCAL ASSOCIATED    DILATION AND CURETTAGE OF UTERUS     EMERGENCY   ENDOSCOPIC TURBINATE REDUCTION Bilateral 11/30/2014   Procedure: ENDOSCOPIC TURBINATE REDUCTION;  Surgeon: Carolee Hunter, MD;  Location: [facility];  Service: ENT;  Laterality: Bilateral;   FRONTAL SINUS EXPLORATION Left 11/30/2014   Procedure: FRONTAL SINUS EXPLORATION;  Surgeon: Carolee Hunter, MD;  Location: [facility];  Service: ENT;  Laterality: Left;   HYSTEROSCOPY WITH D & C N/A 05/08/2022   Procedure: DILATATION AND CURETTAGE /HYSTEROSCOPY;  Surgeon: Leonce Garnette BIRCH, MD;  Location: [facility];  Service: Gynecology;  Laterality: N/A;   IMAGE GUIDED SINUS SURGERY N/A 11/30/2014   Procedure: IMAGE GUIDED SINUS SURGERY;  Surgeon: Carolee Hunter, MD;  Location: [facility];  Service: ENT;  Laterality: N/A;   NASAL SINUS SURGERY  11/2014   SEPTOPLASTY WITH ETHMOIDECTOMY, AND MAXILLARY ANTROSTOMY Bilateral 11/30/2014   Procedure: SEPTOPLASTY WITH ETHMOIDECTOMY, AND MAXILLARY ANTROSTOMY;  Surgeon: Carolee Hunter, MD;  Location: [facility];  Service: ENT;  Laterality: Bilateral;   SPHENOIDECTOMY Bilateral 11/30/2014   Procedure: CLEONE;  Surgeon: Carolee Hunter, MD;  Location: [facility];  Service: ENT;  Laterality: Bilateral;   TUBAL LIGATION     XI ROBOT ASSISTED DIAGNOSTIC LAPAROSCOPY N/A 05/08/2022   Procedure: XI ROBOT ASSISTED DIAGNOSTIC LAPAROSCOPY, BILATERAL SALPINGECTOMY;  Surgeon: Leonce Garnette BIRCH, MD;  Location: [facility];  Service: Gynecology;  Laterality: N/A;    OB History     Gravida  2   Para  2   Term  2   Preterm      AB      Living   2      SAB      IAB      Ectopic      Multiple      Live Births  2            Home Medications    Prior to Admission medications   Medication Sig Start Date End Date Taking? Authorizing Provider  albuterol  (VENTOLIN  HFA) 108 (90 Base) MCG/ACT inhaler Inhale 2 puffs into the lungs every 4 (four) hours as needed for wheezing or shortness of breath. 06/16/24  Yes Andra Krabbe C, PA-C  Albuterol -Budesonide (AIRSUPRA) 90-80 MCG/ACT AERO See admin instructions. 06/25/23  Yes [provider]  amoxicillin -clavulanate (AUGMENTIN ) 875-125 MG tablet Take 1 tablet by mouth 2 (two) times daily. 01/27/24  Yes [provider]  benzonatate  (TESSALON ) 100 MG capsule Take 1 capsule (100 mg total) by mouth every 8 (eight) hours. 06/16/24  Yes Andra Krabbe BROCKS, PA-C  doxycycline  (VIBRAMYCIN ) 100 MG capsule Take 1 capsule (100 mg total) by mouth 2 (two) times daily for 7 days. 06/16/24 06/23/24 Yes Andra Krabbe BROCKS, PA-C  guaiFENesin  (MUCINEX ) 600 MG 12 hr tablet Take 1 tablet (600 mg total) by mouth 2 (two) times daily for 10 days. 06/16/24 06/26/24 Yes Andra Krabbe C, PA-C  ketorolac  (TORADOL ) 10 MG tablet take 1 tablet by mouth every 6 to 8 hours as needed for pain 12/05/23  Yes [provider]  moxifloxacin (AVELOX) 400 MG tablet Take 400 mg by mouth daily. 04/28/24  Yes [provider]  predniSONE  (DELTASONE ) 50 MG tablet Take 1 tab po daily for 5 days 06/16/24  Yes Andra Krabbe C, PA-C  promethazine -dextromethorphan (PROMETHAZINE -DM) 6.25-15 MG/5ML syrup Take 5 mLs by mouth. 01/11/24  Yes [provider]  traMADol (ULTRAM) 50 MG tablet Take 50 mg by mouth. 12/05/23  Yes [provider]  aluminum chloride (DRYSOL) 20 % external solution Apply 1 Application topically daily as needed. 09/11/22   [provider]  azithromycin  (ZITHROMAX ) 250 MG tablet Take by mouth. 03/17/24   [provider]  cetirizine  (ZYRTEC) 10 MG tablet Take 1 tablet by mouth daily.    [provider]  LORazepam  (ATIVAN ) 1 MG tablet Take 1 mg by mouth daily as needed for anxiety. 08/28/22   [provider]  naproxen  (NAPROSYN ) 500 MG tablet Take 1 tablet (500 mg total) by mouth 2 (two) times daily. 01/01/23   Charlyn Sora, MD  ondansetron  (ZOFRAN ) 4 MG tablet Take 4 mg by mouth daily as needed for nausea or vomiting. 05/08/22   [provider]  valACYclovir (VALTREX) 1000 MG tablet Take 1,000 mg by mouth daily as needed (cold sores). 04/21/18   [provider]  Vitamin E 670 MG (1000 UT) CAPS Take 670 mg by mouth.    [provider]    Family History Family History  Problem Relation Age of Onset   Heart murmur Father    Hypertension Father    Anxiety disorder Father  Hyperlipidemia Father    Heart disease Father        stent    Coronary artery disease Maternal Grandmother    Fibromyalgia Maternal Grandmother    Cancer Maternal Grandmother 22       Melanoma, cervical, ovarian   Cancer Mother 3       Lung Cancer--question mets from breast   Diabetes Paternal Grandfather    Heart disease Paternal Grandfather    Diabetes Maternal Grandfather    Hypertension Maternal Grandfather    Cancer Paternal Grandmother        Stage III lung cancer   Multiple sclerosis Paternal Grandmother    Breast cancer Other 71   Cancer Other        blood; tested positive for breast cancer genetic mutation--? type    Social History Social History   Tobacco Use   Smoking status: Former    Current packs/day: 0.00    Types: Cigarettes    Quit date: 11/13/2011    Years since quitting: 12.6   Smokeless tobacco: Never  Vaping Use   Vaping status: Never Used  Substance Use Topics   Alcohol use: Yes    Alcohol/week: 1.0 standard drink of alcohol    Types: 1 Glasses of wine per week    Comment: Socially    Drug use: No     Allergies   Haemophilus b polysaccharide vaccine,  Influenza virus vaccine, Bactrim  [sulfamethoxazole -trimethoprim ], Clavulanic acid, and Amoxicillin    Review of Systems Review of Systems  Respiratory:  Positive for cough.      Physical Exam Triage Vital Signs ED Triage Vitals  Encounter Vitals Group     BP 06/16/24 1340 108/70     Girls Systolic BP Percentile --      Girls Diastolic BP Percentile --      Boys Systolic BP Percentile --      Boys Diastolic BP Percentile --      Pulse Rate 06/16/24 1340 85     Resp 06/16/24 1340 18     Temp 06/16/24 1340 98.5 F (36.9 C)     Temp Source 06/16/24 1340 Oral     SpO2 06/16/24 1340 96 %     Weight --      Height --      Head Circumference --      Peak Flow --      Pain Score 06/16/24 1338 0     Pain Loc --      Pain Education --      Exclude from Growth Chart --    No data found.  Updated Vital Signs BP 108/70 (BP Location: Right Arm)   Pulse 85   Temp 98.5 F (36.9 C) (Oral)   Resp 18   LMP 06/06/2024   SpO2 96%   Visual Acuity Right Eye Distance:   Left Eye Distance:   Bilateral Distance:    Right Eye Near:   Left Eye Near:    Bilateral Near:     Physical Exam Vitals and nursing note reviewed.  Constitutional:      General: She is not in acute distress.    Appearance: Normal appearance. She is not ill-appearing, toxic-appearing or diaphoretic.  HENT:     Nose:     Right Sinus: Maxillary sinus tenderness and frontal sinus tenderness present.     Left Sinus: Maxillary sinus tenderness and frontal sinus tenderness present.     Comments: Tenderness to palpation of upper lip    Mouth/Throat:  Mouth: Mucous membranes are moist.     Pharynx: Oropharynx is clear. No oropharyngeal exudate or posterior oropharyngeal erythema.  Eyes:     General: No scleral icterus. Cardiovascular:     Rate and Rhythm: Normal rate and regular rhythm.     Heart sounds: Normal heart sounds.  Pulmonary:     Effort: Pulmonary effort is normal. No respiratory distress.      Breath sounds: Normal breath sounds. No wheezing or rhonchi.  Skin:    General: Skin is warm.  Neurological:     Mental Status: She is alert and oriented to person, place, and time.  Psychiatric:        Mood and Affect: Mood normal.        Behavior: Behavior normal.      UC Treatments / Results  Labs (all labs ordered are listed, but only abnormal results are displayed) Labs Reviewed - No data to display  EKG   Radiology No results found.  Procedures Procedures (including critical care time)  Medications Ordered in UC Medications - No data to display  Initial Impression / Assessment and Plan / UC Course  I have reviewed the triage vital signs and the nursing notes.  Pertinent labs & imaging results that were available during my care of the patient were reviewed by me and considered in my medical decision making (see chart for details).    Final Clinical Impressions(s) / UC Diagnoses   Final diagnoses:  Sinobronchitis     Discharge Instructions      You have been diagnosed with a sinus infection today, some are caused by viruses and others are caused by bacteria.  If your symptoms have been going on for less than 7 days it is most likely that you have a viral sinus infection.  Antibiotics will not work for this and it will have to run its course.  Sinus rinses (using a Nettie pot) or saline rinses are helpful as well as pseudoephedrine, and nasal sprays along with ibuprofen  and Tylenol  for pain.  If you have had your symptoms for more than 7 days you most likely have a bacterial infection and will be prescribed antibiotics.  Supportive measures given for viral sinus infections will also be helpful for bacterial infections.  If you are using antibiotics you should start to feel better in 2 to 3 days but it is important that you complete antibiotics in their entirety.    ED Prescriptions     Medication Sig Dispense Auth. Provider   benzonatate  (TESSALON ) 100 MG capsule  Take 1 capsule (100 mg total) by mouth every 8 (eight) hours. 30 capsule Andra Krabbe C, PA-C   guaiFENesin  (MUCINEX ) 600 MG 12 hr tablet Take 1 tablet (600 mg total) by mouth 2 (two) times daily for 10 days. 20 tablet Andra Krabbe C, PA-C   albuterol  (VENTOLIN  HFA) 108 (90 Base) MCG/ACT inhaler Inhale 2 puffs into the lungs every 4 (four) hours as needed for wheezing or shortness of breath. 8 g Navdeep Fessenden C, PA-C   predniSONE  (DELTASONE ) 50 MG tablet Take 1 tab po daily for 5 days 5 tablet Andra Krabbe C, PA-C   doxycycline  (VIBRAMYCIN ) 100 MG capsule Take 1 capsule (100 mg total) by mouth 2 (two) times daily for 7 days. 14 capsule Andra Krabbe BROCKS, PA-C      PDMP not reviewed this encounter.   Andra Krabbe BROCKS, PA-C 06/16/24 1450

## 2024-06-24 ENCOUNTER — Encounter (HOSPITAL_COMMUNITY): Payer: Self-pay

## 2024-06-24 ENCOUNTER — Other Ambulatory Visit: Payer: Self-pay

## 2024-06-24 ENCOUNTER — Emergency Department (HOSPITAL_COMMUNITY): Payer: Self-pay

## 2024-06-24 ENCOUNTER — Emergency Department (HOSPITAL_COMMUNITY)
Admission: EM | Admit: 2024-06-24 | Discharge: 2024-06-24 | Disposition: A | Discharge status: Home / Self Care | Payer: Self-pay | Attending: Emergency Medicine | Admitting: Emergency Medicine

## 2024-06-24 DIAGNOSIS — R42 Dizziness and giddiness: Secondary | ICD-10-CM | POA: Insufficient documentation

## 2024-06-24 LAB — BASIC METABOLIC PANEL WITH GFR
Anion gap: 9 (ref 5–15)
BUN: 9 mg/dL (ref 6–20)
CO2: 25 mmol/L (ref 22–32)
Calcium: 9 mg/dL (ref 8.9–10.3)
Chloride: 105 mmol/L (ref 98–111)
Creatinine, Ser: 0.76 mg/dL (ref 0.44–1.00)
GFR, Estimated: >60 mL/min (ref >60)
Glucose, Bld: 85 mg/dL (ref 70–99)
Potassium: 4.1 mmol/L (ref 3.5–5.1)
Sodium: 138 mmol/L (ref 135–145)

## 2024-06-24 LAB — CBC WITH DIFFERENTIAL/PLATELET
Abs Immature Granulocytes: 0.08 K/uL — ABNORMAL HIGH (ref 0.00–0.07)
Basophils Absolute: 0.1 K/uL (ref 0.0–0.1)
Basophils Relative: 1 %
Eosinophils Absolute: 0.5 K/uL (ref 0.0–0.5)
Eosinophils Relative: 4 %
HCT: 42.1 % (ref 36.0–46.0)
Hemoglobin: 13.9 g/dL (ref 12.0–15.0)
Immature Granulocytes: 1 %
Lymphocytes Relative: 17 %
Lymphs Abs: 1.9 K/uL (ref 0.7–4.0)
MCH: 30.8 pg (ref 26.0–34.0)
MCHC: 33 g/dL (ref 30.0–36.0)
MCV: 93.3 fL (ref 80.0–100.0)
Monocytes Absolute: 1.2 K/uL — ABNORMAL HIGH (ref 0.1–1.0)
Monocytes Relative: 11 %
Neutro Abs: 7.5 K/uL (ref 1.7–7.7)
Neutrophils Relative %: 66 %
Platelets: 316 K/uL (ref 150–400)
RBC: 4.51 MIL/uL (ref 3.87–5.11)
RDW: 12.4 % (ref 11.5–15.5)
WBC: 11.3 K/uL — ABNORMAL HIGH (ref 4.0–10.5)
nRBC: 0 % (ref 0.0–0.2)

## 2024-06-24 LAB — HCG, SERUM, QUALITATIVE: Preg, Serum: NEGATIVE

## 2024-06-24 MED ORDER — MECLIZINE HCL 25 MG PO TABS
25.0000 mg | ORAL_TABLET | Freq: Once | ORAL | Status: AC
  Administered 2024-06-24: 25 mg via ORAL
  Filled 2024-06-24: qty 1

## 2024-06-24 MED ORDER — IOHEXOL 350 MG/ML SOLN
75.0000 mL | Freq: Once | INTRAVENOUS | Status: AC | PRN
  Administered 2024-06-24: 75 mL via INTRAVENOUS

## 2024-06-24 MED ORDER — ONDANSETRON HCL 4 MG/2ML IJ SOLN
4.0000 mg | Freq: Once | INTRAMUSCULAR | Status: AC
  Administered 2024-06-24: 4 mg via INTRAVENOUS
  Filled 2024-06-24: qty 2

## 2024-06-24 MED ORDER — SODIUM CHLORIDE (PF) 0.9 % IJ SOLN
INTRAMUSCULAR | Status: AC
  Filled 2024-06-24: qty 50

## 2024-06-24 MED ORDER — MECLIZINE HCL 12.5 MG PO TABS: 12.5000 mg | ORAL_TABLET | Freq: Three times a day (TID) | ORAL | 0 refills | Status: AC | PRN

## 2024-06-24 MED ORDER — SODIUM CHLORIDE 0.9 % IV BOLUS
1000.0000 mL | Freq: Once | INTRAVENOUS | Status: AC
  Administered 2024-06-24: 1000 mL via INTRAVENOUS

## 2024-06-24 NOTE — ED Provider Notes (Signed)
 [place] EMERGENCY DEPARTMENT AT [facility] Provider Note   CSN: [id number] Arrival date & time: 06/24/24  9277     Patient presents with: Dizziness  HPI Ebony Lewis is a 38 y.o. female with asthma, DVT associated with indwelling PICC line presenting for dizziness.  She states that started yesterday but worse today when she was driving her daughter to school.  She had to stop the car and call her husband.  She endorses an off-balance sensation and denies room spinning.  She states the symptoms have subsided.  Symptoms are worse with turning her head to the right.  Also endorsed some visual blurriness which has also improved.  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  She is wondering if that is contributing in someway to her symptoms today.  Denies vomiting but has felt nauseous.    Dizziness      Prior to Admission medications  Medication Sig Start Date End Date Taking? Authorizing Provider  meclizine (ANTIVERT) 12.5 MG tablet Take 1 tablet (12.5 mg total) by mouth 3 (three) times daily as needed for dizziness. 06/24/24  Yes Fabrizio Filip K, PA-C  albuterol  (VENTOLIN  HFA) 108 (90 Base) MCG/ACT inhaler Inhale 2 puffs into the lungs every 4 (four) hours as needed for wheezing or shortness of breath. 06/16/24   Andra Corean BROCKS, PA-C  Albuterol -Budesonide (AIRSUPRA) 90-80 MCG/ACT AERO See admin instructions. 06/25/23   [provider]  aluminum chloride (DRYSOL) 20 % external solution Apply 1 Application topically daily as needed. 09/11/22   [provider]  amoxicillin -clavulanate (AUGMENTIN ) 875-125 MG tablet Take 1 tablet by mouth 2 (two) times daily. 01/27/24   [provider]  azithromycin  (ZITHROMAX ) 250 MG tablet Take by mouth. 03/17/24   [provider]  benzonatate  (TESSALON ) 100  MG capsule Take 1 capsule (100 mg total) by mouth every 8 (eight) hours. 06/16/24   Andra Corean BROCKS, PA-C  cetirizine (ZYRTEC) 10 MG tablet Take 1 tablet by mouth daily.    [provider]  guaiFENesin  (MUCINEX ) 600 MG 12 hr tablet Take 1 tablet (600 mg total) by mouth 2 (two) times daily for 10 days. 06/16/24 06/26/24  Andra Corean BROCKS, PA-C  ketorolac  (TORADOL ) 10 MG tablet take 1 tablet by mouth every 6 to 8 hours as needed for pain 12/05/23   [provider]  LORazepam  (ATIVAN ) 1 MG tablet Take 1 mg by mouth daily as needed for anxiety. 08/28/22   [provider]  moxifloxacin (AVELOX) 400 MG tablet Take 400 mg by mouth daily. 04/28/24   [provider]  naproxen  (NAPROSYN ) 500 MG tablet Take 1 tablet (500 mg total) by mouth 2 (two) times daily. 01/01/23   Charlyn Sora, MD  ondansetron  (ZOFRAN ) 4 MG tablet Take 4 mg by mouth daily as needed for nausea or vomiting. 05/08/22   [provider]  predniSONE  (DELTASONE ) 50 MG tablet Take 1 tab po daily for 5 days 06/16/24   Andra Corean BROCKS, PA-C  promethazine -dextromethorphan (PROMETHAZINE -DM) 6.25-15 MG/5ML syrup Take 5 mLs by mouth. 01/11/24   [provider]  traMADol (ULTRAM) 50 MG tablet Take 50 mg by mouth. 12/05/23   [provider]  valACYclovir (VALTREX) 1000 MG tablet Take 1,000 mg by mouth daily as needed (cold sores). 04/21/18   [provider]  Vitamin E 670  MG (1000 UT) CAPS Take 670 mg by mouth.    [provider]    Allergies: Haemophilus b polysaccharide vaccine, Influenza virus vaccine, Bactrim  [sulfamethoxazole -trimethoprim ], Clavulanic acid, and Amoxicillin     Review of Systems  Neurological:  Positive for dizziness.    Updated Vital Signs BP 108/77 (BP Location: Right Arm)   Pulse 64   Temp 98 F (36.7 C) (Oral)   Resp 16   Ht 5' 8 (1.727 m)   Wt 72.6 kg   LMP 06/06/2024   SpO2 100%   BMI 24.33 kg/m   Physical Exam Vitals  and nursing note reviewed.  HENT:     Head: Normocephalic and atraumatic.     Mouth/Throat:     Mouth: Mucous membranes are moist.  Eyes:     General:        Right eye: No discharge.        Left eye: No discharge.     Conjunctiva/sclera: Conjunctivae normal.  Cardiovascular:     Rate and Rhythm: Normal rate and regular rhythm.     Pulses: Normal pulses.     Heart sounds: Normal heart sounds.  Pulmonary:     Effort: Pulmonary effort is normal.     Breath sounds: Normal breath sounds.  Abdominal:     General: Abdomen is flat.     Palpations: Abdomen is soft.  Skin:    General: Skin is warm and dry.  Neurological:     General: No focal deficit present.     Comments: GCS 15. Speech is goal oriented. No deficits appreciated to CN III-XII; symmetric eyebrow raise, no facial drooping, tongue midline. Patient has equal grip strength bilaterally with 5/5 strength against resistance in all major muscle groups bilaterally. Sensation to light touch intact. Patient moves extremities without ataxia. Normal finger-nose-finger. Patient ambulatory with steady gait.  Psychiatric:        Mood and Affect: Mood normal.     (all labs ordered are listed, but only abnormal results are displayed) Labs Reviewed  CBC WITH DIFFERENTIAL/PLATELET - Abnormal; Notable for the following components:      Result Value   WBC 11.3 (*)    Monocytes Absolute 1.2 (*)    Abs Immature Granulocytes 0.08 (*)    All other components within normal limits  BASIC METABOLIC PANEL WITH GFR  HCG, SERUM, QUALITATIVE    EKG: None  Radiology: CT Angio Head Neck W WO CM Result Date: 06/24/2024 CLINICAL DATA:  Dizziness EXAM: CT ANGIOGRAPHY HEAD AND NECK WITH AND WITHOUT CONTRAST TECHNIQUE: Multidetector CT imaging of the head and neck was performed using the standard protocol during bolus administration of intravenous contrast. Multiplanar CT image reconstructions and MIPs were obtained to evaluate the vascular anatomy.  Carotid stenosis measurements (when applicable) are obtained utilizing NASCET criteria, using the distal internal carotid diameter as the denominator. RADIATION DOSE REDUCTION: This exam was performed according to the departmental dose-optimization program which includes automated exposure control, adjustment of the mA and/or kV according to patient size and/or use of iterative reconstruction technique. CONTRAST:  75mL OMNIPAQUE  IOHEXOL  350 MG/ML SOLN COMPARISON:  None Available. FINDINGS: CT HEAD: Attenuation in the brain parenchyma is normal. There is no hemorrhage. No acute ischemic changes. No mass lesion. The ventricles are normal. Skull/sinuses/orbits: There is a bipartite right styloid process. This is a normal variation. The left styloid process is normal. CTA NECK: CTA NECK Aortic arch: No proximal vessel stenosis. Right carotid: Normal Left carotid: Normal Right vertebral: Normal Left vertebral:  Normal Soft tissues: No significant abnormality Other comments: None CTA HEAD: CTA HEAD Right anterior circulation: The internal carotid artery is patent without significant stenosis. The anterior and middle cerebral arteries are patent without significant stenosis or proximal branch occlusion. No aneurysm. Left anterior circulation: The internal carotid artery is patent without significant stenosis. The anterior and middle cerebral arteries are patent without significant stenosis or proximal branch occlusion. No aneurysm. Posterior circulation: Both vertebral arteries are patent. There is no significant basilar stenosis. Both posterior cerebral arteries are patent without significant stenosis or proximal branch occlusion. No aneurysm. IMPRESSION: Normal CT arteriogram of the cervical and cerebral arteries There is a bipartite right styloid process which is a normal variation. There is no fracture. Electronically Signed   By: Nancyann Burns M.D.   On: 06/24/2024 10:59     Procedures   Medications Ordered in the  ED  sodium chloride  0.9 % bolus 1,000 mL (0 mLs Intravenous Stopped 06/24/24 1113)  ondansetron  (ZOFRAN ) injection 4 mg (4 mg Intravenous Given 06/24/24 0829)  meclizine (ANTIVERT) tablet 25 mg (25 mg Oral Given 06/24/24 0821)  iohexol  (OMNIPAQUE ) 350 MG/ML injection 75 mL (75 mLs Intravenous Contrast Given 06/24/24 1028)                                    Medical Decision Making Amount and/or Complexity of Data Reviewed Labs: ordered. Radiology: ordered.  Risk Prescription drug management.   38 year old well-appearing female presenting for dizziness.  Exam was unremarkable and there was no neurodeficits.  DD X includes stroke, cerebral venous thrombosis, arrhythmia, electrolyte derangement, other.  CT angio head and neck was normal with a normal-appearing styloid process.  On reassessment she stated her symptoms had improved considerably.  Suspect this could be peripheral vertigo given the positional nature of it.  She looks well, no acute distress hemodynamically stable without focal deficits.  Feel she is safe for discharge.  Sent meclizine to her pharmacy.  Advised her follow-up with her PCP.  Discussed return precautions.  Discharge.     Final diagnoses:  Dizziness    ED Discharge Orders          Ordered    meclizine (ANTIVERT) 12.5 MG tablet  3 times daily PRN        06/24/24 1131               Jury Caserta K, PA-C 06/24/24 1131    Armenta Canning, MD 06/24/24 1244

## 2024-06-24 NOTE — ED Notes (Addendum)
 Pt to CT via stretcher. Remains alert, NAD, calm, interactive, resps e/u, speaking in clear complete sentences. Dizzines remains, slightly improved. Denies pain, nausea, sob.

## 2024-06-24 NOTE — Discharge Instructions (Addendum)
 Evaluation today for your dizziness was overall reassuring.  CT scan was normal.  Please follow-up with your PCP.  I sent meclizine to your pharmacy to help with your dizziness as needed at home.  If your symptoms worsen or change anyway please return to the ED for further evaluation.

## 2024-06-24 NOTE — ED Triage Notes (Signed)
 Pt came in POV.  A&Ox4, c/o extreme dizziness that started yesterday and worse today. Pt states she was recently diagnosed with Eagle Syndrome & has a broken styloid process. Pt states she is worried the broken piece of bone has moved & is causing this dizziness. Denies N&V. NAD

## 2024-06-24 NOTE — ED Notes (Signed)
 Back from CT, no changes, sitting upright using phone, IVF infusing.

## 2024-06-24 NOTE — ED Notes (Signed)
 Lab called, state processing labs/ blood now

## 2024-07-12 ENCOUNTER — Ambulatory Visit
Admission: RE | Admit: 2024-07-12 | Discharge: 2024-07-12 | Disposition: A | Discharge status: Home / Self Care | Payer: Self-pay | Source: Ambulatory Visit | Attending: Family Medicine | Admitting: Family Medicine

## 2024-07-12 VITALS — BP 119/78 | HR 73 | Temp 98.1°F | Resp 16

## 2024-07-12 DIAGNOSIS — J4541 Moderate persistent asthma with (acute) exacerbation: Secondary | ICD-10-CM

## 2024-07-12 DIAGNOSIS — J019 Acute sinusitis, unspecified: Secondary | ICD-10-CM

## 2024-07-12 HISTORY — DX: Disorder of ligament, unspecified site: M24.20

## 2024-07-12 MED ORDER — PREDNISONE 20 MG PO TABS: 40.0000 mg | ORAL_TABLET | Freq: Every day | ORAL | 0 refills | Status: AC

## 2024-07-12 MED ORDER — CEFDINIR 300 MG PO CAPS: 600.0000 mg | ORAL_CAPSULE | Freq: Every day | ORAL | 0 refills | Status: AC

## 2024-07-12 NOTE — Discharge Instructions (Signed)
 Take cefdinir  300 mg--2 capsules together daily for 7 days  Take prednisone  20 mg--2 daily for 5 days  Followup with your primary care

## 2024-07-12 NOTE — ED Triage Notes (Signed)
 Pt reports productive cough (thick, yellow green sputum), sinus pressure, and nasal congestion x 8 days. Denies fevers and chills. Pt reports that she had sinobronchitis diagnosed on 12/03 and completed all med courses - mucinex , doxycycline , benzonatate , albuterol  inhaler, and prednisone . Felt better for a period of days and then she feels like symptoms began again as this feels the same to previous symptoms. Pt has been taking zyrtec, inhlaer, and neb treatments with some relief. Pt is going hoarse and has periods of dyspnea laying down at night.

## 2024-07-12 NOTE — ED Provider Notes (Signed)
 " [facility]    CSN: [id number] Arrival date & time: 07/12/24  1842      History   Chief Complaint Chief Complaint  Patient presents with   Nasal Congestion   Cough    HPI ADREONNA YONTZ is a 38 y.o. female.    Cough  Here for about 2 weeks of cough and nasal congestion and wheezing.  No fever with this bout.  On December 3 she was treated here for sinusitis with antibiotics and also with prednisone  for an asthma exacerbation.  She did improve and then within about 6 days of finishing the antibiotics she started having wheezing and congestion again.  She is allergic to sulfa .  Augmentin  causes diarrhea.  Last menstrual cycle was December 25 Past Medical History:  Diagnosis Date   Allergy    Anemia    Anxiety    Pt states PTSD s/p post delivery hemorrhage.   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   Meniere's disease 10/01/2023   Menorrhagia with regular cycle 10/01/2023   Deep venous thrombosis (HCC) 08/29/2022   Chronic pelvic pain in female 05/08/2022   Abnormal urogenital discharge 05/08/2022   Closed fracture of fifth metacarpal bone of right hand 06/26/2021   Pain in right hand 06/26/2021   Anxiety 04/03/2017   LGSIL on Pap smear of cervix 02/03/2017   Near syncope 03/23/2015   Temporomandibular joint (TMJ) pain 01/09/2015   Encounter to establish care 09/19/2014   Arthralgia 09/19/2014   Chronic infection of sinus  09/19/2014   Asthma, moderate persistent 06/28/2014   Seasonal allergies 06/28/2014   History of seizures 05/06/2014   Abdominal pain 08/05/2013   Edema 08/05/2013   Family history of ischemic heart disease (IHD) 08/05/2013   Palpitations 08/01/2010    Past Surgical History:  Procedure Laterality Date   BREAST BIOPSY Right 08/13/2018   PSEUDO-ANGIOMATOUS STROMAL HYPERPLASIA AND FOCAL ASSOCIATED    DILATION AND CURETTAGE OF UTERUS     EMERGENCY   ENDOSCOPIC TURBINATE REDUCTION Bilateral 11/30/2014   Procedure: ENDOSCOPIC TURBINATE REDUCTION;  Surgeon: Carolee Hunter, MD;  Location: [facility];  Service: ENT;  Laterality: Bilateral;   FRONTAL SINUS EXPLORATION Left 11/30/2014   Procedure: FRONTAL SINUS EXPLORATION;  Surgeon: Carolee Hunter, MD;  Location: [facility];  Service: ENT;  Laterality: Left;   HYSTEROSCOPY WITH D & C N/A 05/08/2022   Procedure: DILATATION AND CURETTAGE /HYSTEROSCOPY;  Surgeon: Leonce Garnette BIRCH, MD;  Location: [facility];  Service: Gynecology;  Laterality: N/A;   IMAGE GUIDED SINUS SURGERY N/A 11/30/2014   Procedure: IMAGE GUIDED SINUS SURGERY;  Surgeon: Carolee Hunter, MD;  Location: [facility];  Service: ENT;  Laterality: N/A;   NASAL SINUS SURGERY  11/2014   SEPTOPLASTY WITH ETHMOIDECTOMY, AND MAXILLARY ANTROSTOMY Bilateral 11/30/2014   Procedure: SEPTOPLASTY WITH ETHMOIDECTOMY, AND MAXILLARY ANTROSTOMY;  Surgeon: Carolee Hunter, MD;  Location: [facility];  Service: ENT;  Laterality: Bilateral;   SPHENOIDECTOMY Bilateral 11/30/2014   Procedure: SPHENOIDECTOMY;  Surgeon: Carolee Hunter, MD;  Location: [facility];  Service: ENT;  Laterality: Bilateral;   TUBAL LIGATION     XI ROBOT ASSISTED DIAGNOSTIC LAPAROSCOPY N/A 05/08/2022   Procedure: XI ROBOT ASSISTED DIAGNOSTIC LAPAROSCOPY, BILATERAL SALPINGECTOMY;  Surgeon: Leonce Garnette BIRCH, MD;  Location: [facility];  Service: Gynecology;  Laterality: N/A;    OB  History     Gravida  2   Para  2   Term  2   Preterm      AB      Living  2      SAB      IAB      Ectopic      Multiple      Live Births  2            Home Medications    Prior to Admission medications  Medication Sig Start Date End Date Taking? Authorizing Provider  albuterol  (PROVENTIL ) (2.5 MG/3ML) 0.083% nebulizer solution as directed. 06/15/24  Yes [provider]  albuterol  (VENTOLIN  HFA) 108 (90 Base) MCG/ACT inhaler Inhale 2 puffs into the lungs every 4 (four) hours as needed for wheezing or shortness of breath. 06/16/24  Yes Andra Krabbe C, PA-C  cefdinir  (OMNICEF ) 300 MG capsule Take 2 capsules (600 mg total) by mouth daily for 7 days. 07/12/24 07/19/24 Yes Okie Bogacz K, MD  cetirizine (ZYRTEC) 10 MG tablet Take 1 tablet by mouth daily.   Yes [provider]  predniSONE  (DELTASONE ) 20 MG tablet Take 2 tablets (40 mg total) by mouth daily with breakfast for 5 days. 07/12/24 07/17/24 Yes Vonna Sharlet POUR, MD  Albuterol -Budesonide (AIRSUPRA) 90-80 MCG/ACT AERO See admin instructions. Patient not taking: Reported on 07/12/2024 06/25/23   [provider]  aluminum chloride (DRYSOL) 20 % external solution Apply 1 Application topically daily as needed. Patient not taking: Reported on 07/12/2024 09/11/22   [provider]  clindamycin (CLEOCIN T) 1 % lotion Apply topically. Patient not taking: Reported on 07/12/2024 10/01/23 09/30/24  [provider]  LORazepam  (ATIVAN ) 1 MG tablet Take 1 mg by mouth daily as needed for anxiety. Patient not taking: Reported on 07/12/2024 08/28/22   [provider]  meclizine  (ANTIVERT ) 12.5 MG tablet Take 1 tablet (12.5 mg total) by mouth 3 (three) times daily as needed for dizziness. Patient not taking: Reported on 07/12/2024 06/24/24   Robinson, John K, PA-C  ondansetron  (ZOFRAN ) 4 MG tablet Take 4 mg by mouth daily as needed for nausea or vomiting. Patient not taking:  Reported on 07/12/2024 05/08/22   [provider]  traMADol (ULTRAM) 50 MG tablet Take 50 mg by mouth. Patient not taking: Reported on 07/12/2024 12/05/23   [provider]  valACYclovir (VALTREX) 1000 MG tablet Take 1,000 mg by mouth daily as needed (cold sores). Patient not taking: Reported on 07/12/2024 04/21/18   [provider]  Vitamin E 670 MG (1000 UT) CAPS Take 670 mg by mouth. Patient not taking: Reported on 07/12/2024    [provider]    Family History Family History  Problem Relation Age of Onset   Heart murmur Father    Hypertension Father    Anxiety disorder Father    Hyperlipidemia Father    Heart disease Father        stent    Coronary artery disease Maternal Grandmother    Fibromyalgia Maternal Grandmother    Cancer Maternal Grandmother 60  Melanoma, cervical, ovarian   Cancer Mother 54       Lung Cancer--question mets from breast   Diabetes Paternal Grandfather    Heart disease Paternal Grandfather    Diabetes Maternal Grandfather    Hypertension Maternal Grandfather    Cancer Paternal Grandmother        Stage III lung cancer   Multiple sclerosis Paternal Grandmother    Breast cancer Other 39   Cancer Other        blood; tested positive for breast cancer genetic mutation--? type    Social History Social History[1]   Allergies   Haemophilus b polysaccharide vaccine, Influenza virus vaccine, Sulfamethoxazole -trimethoprim , Clavulanic acid, and Amoxicillin    Review of Systems Review of Systems  Respiratory:  Positive for cough.      Physical Exam Triage Vital Signs ED Triage Vitals  Encounter Vitals Group     BP 07/12/24 1918 119/78     Girls Systolic BP Percentile --      Girls Diastolic BP Percentile --      Boys Systolic BP Percentile --      Boys Diastolic BP Percentile --      Pulse Rate 07/12/24 1918 73     Resp 07/12/24 1918 16     Temp 07/12/24 1918 98.1 F (36.7 C)     Temp Source 07/12/24  1918 Oral     SpO2 07/12/24 1918 95 %     Weight --      Height --      Head Circumference --      Peak Flow --      Pain Score 07/12/24 1913 5     Pain Loc --      Pain Education --      Exclude from Growth Chart --    No data found.  Updated Vital Signs BP 119/78 (BP Location: Left Arm)   Pulse 73   Temp 98.1 F (36.7 C) (Oral)   Resp 16   LMP 07/08/2024 (Exact Date)   SpO2 95%   Visual Acuity Right Eye Distance:   Left Eye Distance:   Bilateral Distance:    Right Eye Near:   Left Eye Near:    Bilateral Near:     Physical Exam Vitals reviewed.  Constitutional:      General: She is not in acute distress.    Appearance: She is not toxic-appearing.  HENT:     Right Ear: Tympanic membrane and ear canal normal.     Left Ear: Tympanic membrane and ear canal normal.     Nose: Congestion present.     Mouth/Throat:     Mouth: Mucous membranes are moist.     Pharynx: No oropharyngeal exudate or posterior oropharyngeal erythema.  Eyes:     Extraocular Movements: Extraocular movements intact.     Conjunctiva/sclera: Conjunctivae normal.     Pupils: Pupils are equal, round, and reactive to light.  Cardiovascular:     Rate and Rhythm: Normal rate and regular rhythm.     Heart sounds: No murmur heard. Pulmonary:     Effort: No respiratory distress.     Breath sounds: No stridor. No rhonchi or rales.     Comments: There is some expiratory wheezing noted bilaterally, but air movement is good Musculoskeletal:     Cervical back: Neck supple.  Lymphadenopathy:     Cervical: No cervical adenopathy.  Skin:    Capillary Refill: Capillary refill takes less than 2 seconds.     Coloration:  Skin is not jaundiced or pale.  Neurological:     General: No focal deficit present.     Mental Status: She is alert and oriented to person, place, and time.  Psychiatric:        Behavior: Behavior normal.      UC Treatments / Results  Labs (all labs ordered are listed, but only  abnormal results are displayed) Labs Reviewed - No data to display  EKG   Radiology No results found.  Procedures Procedures (including critical care time)  Medications Ordered in UC Medications - No data to display  Initial Impression / Assessment and Plan / UC Course  I have reviewed the triage vital signs and the nursing notes.  Pertinent labs & imaging results that were available during my care of the patient were reviewed by me and considered in my medical decision making (see chart for details).     Prednisone  is sent in to help her asthma exacerbation and this time Omnicef  is sent in for the sinusitis.  She will follow-up with her primary care Final Clinical Impressions(s) / UC Diagnoses   Final diagnoses:  Acute sinusitis, recurrence not specified, unspecified location  Moderate persistent asthma with acute exacerbation     Discharge Instructions      Take cefdinir  300 mg--2 capsules together daily for 7 days  Take prednisone  20 mg--2 daily for 5 days  Followup with your primary care    ED Prescriptions     Medication Sig Dispense Auth. Provider   cefdinir  (OMNICEF ) 300 MG capsule Take 2 capsules (600 mg total) by mouth daily for 7 days. 14 capsule Josee Speece K, MD   predniSONE  (DELTASONE ) 20 MG tablet Take 2 tablets (40 mg total) by mouth daily with breakfast for 5 days. 10 tablet Vonna Mico Spark K, MD      PDMP not reviewed this encounter.    [1]  Social History Tobacco Use   Smoking status: Former    Current packs/day: 0.00    Types: Cigarettes    Quit date: 11/13/2011    Years since quitting: 12.6   Smokeless tobacco: Never  Vaping Use   Vaping status: Never Used  Substance Use Topics   Alcohol use: Yes    Alcohol/week: 1.0 standard drink of alcohol    Types: 1 Glasses of wine per week    Comment: Socially    Drug use: No     Vonna Sharlet POUR, MD 07/12/24 2107  "

## 2024-07-19 ENCOUNTER — Inpatient Hospital Stay: Admission: RE | Admit: 2024-07-19 | Payer: Self-pay | Source: Ambulatory Visit

## 2024-07-26 ENCOUNTER — Inpatient Hospital Stay: Admission: RE | Admit: 2024-07-26 | Payer: Self-pay | Source: Ambulatory Visit

## 2024-08-09 ENCOUNTER — Inpatient Hospital Stay: Admission: RE | Admit: 2024-08-09 | Payer: Self-pay | Source: Ambulatory Visit
